# Patient Record
Sex: Female | Born: 1985
Health system: Southern US, Community
[De-identification: ages and names within clinical notes are randomized; demographics above are authoritative.]

## PROBLEM LIST (undated history)

## (undated) ENCOUNTER — Inpatient Hospital Stay (HOSPITAL_COMMUNITY): Payer: Self-pay

## (undated) DIAGNOSIS — Z87898 Personal history of other specified conditions: Secondary | ICD-10-CM

## (undated) DIAGNOSIS — R011 Cardiac murmur, unspecified: Secondary | ICD-10-CM

## (undated) DIAGNOSIS — Z8759 Personal history of other complications of pregnancy, childbirth and the puerperium: Secondary | ICD-10-CM

## (undated) DIAGNOSIS — Z8659 Personal history of other mental and behavioral disorders: Secondary | ICD-10-CM

## (undated) DIAGNOSIS — Z8742 Personal history of other diseases of the female genital tract: Secondary | ICD-10-CM

## (undated) HISTORY — PX: WISDOM TOOTH EXTRACTION: SHX21

## (undated) HISTORY — PX: DILATION AND CURETTAGE OF UTERUS: SHX78

---
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
## 2005-07-16 ENCOUNTER — Other Ambulatory Visit: Admission: RE | Admit: 2005-07-16 | Discharge: 2005-07-16 | Payer: Self-pay | Admitting: Obstetrics and Gynecology

## 2005-11-02 ENCOUNTER — Inpatient Hospital Stay (HOSPITAL_COMMUNITY): Admission: AD | Admit: 2005-11-02 | Discharge: 2005-11-02 | Payer: Self-pay | Admitting: Obstetrics and Gynecology

## 2005-12-15 ENCOUNTER — Inpatient Hospital Stay (HOSPITAL_COMMUNITY): Admission: AD | Admit: 2005-12-15 | Discharge: 2005-12-15 | Payer: Self-pay | Admitting: Obstetrics and Gynecology

## 2006-01-14 ENCOUNTER — Inpatient Hospital Stay (HOSPITAL_COMMUNITY): Admission: AD | Admit: 2006-01-14 | Discharge: 2006-01-14 | Payer: Self-pay | Admitting: Obstetrics and Gynecology

## 2006-01-20 ENCOUNTER — Inpatient Hospital Stay (HOSPITAL_COMMUNITY): Admission: AD | Admit: 2006-01-20 | Discharge: 2006-01-20 | Payer: Self-pay | Admitting: Obstetrics and Gynecology

## 2006-02-03 ENCOUNTER — Inpatient Hospital Stay (HOSPITAL_COMMUNITY): Admission: AD | Admit: 2006-02-03 | Discharge: 2006-02-06 | Payer: Self-pay | Admitting: Obstetrics and Gynecology

## 2006-02-07 ENCOUNTER — Encounter: Admission: RE | Admit: 2006-02-07 | Discharge: 2006-02-22 | Payer: Self-pay | Admitting: Obstetrics and Gynecology

## 2009-09-07 ENCOUNTER — Inpatient Hospital Stay (HOSPITAL_COMMUNITY): Admission: AD | Admit: 2009-09-07 | Discharge: 2009-09-07 | Payer: Self-pay | Admitting: Obstetrics and Gynecology

## 2009-09-07 ENCOUNTER — Ambulatory Visit: Payer: Self-pay | Admitting: Physician Assistant

## 2009-10-16 ENCOUNTER — Inpatient Hospital Stay (HOSPITAL_COMMUNITY): Admission: AD | Admit: 2009-10-16 | Discharge: 2009-10-17 | Payer: Self-pay | Admitting: Obstetrics & Gynecology

## 2009-10-24 ENCOUNTER — Inpatient Hospital Stay (HOSPITAL_COMMUNITY): Admission: AD | Admit: 2009-10-24 | Discharge: 2009-10-26 | Payer: Self-pay | Admitting: Obstetrics and Gynecology

## 2010-07-05 LAB — CBC
HCT: 26.4 % — ABNORMAL LOW (ref 36.0–46.0)
HCT: 30.6 % — ABNORMAL LOW (ref 36.0–46.0)
Hemoglobin: 9.7 g/dL — ABNORMAL LOW (ref 12.0–15.0)
MCH: 28.4 pg (ref 26.0–34.0)
MCH: 29.6 pg (ref 26.0–34.0)
MCHC: 31.8 g/dL (ref 30.0–36.0)
MCHC: 34.1 g/dL (ref 30.0–36.0)
MCV: 86.8 fL (ref 78.0–100.0)
MCV: 89.1 fL (ref 78.0–100.0)
Platelets: 281 10*3/uL (ref 150–400)
RDW: 14.4 % (ref 11.5–15.5)
RDW: 14.6 % (ref 11.5–15.5)

## 2010-07-05 LAB — SYPHILIS: RPR W/REFLEX TO RPR TITER AND TREPONEMAL ANTIBODIES, TRADITIONAL SCREENING AND DIAGNOSIS ALGORITHM: RPR Ser Ql: NONREACTIVE

## 2010-07-06 LAB — URINALYSIS, ROUTINE W REFLEX MICROSCOPIC
Bilirubin Urine: NEGATIVE
Hgb urine dipstick: NEGATIVE
Ketones, ur: NEGATIVE mg/dL
Protein, ur: NEGATIVE mg/dL
Specific Gravity, Urine: 1.01 (ref 1.005–1.030)
Urobilinogen, UA: 0.2 mg/dL (ref 0.0–1.0)

## 2013-03-02 LAB — OB RESULTS CONSOLE GC/CHLAMYDIA
CHLAMYDIA, DNA PROBE: NEGATIVE
Gonorrhea: NEGATIVE

## 2013-03-02 LAB — OB RESULTS CONSOLE ANTIBODY SCREEN: ANTIBODY SCREEN: NEGATIVE

## 2013-03-02 LAB — OB RESULTS CONSOLE ABO/RH: RH TYPE: POSITIVE

## 2013-03-02 LAB — OB RESULTS CONSOLE HIV ANTIBODY (ROUTINE TESTING): HIV: NONREACTIVE

## 2013-03-02 LAB — OB RESULTS CONSOLE HEPATITIS B SURFACE ANTIGEN: Hepatitis B Surface Ag: NEGATIVE

## 2013-03-02 LAB — OB RESULTS CONSOLE RUBELLA ANTIBODY, IGM: Rubella: IMMUNE

## 2013-03-02 LAB — OB RESULTS CONSOLE RPR: RPR: NONREACTIVE

## 2013-04-19 NOTE — L&D Delivery Note (Signed)
Delivery Note At 9:10 AM a viable female was delivered via Vaginal, Spontaneous Delivery. Pushed x 3 . apgars 9 9 weight pending Placenta status:spontaneously with 3 vessel cord , .  Cord:  with the following complications:none .    Anesthesia: Epidural  Episiotomy: None Lacerations: none Suture Repair: not applicable Est. Blood Loss (mL): 300  Mom to postpartum.  Baby to Couplet care / Skin to Skin.  GREWAL,MICHELLE L 10/02/2013, 9:19 AM

## 2013-09-11 LAB — OB RESULTS CONSOLE GBS: STREP GROUP B AG: POSITIVE

## 2013-09-21 NOTE — H&P (Signed)
Kaitlyn Sawyer is a 28 year old G 3 P 2002 [facility] 10/08/2013 presents for C Section secondary to Breech History of GBBS + . History OB History   No data available     No past medical history on file. No past surgical history on file. Family History: family history is not on file. Social History:  has no tobacco, alcohol, and drug history on file.   Prenatal Transfer Tool  Maternal Diabetes: No Genetic Screening: Normal Maternal Ultrasounds/Referrals: Normal Fetal Ultrasounds or other Referrals:  None Maternal Substance Abuse:  No Significant Maternal Medications:  None Significant Maternal Lab Results:  None Other Comments:  None  ROS    There were no vitals taken for this visit. Maternal Exam:  Introitus: not evaluated.     Physical Exam  Nursing note and vitals reviewed. Constitutional: She appears well-developed.  HENT:  Head: Normocephalic.  Eyes: Pupils are equal, round, and reactive to light.  Neck: Normal range of motion.  Cardiovascular: Normal rate.   Respiratory: Effort normal.  GI: Soft.  Genitourinary: Vagina normal.    Prenatal labs: ABO, Rh:   Antibody:   Rubella:   RPR:    HBsAg:    HIV:    GBS:     Assessment/Plan: IUP at term Breech Primary LTCS   Kaitlyn Sawyer 09/21/2013, 8:30 AM

## 2013-09-22 ENCOUNTER — Encounter (HOSPITAL_COMMUNITY): Payer: Self-pay | Admitting: *Deleted

## 2013-09-22 ENCOUNTER — Inpatient Hospital Stay (HOSPITAL_COMMUNITY)
Admission: AD | Admit: 2013-09-22 | Discharge: 2013-09-22 | Disposition: A | Discharge status: Home / Self Care | Payer: Medicaid Other | Source: Ambulatory Visit | Attending: Obstetrics and Gynecology | Admitting: Obstetrics and Gynecology

## 2013-09-22 DIAGNOSIS — O321XX0 Maternal care for breech presentation, not applicable or unspecified: Secondary | ICD-10-CM | POA: Insufficient documentation

## 2013-09-22 DIAGNOSIS — O479 False labor, unspecified: Secondary | ICD-10-CM | POA: Insufficient documentation

## 2013-09-22 DIAGNOSIS — Z87891 Personal history of nicotine dependence: Secondary | ICD-10-CM | POA: Insufficient documentation

## 2013-09-22 NOTE — MAU Note (Signed)
Contractions since this morning. Were every , thinks they might be closer. No bleeding or leaking.  No problems with preg.  Was closed when last checked.

## 2013-09-25 ENCOUNTER — Encounter (HOSPITAL_COMMUNITY): Payer: Self-pay | Admitting: *Deleted

## 2013-09-25 ENCOUNTER — Telehealth (HOSPITAL_COMMUNITY): Payer: Self-pay | Admitting: *Deleted

## 2013-09-25 NOTE — Telephone Encounter (Signed)
Preadmission screen  

## 2013-10-01 ENCOUNTER — Inpatient Hospital Stay (HOSPITAL_COMMUNITY): Payer: Medicaid Other

## 2013-10-01 ENCOUNTER — Inpatient Hospital Stay (HOSPITAL_COMMUNITY)
Admission: RE | Admit: 2013-10-01 | Discharge: 2013-10-04 | DRG: 775 | Disposition: A | Discharge status: Home / Self Care | Payer: Medicaid Other | Source: Ambulatory Visit | Attending: Obstetrics and Gynecology | Admitting: Obstetrics and Gynecology

## 2013-10-01 ENCOUNTER — Inpatient Hospital Stay (HOSPITAL_COMMUNITY): Admission: RE | Admit: 2013-10-01 | Payer: Self-pay | Source: Ambulatory Visit

## 2013-10-01 ENCOUNTER — Encounter (HOSPITAL_COMMUNITY): Payer: Self-pay

## 2013-10-01 DIAGNOSIS — O99892 Other specified diseases and conditions complicating childbirth: Principal | ICD-10-CM | POA: Diagnosis present

## 2013-10-01 DIAGNOSIS — Z3490 Encounter for supervision of normal pregnancy, unspecified, unspecified trimester: Secondary | ICD-10-CM

## 2013-10-01 DIAGNOSIS — Z87891 Personal history of nicotine dependence: Secondary | ICD-10-CM

## 2013-10-01 DIAGNOSIS — Z22330 Carrier of Group B streptococcus: Secondary | ICD-10-CM

## 2013-10-01 DIAGNOSIS — Z823 Family history of stroke: Secondary | ICD-10-CM

## 2013-10-01 DIAGNOSIS — Z880 Allergy status to penicillin: Secondary | ICD-10-CM

## 2013-10-01 DIAGNOSIS — O9989 Other specified diseases and conditions complicating pregnancy, childbirth and the puerperium: Principal | ICD-10-CM

## 2013-10-01 LAB — CBC
HCT: 32.5 % — ABNORMAL LOW (ref 36.0–46.0)
Hemoglobin: 10.8 g/dL — ABNORMAL LOW (ref 12.0–15.0)
MCH: 28.8 pg (ref 26.0–34.0)
MCHC: 33.2 g/dL (ref 30.0–36.0)
MCV: 86.7 fL (ref 78.0–100.0)
PLATELETS: 308 10*3/uL (ref 150–400)
RBC: 3.75 MIL/uL — AB (ref 3.87–5.11)
RDW: 13 % (ref 11.5–15.5)
WBC: 14.4 10*3/uL — ABNORMAL HIGH (ref 4.0–10.5)

## 2013-10-01 MED ORDER — VANCOMYCIN HCL IN DEXTROSE 1-5 GM/200ML-% IV SOLN
1000.0000 mg | Freq: Two times a day (BID) | INTRAVENOUS | Status: DC
  Administered 2013-10-02: 1000 mg via INTRAVENOUS
  Filled 2013-10-01 (×2): qty 200

## 2013-10-01 MED ORDER — TERBUTALINE SULFATE 1 MG/ML IJ SOLN: 0.2500 mg | Freq: Once | INTRAMUSCULAR | Status: AC | PRN

## 2013-10-01 MED ORDER — OXYTOCIN BOLUS FROM INFUSION
500.0000 mL | INTRAVENOUS | Status: DC
Start: 2013-10-01 — End: 2013-10-02

## 2013-10-01 MED ORDER — FLEET ENEMA 7-19 GM/118ML RE ENEM
1.0000 | ENEMA | RECTAL | Status: DC | PRN
Start: 2013-10-01 — End: 2013-10-02

## 2013-10-01 MED ORDER — ZOLPIDEM TARTRATE 5 MG PO TABS
5.0000 mg | ORAL_TABLET | Freq: Every evening | ORAL | Status: DC | PRN
  Administered 2013-10-01: 5 mg via ORAL
  Filled 2013-10-01: qty 1

## 2013-10-01 MED ORDER — ONDANSETRON HCL 4 MG/2ML IJ SOLN: 4.0000 mg | Freq: Four times a day (QID) | INTRAMUSCULAR | Status: DC | PRN

## 2013-10-01 MED ORDER — LIDOCAINE HCL (PF) 1 % IJ SOLN
30.0000 mL | INTRAMUSCULAR | Status: DC | PRN
  Filled 2013-10-01: qty 30

## 2013-10-01 MED ORDER — LACTATED RINGERS IV SOLN
500.0000 mL | INTRAVENOUS | Status: DC | PRN
Start: 2013-10-01 — End: 2013-10-02

## 2013-10-01 MED ORDER — IBUPROFEN 600 MG PO TABS
600.0000 mg | ORAL_TABLET | Freq: Four times a day (QID) | ORAL | Status: DC | PRN
  Administered 2013-10-02: 600 mg via ORAL
  Filled 2013-10-01: qty 1

## 2013-10-01 MED ORDER — VANCOMYCIN HCL IN DEXTROSE 1-5 GM/200ML-% IV SOLN
1000.0000 mg | Freq: Two times a day (BID) | INTRAVENOUS | Status: DC
  Filled 2013-10-01: qty 200

## 2013-10-01 MED ORDER — BUTORPHANOL TARTRATE 1 MG/ML IJ SOLN
1.0000 mg | INTRAMUSCULAR | Status: DC | PRN
  Administered 2013-10-02: 1 mg via INTRAVENOUS
  Filled 2013-10-01: qty 1

## 2013-10-01 MED ORDER — LACTATED RINGERS IV SOLN
INTRAVENOUS | Status: DC
  Administered 2013-10-01 – 2013-10-02 (×2): via INTRAVENOUS

## 2013-10-01 MED ORDER — OXYTOCIN 40 UNITS IN LACTATED RINGERS INFUSION - SIMPLE MED: 62.5000 mL/h | INTRAVENOUS | Status: DC

## 2013-10-01 MED ORDER — MISOPROSTOL 25 MCG QUARTER TABLET
25.0000 ug | ORAL_TABLET | ORAL | Status: DC | PRN
Start: 2013-10-01 — End: 2013-10-02
  Administered 2013-10-01: 25 ug via VAGINAL
  Filled 2013-10-01: qty 0.25
  Filled 2013-10-01: qty 1

## 2013-10-01 MED ORDER — OXYCODONE-ACETAMINOPHEN 5-325 MG PO TABS: 1.0000 | ORAL_TABLET | ORAL | Status: DC | PRN

## 2013-10-01 MED ORDER — ACETAMINOPHEN 325 MG PO TABS: 650.0000 mg | ORAL_TABLET | ORAL | Status: DC | PRN

## 2013-10-01 MED ORDER — CITRIC ACID-SODIUM CITRATE 334-500 MG/5ML PO SOLN: 30.0000 mL | ORAL | Status: DC | PRN

## 2013-10-01 NOTE — Progress Notes (Signed)
ANTIBIOTIC CONSULT NOTE - INITIAL  Pharmacy Consult for Vancomycin  Indication: GBS positive w/ Penicillin allergy  Allergies  Allergen Reactions  . Penicillins Swelling and Rash    Patient Measurements: Height: 5\' 3"  (160 cm) Weight: 156 lb (70.761 kg) IBW/kg (Calculated) : 52.4   Vital Signs: Temp: 98.5 F (36.9 C) (06/15 1947) Temp src: Oral (06/15 1947) BP: 123/70 mmHg (06/15 1947) Pulse Rate: 92 (06/15 1947) Intake/Output from previous day:   Intake/Output from this shift:    Labs: No results found for this basename: WBC, HGB, PLT, LABCREA, CREATININE,  in the last 72 hours CrCl is unknown because no creatinine reading has been taken. No results found for this basename: VANCOTROUGH, Leodis BinetVANCOPEAK, VANCORANDOM, GENTTROUGH, GENTPEAK, GENTRANDOM, TOBRATROUGH, TOBRAPEAK, TOBRARND, AMIKACINPEAK, AMIKACINTROU, AMIKACIN,  in the last 72 hours   Microbiology: Recent Results (from the past 720 hour(s))  OB RESULTS CONSOLE GBS     Status: None   Collection Time    09/11/13 12:00 AM      Result Value Ref Range Status   GBS Positive   Final    Medical History: Past Medical History  Diagnosis Date  . Vaginal Pap smear, abnormal     cryo- HPV  . Pregnancy induced hypertension     PP eclamptia #2  . Anxiety     hx of meds, none currently  . Depression     hx pp  . Eclampsia, postpartum     Medications:   Assessment: 28 yo admitted for IOL with positive GBS status w/ Penicillin allergy   Plan:  1. Vancomycin 1 gram IV q12h (GBS positive regimen) 2. No further follow up/dose adjustment necessary. Thanks!  Claybon Jabsngel, Kelson Queenan G 10/01/2013,9:17 PM

## 2013-10-02 ENCOUNTER — Inpatient Hospital Stay (HOSPITAL_COMMUNITY): Payer: Medicaid Other | Admitting: Anesthesiology

## 2013-10-02 ENCOUNTER — Encounter (HOSPITAL_COMMUNITY): Payer: Self-pay

## 2013-10-02 ENCOUNTER — Encounter (HOSPITAL_COMMUNITY): Payer: Medicaid Other | Admitting: Anesthesiology

## 2013-10-02 LAB — RPR

## 2013-10-02 SURGERY — Surgical Case
Anesthesia: Regional

## 2013-10-02 MED ORDER — TETANUS-DIPHTH-ACELL PERTUSSIS 5-2.5-18.5 LF-MCG/0.5 IM SUSP: 0.5000 mL | Freq: Once | INTRAMUSCULAR | Status: DC

## 2013-10-02 MED ORDER — LACTATED RINGERS IV SOLN: 500.0000 mL | Freq: Once | INTRAVENOUS | Status: DC

## 2013-10-02 MED ORDER — PRENATAL MULTIVITAMIN CH
1.0000 | ORAL_TABLET | Freq: Every day | ORAL | Status: DC
  Administered 2013-10-03: 1 via ORAL
  Filled 2013-10-02: qty 1

## 2013-10-02 MED ORDER — EPHEDRINE 5 MG/ML INJ
10.0000 mg | INTRAVENOUS | Status: DC | PRN
  Filled 2013-10-02: qty 2

## 2013-10-02 MED ORDER — DIPHENHYDRAMINE HCL 50 MG/ML IJ SOLN: 12.5000 mg | INTRAMUSCULAR | Status: DC | PRN

## 2013-10-02 MED ORDER — IBUPROFEN 600 MG PO TABS
600.0000 mg | ORAL_TABLET | Freq: Four times a day (QID) | ORAL | Status: DC
  Administered 2013-10-02 – 2013-10-04 (×8): 600 mg via ORAL
  Filled 2013-10-02 (×7): qty 1

## 2013-10-02 MED ORDER — BISACODYL 10 MG RE SUPP: 10.0000 mg | Freq: Every day | RECTAL | Status: DC | PRN

## 2013-10-02 MED ORDER — DIBUCAINE 1 % RE OINT: 1.0000 "application " | TOPICAL_OINTMENT | RECTAL | Status: DC | PRN

## 2013-10-02 MED ORDER — FLEET ENEMA 7-19 GM/118ML RE ENEM: 1.0000 | ENEMA | Freq: Every day | RECTAL | Status: DC | PRN

## 2013-10-02 MED ORDER — OXYTOCIN 40 UNITS IN LACTATED RINGERS INFUSION - SIMPLE MED
1.0000 m[IU]/min | INTRAVENOUS | Status: DC
  Filled 2013-10-02: qty 1000

## 2013-10-02 MED ORDER — LANOLIN HYDROUS EX OINT: TOPICAL_OINTMENT | CUTANEOUS | Status: DC | PRN

## 2013-10-02 MED ORDER — ONDANSETRON HCL 4 MG PO TABS: 4.0000 mg | ORAL_TABLET | ORAL | Status: DC | PRN

## 2013-10-02 MED ORDER — ZOLPIDEM TARTRATE 5 MG PO TABS: 5.0000 mg | ORAL_TABLET | Freq: Every evening | ORAL | Status: DC | PRN

## 2013-10-02 MED ORDER — PHENYLEPHRINE 40 MCG/ML (10ML) SYRINGE FOR IV PUSH (FOR BLOOD PRESSURE SUPPORT)
80.0000 ug | PREFILLED_SYRINGE | INTRAVENOUS | Status: DC | PRN
  Filled 2013-10-02: qty 2
  Filled 2013-10-02: qty 10

## 2013-10-02 MED ORDER — BENZOCAINE-MENTHOL 20-0.5 % EX AERO
1.0000 "application " | INHALATION_SPRAY | CUTANEOUS | Status: DC | PRN
  Administered 2013-10-02: 1 via TOPICAL
  Filled 2013-10-02: qty 56

## 2013-10-02 MED ORDER — MEASLES, MUMPS & RUBELLA VAC ~~LOC~~ INJ: 0.5000 mL | INJECTION | Freq: Once | SUBCUTANEOUS | Status: DC

## 2013-10-02 MED ORDER — WITCH HAZEL-GLYCERIN EX PADS: 1.0000 "application " | MEDICATED_PAD | CUTANEOUS | Status: DC | PRN

## 2013-10-02 MED ORDER — TERBUTALINE SULFATE 1 MG/ML IJ SOLN: 0.2500 mg | Freq: Once | INTRAMUSCULAR | Status: DC | PRN

## 2013-10-02 MED ORDER — PHENYLEPHRINE 40 MCG/ML (10ML) SYRINGE FOR IV PUSH (FOR BLOOD PRESSURE SUPPORT)
80.0000 ug | PREFILLED_SYRINGE | INTRAVENOUS | Status: DC | PRN
  Filled 2013-10-02: qty 2

## 2013-10-02 MED ORDER — ONDANSETRON HCL 4 MG/2ML IJ SOLN: 4.0000 mg | INTRAMUSCULAR | Status: DC | PRN

## 2013-10-02 MED ORDER — EPHEDRINE 5 MG/ML INJ
10.0000 mg | INTRAVENOUS | Status: DC | PRN
  Filled 2013-10-02: qty 2
  Filled 2013-10-02: qty 4

## 2013-10-02 MED ORDER — SENNOSIDES-DOCUSATE SODIUM 8.6-50 MG PO TABS
2.0000 | ORAL_TABLET | ORAL | Status: DC
  Administered 2013-10-02 – 2013-10-03 (×2): 2 via ORAL
  Filled 2013-10-02 (×2): qty 2

## 2013-10-02 MED ORDER — MEDROXYPROGESTERONE ACETATE 150 MG/ML IM SUSP: 150.0000 mg | INTRAMUSCULAR | Status: DC | PRN

## 2013-10-02 MED ORDER — IBUPROFEN 600 MG PO TABS: 600.0000 mg | ORAL_TABLET | Freq: Four times a day (QID) | ORAL | Status: DC

## 2013-10-02 MED ORDER — FENTANYL 2.5 MCG/ML BUPIVACAINE 1/10 % EPIDURAL INFUSION (WH - ANES): 14.0000 mL/h | INTRAMUSCULAR | Status: DC | PRN

## 2013-10-02 MED ORDER — FENTANYL 2.5 MCG/ML BUPIVACAINE 1/10 % EPIDURAL INFUSION (WH - ANES)
14.0000 mL/h | INTRAMUSCULAR | Status: DC | PRN
  Administered 2013-10-02: 14 mL/h via EPIDURAL
  Filled 2013-10-02: qty 125

## 2013-10-02 MED ORDER — OXYCODONE-ACETAMINOPHEN 5-325 MG PO TABS: 1.0000 | ORAL_TABLET | ORAL | Status: DC | PRN

## 2013-10-02 MED ORDER — LIDOCAINE HCL (PF) 1 % IJ SOLN
INTRAMUSCULAR | Status: DC | PRN
  Administered 2013-10-02 (×2): 5 mL

## 2013-10-02 MED ORDER — DIPHENHYDRAMINE HCL 25 MG PO CAPS: 25.0000 mg | ORAL_CAPSULE | Freq: Four times a day (QID) | ORAL | Status: DC | PRN

## 2013-10-02 MED ORDER — SIMETHICONE 80 MG PO CHEW: 80.0000 mg | CHEWABLE_TABLET | ORAL | Status: DC | PRN

## 2013-10-02 NOTE — H&P (Signed)
Kaitlyn Sawyer is a 28 y.o. G 3 P 2002 at 2839 w 1 day admitted last night for induction. Started on Vancomycin GBBS+ and resistant to clindamycin. Received 1 cytotec last night. Complaining of significant back pain. Maternal Medical History:  Reason for admission: Contractions.   Contractions: Frequency: regular.    Fetal activity: Perceived fetal activity is normal.    Prenatal complications: no prenatal complications   OB History   Grav Para Term Preterm Abortions TAB SAB Ect Mult Living   3 2 2  0 0 0 0 0 0 2     Past Medical History  Diagnosis Date  . Vaginal Pap smear, abnormal     cryo- HPV  . Pregnancy induced hypertension     PP eclamptia #2  . Anxiety     hx of meds, none currently  . Depression     hx pp  . Eclampsia, postpartum    Past Surgical History  Procedure Laterality Date  . Dilation and curettage of uterus      post partem retained placenta  . Cryotherapy     Family History: family history includes Alcohol abuse in her maternal grandfather; Heart disease in her paternal grandfather; Stroke in her paternal grandmother. Social History:  reports that she has quit smoking. She has never used smokeless tobacco. She reports that she does not drink alcohol or use illicit drugs.   Prenatal Transfer Tool  Maternal Diabetes: No Genetic Screening: Normal Maternal Ultrasounds/Referrals: Normal Fetal Ultrasounds or other Referrals:  None Maternal Substance Abuse:  No Significant Maternal Medications:  None Significant Maternal Lab Results:  None Other Comments:  None  Review of Systems  All other systems reviewed and are negative.   Dilation: 4 Effacement (%): 80 Station: -2 Exam by:: dr Vincente Poligrewal Blood pressure 110/50, pulse 69, temperature 98.5 F (36.9 C), temperature source Oral, resp. rate 18, height 5\' 3"  (1.6 m), weight 70.761 kg (156 lb). Maternal Exam:  Abdomen: Fetal presentation: vertex  Introitus: Normal vulva.   Fetal Exam Fetal State  Assessment: Category I - tracings are normal.     Physical Exam  Nursing note and vitals reviewed. Constitutional: She appears well-developed.  HENT:  Head: Normocephalic.  Eyes: Pupils are equal, round, and reactive to light.  Neck: Normal range of motion.  Cardiovascular: Normal rate.   Respiratory: Effort normal.  GI: Soft.    Prenatal labs: ABO, Rh: AB/Positive/-- (11/14 0000) Antibody: Negative (11/14 0000) Rubella: Immune (11/14 0000) RPR: NON REAC (06/15 2137)  HBsAg: Negative (11/14 0000)  HIV: Non-reactive (11/14 0000)  GBS: Positive (05/26 0000)   Assessment/Plan: IUP at 39 w 1 day Induction of labor per patient request Epidural  Continue vancomycin Pitocin prn GREWAL,MICHELLE L 10/02/2013, 7:42 AM

## 2013-10-02 NOTE — Anesthesia Preprocedure Evaluation (Signed)

## 2013-10-02 NOTE — Anesthesia Postprocedure Evaluation (Signed)
  Anesthesia Post-op Note  Patient: Kaitlyn Sawyer  Procedure(s) Performed: * No procedures listed *  Patient Location: Mother/Baby  Anesthesia Type:Epidural  Level of Consciousness: awake, alert  and oriented  Airway and Oxygen Therapy: Patient Spontanous Breathing  Post-op Pain: none  Post-op Assessment: Post-op Vital signs reviewed, Patient's Cardiovascular Status Stable, Respiratory Function Stable, No headache, No backache, No residual numbness and No residual motor weakness  Post-op Vital Signs: Reviewed and stable  Last Vitals:  Filed Vitals:   10/02/13 1224  BP: 106/58  Pulse: 71  Temp: 36.8 C  Resp: 18    Complications: No apparent anesthesia complications

## 2013-10-02 NOTE — Anesthesia Procedure Notes (Signed)
Epidural Patient location during procedure: OB Start time: 10/02/2013 8:39 AM  Staffing Anesthesiologist: Brayton CavesJACKSON, FREEMAN Performed by: anesthesiologist   Preanesthetic Checklist Completed: patient identified, site marked, surgical consent, pre-op evaluation, timeout performed, IV checked, risks and benefits discussed and monitors and equipment checked  Epidural Patient position: sitting Prep: site prepped and draped and DuraPrep Patient monitoring: continuous pulse ox and blood pressure Approach: midline Injection technique: LOR air  Needle:  Needle type: Tuohy  Needle gauge: 17 G Needle length: 9 cm and 9 Needle insertion depth: 5 cm cm Catheter type: closed end flexible Catheter size: 19 Gauge Catheter at skin depth: 10 cm Test dose: negative  Assessment Events: blood not aspirated, injection not painful, no injection resistance, negative IV test and no paresthesia  Additional Notes Patient identified.  Risk benefits discussed including failed block, incomplete pain control, headache, nerve damage, paralysis, blood pressure changes, nausea, vomiting, reactions to medication both toxic or allergic, and postpartum back pain.  Patient expressed understanding and wished to proceed.  All questions were answered.  Sterile technique used throughout procedure and epidural site dressed with sterile barrier dressing. No paresthesia or other complications noted.The patient did not experience any signs of intravascular injection such as tinnitus or metallic taste in mouth nor signs of intrathecal spread such as rapid motor block. Please see nursing notes for vital signs.

## 2013-10-03 LAB — CBC
HCT: 30.6 % — ABNORMAL LOW (ref 36.0–46.0)
Hemoglobin: 10.2 g/dL — ABNORMAL LOW (ref 12.0–15.0)
MCH: 29.1 pg (ref 26.0–34.0)
MCHC: 33.3 g/dL (ref 30.0–36.0)
MCV: 87.4 fL (ref 78.0–100.0)
Platelets: 274 10*3/uL (ref 150–400)
RBC: 3.5 MIL/uL — AB (ref 3.87–5.11)
RDW: 13 % (ref 11.5–15.5)
WBC: 12.1 10*3/uL — AB (ref 4.0–10.5)

## 2013-10-03 NOTE — Progress Notes (Signed)
Post Partum Day 1 Subjective: no complaints, up ad lib, voiding, tolerating PO and + flatus  Objective: Blood pressure 104/62, pulse 63, temperature 97.9 F (36.6 C), temperature source Oral, resp. rate 18, height 5\' 3"  (1.6 m), weight 156 lb (70.761 kg), SpO2 98.00%, unknown if currently breastfeeding.  Physical Exam:  General: alert and cooperative Lochia: appropriate Uterine Fundus: firm Incision: perineum intact DVT Evaluation: No evidence of DVT seen on physical exam. Negative Homan's sign. No cords or calf tenderness. No significant calf/ankle edema.   Recent Labs  10/01/13 2137 10/03/13 0602  HGB 10.8* 10.2*  HCT 32.5* 30.6*    Assessment/Plan: Plan for discharge tomorrow   LOS: 2 days   CURTIS,CAROL G 10/03/2013, 8:20 AM

## 2013-10-03 NOTE — Progress Notes (Signed)
Patient was referred for history of depression/anxiety.  * Referral screened out by Clinical Social Worker because none of the following criteria appear to apply:  ~ History of anxiety/depression during this pregnancy, or of post-partum depression.  ~ Diagnosis of anxiety and/or depression within last 3 years  ~ History of depression due to pregnancy loss/loss of child  OR  * Patient's symptoms currently being treated with medication and/or therapy.  Please contact the Clinical Social Worker if needs arise, or by the patient's request.  Pt attributes PP depression symptoms she experienced to medical complications after delivery in 2011. She denies any depressed moods prior to pregnancy or since then. Anxiety symptoms were an issue in 2008. Pt states she doing well & has good support at home.   

## 2013-10-03 NOTE — Progress Notes (Signed)
Ur chart review completed.  

## 2013-10-04 MED ORDER — IBUPROFEN 600 MG PO TABS: 600.0000 mg | ORAL_TABLET | Freq: Four times a day (QID) | ORAL | Status: DC

## 2013-10-04 NOTE — Discharge Summary (Signed)
Obstetric Discharge Summary Reason for Admission: induction of labor Prenatal Procedures: ultrasound Intrapartum Procedures: spontaneous vaginal delivery Postpartum Procedures: none Complications-Operative and Postpartum: none Hemoglobin  Date Value Ref Range Status  10/03/2013 10.2* 12.0 - 15.0 g/dL Final     HCT  Date Value Ref Range Status  10/03/2013 30.6* 36.0 - 46.0 % Final    Physical Exam:  General: alert and cooperative Lochia: appropriate Uterine Fundus: firm Incision: perineum intact DVT Evaluation: No evidence of DVT seen on physical exam. Negative Homan's sign. No cords or calf tenderness. No significant calf/ankle edema.  Discharge Diagnoses: Term Pregnancy-delivered  Discharge Information: Date: 10/04/2013 Activity: pelvic rest Diet: routine Medications: PNV and Ibuprofen Condition: stable Instructions: refer to practice specific booklet Discharge to: home   Newborn Data: Live born female  Birth Weight: 7 lb 7.8 oz (3395 g) APGAR: 9, 9  Home with mother.  CURTIS,CAROL G 10/04/2013, 8:37 AM

## 2013-10-04 NOTE — Lactation Note (Signed)
This note was copied from the chart of Kaitlyn Sawyer. Lactation Consultation Note  Patient Name: Kaitlyn Pearletha AlfredKatherine Marsee RUEAV'WToday's Date: 10/04/2013 Reason for consult: Follow-up assessment Per mom my milk is in and I just pumped off the breast the baby didn't feed off. Baby recently fed for 15 mins at 0940. And mom pumped off milk off the 2nd breast. LC reviewed sore nipple and engorgement and tx. Per mom nipples tender , using  Comfort gels with relief. Mother informed of post-discharge support and given phone number to the lactation department, including services for phone call assistance; out-patient appointments; and breastfeeding support group. List of other breastfeeding resources in the community given in the handout. Encouraged mother to call for problems or concerns related to breastfeeding.   Maternal Data Has patient been taught Hand Expression?:  (per mom has been shown )  Feeding Feeding Type:  (recently fed at 0940 ) Length of feed: 15 min (per mom )  LATCH Score/Interventions                Intervention(s): Breastfeeding basics reviewed (see LC note )     Lactation Tools Discussed/Used Tools: Pump;Comfort gels ([facility] pump [facility] mom [facility] ) Breast pump type: Manual   Consult Status Consult Status: Complete Date: 10/04/13 Follow-up type: In-patient    Kathrin Greathouseorio, Margaret Ann 10/04/2013, 10:17 AM

## 2014-02-18 ENCOUNTER — Encounter (HOSPITAL_COMMUNITY): Payer: Self-pay

## 2014-05-16 LAB — OB RESULTS CONSOLE ANTIBODY SCREEN: Antibody Screen: NEGATIVE

## 2014-05-16 LAB — OB RESULTS CONSOLE HIV ANTIBODY (ROUTINE TESTING): HIV: NONREACTIVE

## 2014-05-16 LAB — OB RESULTS CONSOLE GC/CHLAMYDIA
CHLAMYDIA, DNA PROBE: NEGATIVE
Gonorrhea: NEGATIVE

## 2014-05-16 LAB — OB RESULTS CONSOLE RUBELLA ANTIBODY, IGM: Rubella: IMMUNE

## 2014-05-16 LAB — OB RESULTS CONSOLE RPR: RPR: NONREACTIVE

## 2014-05-16 LAB — OB RESULTS CONSOLE HEPATITIS B SURFACE ANTIGEN: Hepatitis B Surface Ag: NEGATIVE

## 2014-05-16 LAB — OB RESULTS CONSOLE ABO/RH: RH Type: POSITIVE

## 2014-11-25 ENCOUNTER — Encounter (HOSPITAL_COMMUNITY): Payer: Self-pay | Admitting: *Deleted

## 2014-11-25 ENCOUNTER — Telehealth (HOSPITAL_COMMUNITY): Payer: Self-pay | Admitting: *Deleted

## 2014-11-25 LAB — OB RESULTS CONSOLE GBS: STREP GROUP B AG: POSITIVE

## 2014-11-25 NOTE — Telephone Encounter (Signed)
Preadmission screen  

## 2014-11-29 ENCOUNTER — Inpatient Hospital Stay (HOSPITAL_COMMUNITY): Admission: RE | Admit: 2014-11-29 | Payer: Medicaid Other | Source: Ambulatory Visit

## 2014-11-30 ENCOUNTER — Inpatient Hospital Stay (HOSPITAL_COMMUNITY): Payer: Medicaid Other | Admitting: Anesthesiology

## 2014-11-30 ENCOUNTER — Inpatient Hospital Stay (HOSPITAL_COMMUNITY)
Admission: RE | Admit: 2014-11-30 | Discharge: 2014-12-01 | DRG: 775 | Disposition: A | Discharge status: Home / Self Care | Payer: Medicaid Other | Source: Ambulatory Visit | Attending: Obstetrics and Gynecology | Admitting: Obstetrics and Gynecology

## 2014-11-30 ENCOUNTER — Encounter (HOSPITAL_COMMUNITY): Payer: Self-pay

## 2014-11-30 DIAGNOSIS — Z823 Family history of stroke: Secondary | ICD-10-CM

## 2014-11-30 DIAGNOSIS — Z3490 Encounter for supervision of normal pregnancy, unspecified, unspecified trimester: Secondary | ICD-10-CM

## 2014-11-30 DIAGNOSIS — O99824 Streptococcus B carrier state complicating childbirth: Secondary | ICD-10-CM | POA: Diagnosis present

## 2014-11-30 DIAGNOSIS — Z3A39 39 weeks gestation of pregnancy: Secondary | ICD-10-CM | POA: Diagnosis present

## 2014-11-30 DIAGNOSIS — Z87891 Personal history of nicotine dependence: Secondary | ICD-10-CM

## 2014-11-30 DIAGNOSIS — Z8249 Family history of ischemic heart disease and other diseases of the circulatory system: Secondary | ICD-10-CM

## 2014-11-30 LAB — CBC
HEMATOCRIT: 28 % — AB (ref 36.0–46.0)
HEMOGLOBIN: 9 g/dL — AB (ref 12.0–15.0)
MCH: 26.1 pg (ref 26.0–34.0)
MCHC: 32.1 g/dL (ref 30.0–36.0)
MCV: 81.2 fL (ref 78.0–100.0)
PLATELETS: 323 10*3/uL (ref 150–400)
RBC: 3.45 MIL/uL — AB (ref 3.87–5.11)
RDW: 13.9 % (ref 11.5–15.5)
WBC: 10.5 10*3/uL (ref 4.0–10.5)

## 2014-11-30 LAB — TYPE AND SCREEN
ABO/RH(D): AB POS
ANTIBODY SCREEN: NEGATIVE

## 2014-11-30 LAB — ABO/RH: ABO/RH(D): AB POS

## 2014-11-30 LAB — RPR: RPR: NONREACTIVE

## 2014-11-30 MED ORDER — DIBUCAINE 1 % RE OINT: 1.0000 "application " | TOPICAL_OINTMENT | RECTAL | Status: DC | PRN

## 2014-11-30 MED ORDER — ONDANSETRON HCL 4 MG PO TABS: 4.0000 mg | ORAL_TABLET | ORAL | Status: DC | PRN

## 2014-11-30 MED ORDER — CITRIC ACID-SODIUM CITRATE 334-500 MG/5ML PO SOLN: 30.0000 mL | ORAL | Status: DC | PRN

## 2014-11-30 MED ORDER — PHENYLEPHRINE 40 MCG/ML (10ML) SYRINGE FOR IV PUSH (FOR BLOOD PRESSURE SUPPORT)
PREFILLED_SYRINGE | INTRAVENOUS | Status: AC
  Filled 2014-11-30: qty 20

## 2014-11-30 MED ORDER — MEASLES, MUMPS & RUBELLA VAC ~~LOC~~ INJ
0.5000 mL | INJECTION | Freq: Once | SUBCUTANEOUS | Status: DC
  Filled 2014-11-30: qty 0.5

## 2014-11-30 MED ORDER — ONDANSETRON HCL 4 MG/2ML IJ SOLN
4.0000 mg | INTRAMUSCULAR | Status: DC | PRN
Start: 2014-11-30 — End: 2014-12-01

## 2014-11-30 MED ORDER — FLEET ENEMA 7-19 GM/118ML RE ENEM: 1.0000 | ENEMA | RECTAL | Status: DC | PRN

## 2014-11-30 MED ORDER — SIMETHICONE 80 MG PO CHEW: 80.0000 mg | CHEWABLE_TABLET | ORAL | Status: DC | PRN

## 2014-11-30 MED ORDER — ACETAMINOPHEN 325 MG PO TABS: 650.0000 mg | ORAL_TABLET | ORAL | Status: DC | PRN

## 2014-11-30 MED ORDER — LIDOCAINE HCL (PF) 1 % IJ SOLN
30.0000 mL | INTRAMUSCULAR | Status: DC | PRN
  Filled 2014-11-30: qty 30

## 2014-11-30 MED ORDER — OXYCODONE-ACETAMINOPHEN 5-325 MG PO TABS: 1.0000 | ORAL_TABLET | ORAL | Status: DC | PRN

## 2014-11-30 MED ORDER — PHENYLEPHRINE 40 MCG/ML (10ML) SYRINGE FOR IV PUSH (FOR BLOOD PRESSURE SUPPORT)
80.0000 ug | PREFILLED_SYRINGE | INTRAVENOUS | Status: DC | PRN
  Filled 2014-11-30: qty 2

## 2014-11-30 MED ORDER — FENTANYL 2.5 MCG/ML BUPIVACAINE 1/10 % EPIDURAL INFUSION (WH - ANES): 14.0000 mL/h | INTRAMUSCULAR | Status: DC | PRN

## 2014-11-30 MED ORDER — METHYLERGONOVINE MALEATE 0.2 MG/ML IJ SOLN
0.2000 mg | Freq: Once | INTRAMUSCULAR | Status: AC
  Administered 2014-11-30: 0.2 mg via INTRAMUSCULAR

## 2014-11-30 MED ORDER — WITCH HAZEL-GLYCERIN EX PADS: 1.0000 "application " | MEDICATED_PAD | CUTANEOUS | Status: DC | PRN

## 2014-11-30 MED ORDER — OXYCODONE-ACETAMINOPHEN 5-325 MG PO TABS: 2.0000 | ORAL_TABLET | ORAL | Status: DC | PRN

## 2014-11-30 MED ORDER — DIPHENHYDRAMINE HCL 50 MG/ML IJ SOLN: 12.5000 mg | INTRAMUSCULAR | Status: DC | PRN

## 2014-11-30 MED ORDER — BENZOCAINE-MENTHOL 20-0.5 % EX AERO: 1.0000 "application " | INHALATION_SPRAY | CUTANEOUS | Status: DC | PRN

## 2014-11-30 MED ORDER — ONDANSETRON HCL 4 MG/2ML IJ SOLN: 4.0000 mg | Freq: Four times a day (QID) | INTRAMUSCULAR | Status: DC | PRN

## 2014-11-30 MED ORDER — METHYLERGONOVINE MALEATE 0.2 MG/ML IJ SOLN
INTRAMUSCULAR | Status: AC
  Filled 2014-11-30: qty 1

## 2014-11-30 MED ORDER — EPHEDRINE 5 MG/ML INJ
10.0000 mg | INTRAVENOUS | Status: DC | PRN
  Filled 2014-11-30: qty 2

## 2014-11-30 MED ORDER — LANOLIN HYDROUS EX OINT: TOPICAL_OINTMENT | CUTANEOUS | Status: DC | PRN

## 2014-11-30 MED ORDER — MEDROXYPROGESTERONE ACETATE 150 MG/ML IM SUSP: 150.0000 mg | INTRAMUSCULAR | Status: DC | PRN

## 2014-11-30 MED ORDER — FENTANYL 2.5 MCG/ML BUPIVACAINE 1/10 % EPIDURAL INFUSION (WH - ANES)
INTRAMUSCULAR | Status: AC
  Filled 2014-11-30: qty 125

## 2014-11-30 MED ORDER — OXYTOCIN 40 UNITS IN LACTATED RINGERS INFUSION - SIMPLE MED
1.0000 m[IU]/min | INTRAVENOUS | Status: DC
  Administered 2014-11-30: 2 m[IU]/min via INTRAVENOUS
  Filled 2014-11-30: qty 1000

## 2014-11-30 MED ORDER — LACTATED RINGERS IV SOLN
INTRAVENOUS | Status: DC
  Administered 2014-11-30: 1000 mL via INTRAVENOUS

## 2014-11-30 MED ORDER — OXYTOCIN 40 UNITS IN LACTATED RINGERS INFUSION - SIMPLE MED
62.5000 mL/h | INTRAVENOUS | Status: DC
Start: 2014-11-30 — End: 2014-11-30
  Administered 2014-11-30: 500 mL/h via INTRAVENOUS

## 2014-11-30 MED ORDER — IBUPROFEN 600 MG PO TABS
600.0000 mg | ORAL_TABLET | Freq: Four times a day (QID) | ORAL | Status: DC
  Administered 2014-11-30 – 2014-12-01 (×4): 600 mg via ORAL
  Filled 2014-11-30 (×4): qty 1

## 2014-11-30 MED ORDER — VANCOMYCIN HCL IN DEXTROSE 1-5 GM/200ML-% IV SOLN
1000.0000 mg | Freq: Two times a day (BID) | INTRAVENOUS | Status: DC
  Administered 2014-11-30: 1000 mg via INTRAVENOUS
  Filled 2014-11-30 (×2): qty 200

## 2014-11-30 MED ORDER — ZOLPIDEM TARTRATE 5 MG PO TABS: 5.0000 mg | ORAL_TABLET | Freq: Every evening | ORAL | Status: DC | PRN

## 2014-11-30 MED ORDER — TERBUTALINE SULFATE 1 MG/ML IJ SOLN: 0.2500 mg | Freq: Once | INTRAMUSCULAR | Status: DC | PRN

## 2014-11-30 MED ORDER — TETANUS-DIPHTH-ACELL PERTUSSIS 5-2.5-18.5 LF-MCG/0.5 IM SUSP: 0.5000 mL | Freq: Once | INTRAMUSCULAR | Status: DC

## 2014-11-30 MED ORDER — SENNOSIDES-DOCUSATE SODIUM 8.6-50 MG PO TABS
2.0000 | ORAL_TABLET | ORAL | Status: DC
  Administered 2014-12-01: 2 via ORAL
  Filled 2014-11-30: qty 2

## 2014-11-30 MED ORDER — OXYTOCIN BOLUS FROM INFUSION: 500.0000 mL | INTRAVENOUS | Status: DC

## 2014-11-30 MED ORDER — LACTATED RINGERS IV SOLN
500.0000 mL | INTRAVENOUS | Status: DC | PRN
  Administered 2014-11-30: 500 mL via INTRAVENOUS

## 2014-11-30 MED ORDER — PRENATAL MULTIVITAMIN CH
1.0000 | ORAL_TABLET | Freq: Every day | ORAL | Status: DC
  Administered 2014-12-01: 1 via ORAL
  Filled 2014-11-30: qty 1

## 2014-11-30 NOTE — Progress Notes (Signed)
Pt comfortable.  S/p Vanc  FHT cat 1 Toco Q5 Cvx 3/60/-2, unchanged, vts AROM, clear  A/P:  Continue exp mngt Epidural prn

## 2014-11-30 NOTE — H&P (Signed)
Kaitlyn Sawyer is a 29 y.o. female G4P3 @ 39+ weeks presenting for IOL.  Pregnancy uncomplicated.  Cervix favorable. History OB History    Gravida Para Term Preterm AB TAB SAB Ectopic Multiple Living   0 0 0 0 0 0 3     Past Medical History  Diagnosis Date  . Vaginal Pap smear, abnormal     cryo- HPV  . Pregnancy induced hypertension     PP eclamptia #2  . Anxiety     hx of meds, none currently  . Depression     hx pp  . Eclampsia, postpartum    Past Surgical History  Procedure Laterality Date  . Dilation and curettage of uterus      post partem retained placenta  . Cryotherapy     Family History: family history includes Alcohol abuse in her maternal grandfather; Heart disease in her paternal grandfather; Stroke in her paternal grandmother. Social History:  reports that she has quit smoking. She has never used smokeless tobacco. She reports that she does not drink alcohol or use illicit drugs.   Prenatal Transfer Tool  Maternal Diabetes: No Genetic Screening: Declined Maternal Ultrasounds/Referrals: Normal Fetal Ultrasounds or other Referrals:  None Maternal Substance Abuse:  No Significant Maternal Medications:  None Significant Maternal Lab Results:  None Other Comments:  None  ROS    Last menstrual period 03/01/2014, unknown if currently breastfeeding. Exam Physical Exam  Prenatal labs: ABO, Rh: AB/Positive/-- (01/28 0000) Antibody: Negative (01/28 0000) Rubella: Immune (01/28 0000) RPR: Nonreactive (01/28 0000)  HBsAg: Negative (01/28 0000)  HIV: Non-reactive (01/28 0000)  GBS: Positive (08/08 0000)   Assessment/Plan: Admit Pitocin vanc for GBS prophylaxis   Aralynn Brake 11/30/2014, 7:45 AM

## 2014-11-30 NOTE — Lactation Note (Signed)
This note was copied from the chart of Kaitlyn Shernell Saldierna. Lactation Consultation Note  Patient Name: Kaitlyn Sawyer [id number] Date: 11/30/2014 Reason for consult: Initial assessment Baby 5 hours old. Mom states that she was having some trouble latching baby until last feeding. Mom states that she had trouble with supply on right side last time as well. Discussed attempting football position on right breast and alternating breast at the beginning of each feed to enc a good supply in each breast. Mom states that she has been able to hand express colostrum. Enc mom to call for assistance as needed. Mom given [facility] brochure, aware of OP/BFSG, and LC phone line assistance after D/C.   Maternal Data Has patient been taught Hand Expression?: Yes (Per mom.) Does the patient have breastfeeding experience prior to this delivery?: Yes  Feeding    LATCH Score/Interventions                      Lactation Tools Discussed/Used     Consult Status Consult Status: Follow-up Date: 12/01/14 Follow-up type: In-patient    [name] 11/30/2014, 6:33 PM

## 2014-11-30 NOTE — Anesthesia Preprocedure Evaluation (Signed)
Anesthesia Evaluation  Patient identified by MRN, date of birth, ID band Patient awake    Reviewed: Allergy & Precautions, H&P , NPO status , Patient's Chart, lab work & pertinent test results  Airway Mallampati: I  TM Distance: >3 FB Neck ROM: full    Dental no notable dental hx.    Pulmonary former smoker,    Pulmonary exam normal       Cardiovascular hypertension, Normal cardiovascular exam    Neuro/Psych negative neurological ROS     GI/Hepatic negative GI ROS, Neg liver ROS,   Endo/Other  negative endocrine ROS  Renal/GU negative Renal ROS     Musculoskeletal   Abdominal Normal abdominal exam  (+)   Peds  Hematology negative hematology ROS (+)   Anesthesia Other Findings   Reproductive/Obstetrics (+) Pregnancy                             Anesthesia Physical Anesthesia Plan  ASA: II  Anesthesia Plan: Epidural   Post-op Pain Management:    Induction:   Airway Management Planned:   Additional Equipment:   Intra-op Plan:   Post-operative Plan:   Informed Consent: I have reviewed the patients History and Physical, chart, labs and discussed the procedure including the risks, benefits and alternatives for the proposed anesthesia with the patient or authorized representative who has indicated his/her understanding and acceptance.     Plan Discussed with:   Anesthesia Plan Comments:         Anesthesia Quick Evaluation

## 2014-11-30 NOTE — Progress Notes (Signed)
SVD of vigerous female infant w/ apgars of 9,9.  Placenta delivered spontaneous w/ 3VC.   No lacerations Fundus firm.  EBL 200cc.

## 2014-12-01 LAB — CBC
HEMATOCRIT: 26.1 % — AB (ref 36.0–46.0)
HEMOGLOBIN: 8.4 g/dL — AB (ref 12.0–15.0)
MCH: 26.4 pg (ref 26.0–34.0)
MCHC: 32.2 g/dL (ref 30.0–36.0)
MCV: 82.1 fL (ref 78.0–100.0)
Platelets: 329 10*3/uL (ref 150–400)
RBC: 3.18 MIL/uL — ABNORMAL LOW (ref 3.87–5.11)
RDW: 13.8 % (ref 11.5–15.5)
WBC: 11.2 10*3/uL — ABNORMAL HIGH (ref 4.0–10.5)

## 2014-12-01 MED ORDER — OXYCODONE-ACETAMINOPHEN 5-325 MG PO TABS: 1.0000 | ORAL_TABLET | ORAL | Status: DC | PRN

## 2014-12-01 NOTE — Discharge Summary (Signed)
Obstetric Discharge Summary Reason for Admission: induction of labor Prenatal Procedures: ultrasound Intrapartum Procedures: spontaneous vaginal delivery Postpartum Procedures: none Complications-Operative and Postpartum: none HEMOGLOBIN  Date Value Ref Range Status  12/01/2014 8.4* 12.0 - 15.0 g/dL Final   HCT  Date Value Ref Range Status  12/01/2014 26.1* 36.0 - 46.0 % Final    Physical Exam:  General: alert and cooperative Lochia: appropriate Uterine Fundus: firm Incision: n/a DVT Evaluation: No evidence of DVT seen on physical exam.  Discharge Diagnoses: Term Pregnancy-delivered  Discharge Information: Date: 12/01/2014 Activity: pelvic rest Diet: routine Medications: PNV, Ibuprofen and Percocet Condition: stable Instructions: refer to practice specific booklet Discharge to: home Follow-up Information    Schedule an appointment as soon as possible for a visit in 6 weeks to follow up.      Newborn Data: Live born female  Birth Weight: 6 lb 15.5 oz (3160 g) APGAR: 9, 9  Home with mother.  Kaitlyn Sawyer 12/01/2014, 8:05 AM

## 2014-12-01 NOTE — Progress Notes (Signed)
Acknowledged order for social work consult for history of depression and anxiety.   MOB presented as surprised when CSW informed her of reason for consult.  She noted that she was diagnosed with depression about 5 years ago after the birth of her second child.  Informed that she had some medical complications which she believes led to the depression.  She did not receive formal treatment, and she denies any recurring symptoms.  She also denies any currently symptoms or depressive symptoms during pregnancy.   She denied any barriers to accessing mental health treatment if needs arise.   CSW did not complete full assessment since MOB stated that it was not needed.  Contact CSW if needs arise or upon MOB request.

## 2014-12-06 ENCOUNTER — Inpatient Hospital Stay (HOSPITAL_COMMUNITY)
Admission: AD | Admit: 2014-12-06 | Payer: Medicaid Other | Source: Ambulatory Visit | Admitting: Obstetrics and Gynecology

## 2014-12-06 ENCOUNTER — Inpatient Hospital Stay (HOSPITAL_COMMUNITY)
Admission: AD | Admit: 2014-12-06 | Discharge: 2014-12-07 | Disposition: A | Discharge status: Home / Self Care | Payer: Medicaid Other | Source: Ambulatory Visit | Attending: Obstetrics and Gynecology | Admitting: Obstetrics and Gynecology

## 2014-12-06 DIAGNOSIS — O9089 Other complications of the puerperium, not elsewhere classified: Secondary | ICD-10-CM

## 2014-12-06 DIAGNOSIS — IMO0001 Reserved for inherently not codable concepts without codable children: Secondary | ICD-10-CM

## 2014-12-06 DIAGNOSIS — R030 Elevated blood-pressure reading, without diagnosis of hypertension: Secondary | ICD-10-CM | POA: Insufficient documentation

## 2014-12-06 DIAGNOSIS — Z87891 Personal history of nicotine dependence: Secondary | ICD-10-CM | POA: Insufficient documentation

## 2014-12-06 DIAGNOSIS — R51 Headache: Secondary | ICD-10-CM | POA: Insufficient documentation

## 2014-12-06 DIAGNOSIS — Z880 Allergy status to penicillin: Secondary | ICD-10-CM | POA: Insufficient documentation

## 2014-12-07 ENCOUNTER — Encounter (HOSPITAL_COMMUNITY): Payer: Self-pay

## 2014-12-07 DIAGNOSIS — R030 Elevated blood-pressure reading, without diagnosis of hypertension: Secondary | ICD-10-CM | POA: Diagnosis not present

## 2014-12-07 DIAGNOSIS — R51 Headache: Secondary | ICD-10-CM | POA: Diagnosis present

## 2014-12-07 DIAGNOSIS — Z880 Allergy status to penicillin: Secondary | ICD-10-CM | POA: Diagnosis not present

## 2014-12-07 DIAGNOSIS — Z87891 Personal history of nicotine dependence: Secondary | ICD-10-CM | POA: Diagnosis not present

## 2014-12-07 DIAGNOSIS — O9089 Other complications of the puerperium, not elsewhere classified: Secondary | ICD-10-CM | POA: Diagnosis not present

## 2014-12-07 LAB — URIC ACID: Uric Acid, Serum: 6.3 mg/dL (ref 2.3–6.6)

## 2014-12-07 LAB — COMPREHENSIVE METABOLIC PANEL
ALT: 16 U/L (ref 14–54)
ANION GAP: 10 (ref 5–15)
AST: 16 U/L (ref 15–41)
Albumin: 3.3 g/dL — ABNORMAL LOW (ref 3.5–5.0)
Alkaline Phosphatase: 119 U/L (ref 38–126)
BUN: 8 mg/dL (ref 6–20)
CALCIUM: 8.7 mg/dL — AB (ref 8.9–10.3)
CHLORIDE: 105 mmol/L (ref 101–111)
CO2: 26 mmol/L (ref 22–32)
Creatinine, Ser: 0.62 mg/dL (ref 0.44–1.00)
GFR calc non Af Amer: >60 mL/min (ref >60)
Glucose, Bld: 101 mg/dL — ABNORMAL HIGH (ref 65–99)
Potassium: 3.6 mmol/L (ref 3.5–5.1)
SODIUM: 141 mmol/L (ref 135–145)
Total Bilirubin: 0.4 mg/dL (ref 0.3–1.2)
Total Protein: 6.6 g/dL (ref 6.5–8.1)

## 2014-12-07 LAB — CBC WITH DIFFERENTIAL/PLATELET
Basophils Absolute: 0 10*3/uL (ref 0.0–0.1)
Basophils Relative: 0 % (ref 0–1)
EOS ABS: 0.1 10*3/uL (ref 0.0–0.7)
EOS PCT: 1 % (ref 0–5)
HCT: 28.3 % — ABNORMAL LOW (ref 36.0–46.0)
Hemoglobin: 9.1 g/dL — ABNORMAL LOW (ref 12.0–15.0)
LYMPHS ABS: 2 10*3/uL (ref 0.7–4.0)
Lymphocytes Relative: 14 % (ref 12–46)
MCH: 26.5 pg (ref 26.0–34.0)
MCHC: 32.2 g/dL (ref 30.0–36.0)
MCV: 82.3 fL (ref 78.0–100.0)
MONOS PCT: 6 % (ref 3–12)
Monocytes Absolute: 0.8 10*3/uL (ref 0.1–1.0)
Neutro Abs: 11.2 10*3/uL — ABNORMAL HIGH (ref 1.7–7.7)
Neutrophils Relative %: 79 % — ABNORMAL HIGH (ref 43–77)
PLATELETS: 393 10*3/uL (ref 150–400)
RBC: 3.44 MIL/uL — ABNORMAL LOW (ref 3.87–5.11)
RDW: 14.1 % (ref 11.5–15.5)
WBC: 14.2 10*3/uL — ABNORMAL HIGH (ref 4.0–10.5)

## 2014-12-07 LAB — URINALYSIS, ROUTINE W REFLEX MICROSCOPIC
BILIRUBIN URINE: NEGATIVE
Glucose, UA: NEGATIVE mg/dL
Ketones, ur: NEGATIVE mg/dL
Nitrite: NEGATIVE
PH: 7 (ref 5.0–8.0)
Protein, ur: NEGATIVE mg/dL
SPECIFIC GRAVITY, URINE: 1.02 (ref 1.005–1.030)
UROBILINOGEN UA: 0.2 mg/dL (ref 0.0–1.0)

## 2014-12-07 LAB — PROTEIN / CREATININE RATIO, URINE
Creatinine, Urine: 172 mg/dL
PROTEIN CREATININE RATIO: 0.09 mg/mg{creat} (ref 0.00–0.15)
TOTAL PROTEIN, URINE: 16 mg/dL

## 2014-12-07 LAB — URINE MICROSCOPIC-ADD ON

## 2014-12-07 LAB — LACTATE DEHYDROGENASE: LDH: 203 U/L — AB (ref 98–192)

## 2014-12-07 NOTE — Discharge Instructions (Signed)
Postpartum Depression and Baby Blues °The postpartum period begins right after the birth of a baby. During this time, there is often a great amount of joy and excitement. It is also a time of many changes in the life of the parents. Regardless of how many times a mother gives birth, each child brings new challenges and dynamics to the family. It is not unusual to have feelings of excitement along with confusing shifts in moods, emotions, and thoughts. All mothers are at risk of developing postpartum depression or the "baby blues." These mood changes can occur right after giving birth, or they may occur many months after giving birth. The baby blues or postpartum depression can be mild or severe. Additionally, postpartum depression can go away rather quickly, or it can be a long-term condition.  °CAUSES °Raised hormone levels and the rapid drop in those levels are thought to be a main cause of postpartum depression and the baby blues. A number of hormones change during and after pregnancy. Estrogen and progesterone usually decrease right after the delivery of your baby. The levels of thyroid hormone and various cortisol steroids also rapidly drop. Other factors that play a role in these mood changes include major life events and genetics.  °RISK FACTORS °If you have any of the following risks for the baby blues or postpartum depression, know what symptoms to watch out for during the postpartum period. Risk factors that may increase the likelihood of getting the baby blues or postpartum depression include: °· Having a personal or family history of depression.   °· Having depression while being pregnant.   °· Having premenstrual mood issues or mood issues related to oral contraceptives. °· Having a lot of life stress.   °· Having marital conflict.   °· Lacking a social support network.   °· Having a baby with special needs.   °· Having health problems, such as diabetes.   °SIGNS AND SYMPTOMS °Symptoms of baby blues  include: °· Brief changes in mood, such as going from extreme happiness to sadness. °· Decreased concentration.   °· Difficulty sleeping.   °· Crying spells, tearfulness.   °· Irritability.   °· Anxiety.   °Symptoms of postpartum depression typically begin within the first month after giving birth. These symptoms include: °· Difficulty sleeping or excessive sleepiness.   °· Marked weight loss.   °· Agitation.   °· Feelings of worthlessness.   °· Lack of interest in activity or food.   °Postpartum psychosis is a very serious condition and can be dangerous. Fortunately, it is rare. Displaying any of the following symptoms is cause for immediate medical attention. Symptoms of postpartum psychosis include:  °· Hallucinations and delusions.   °· Bizarre or disorganized behavior.   °· Confusion or disorientation.   °DIAGNOSIS  °A diagnosis is made by an evaluation of your symptoms. There are no medical or lab tests that lead to a diagnosis, but there are various questionnaires that a health care provider may use to identify those with the baby blues, postpartum depression, or psychosis. Often, a screening tool called the Edinburgh Postnatal Depression Scale is used to diagnose depression in the postpartum period.  °TREATMENT °The baby blues usually goes away on its own in 1-2 weeks. Social support is often all that is needed. You will be encouraged to get adequate sleep and rest. Occasionally, you may be given medicines to help you sleep.  °Postpartum depression requires treatment because it can last several months or longer if it is not treated. Treatment may include individual or group therapy, medicine, or both to address any social, physiological, and psychological   factors that may play a role in the depression. Regular exercise, a healthy diet, rest, and social support may also be strongly recommended.  °Postpartum psychosis is more serious and needs treatment right away. Hospitalization is often needed. °HOME CARE  INSTRUCTIONS °· Get as much rest as you can. Nap when the baby sleeps.   °· Exercise regularly. Some women find yoga and walking to be beneficial.   °· Eat a balanced and nourishing diet.   °· Do little things that you enjoy. Have a cup of tea, take a bubble bath, read your favorite magazine, or listen to your favorite music. °· Avoid alcohol.   °· Ask for help with household chores, cooking, grocery shopping, or running errands as needed. Do not try to do everything.   °· Talk to people close to you about how you are feeling. Get support from your partner, family members, friends, or other new moms. °· Try to stay positive in how you think. Think about the things you are grateful for.   °· Do not spend a lot of time alone.   °· Only take over-the-counter or prescription medicine as directed by your health care provider. °· Keep all your postpartum appointments.   °· Let your health care provider know if you have any concerns.   °SEEK MEDICAL CARE IF: °You are having a reaction to or problems with your medicine. °SEEK IMMEDIATE MEDICAL CARE IF: °· You have suicidal feelings.   °· You think you may harm the baby or someone else. °MAKE SURE YOU: °· Understand these instructions. °· Will watch your condition. °· Will get help right away if you are not doing well or get worse. °Document Released: 01/08/2004 Document Revised: 04/10/2013 Document Reviewed: 01/15/2013 °Exit[employer]® Patient Information ©2015 [employer]. This information is not intended to replace advice given to you by your health care provider. Make sure you discuss any questions you have with your health care provider. °Postpartum Care After Vaginal Delivery °After you deliver your newborn (postpartum period), the usual stay in the hospital is 24-72 hours. If there were problems with your labor or delivery, or if you have other medical problems, you might be in the hospital longer.  °While you are in the hospital, you will receive help and instructions on  how to care for yourself and your newborn during the postpartum period.  °While you are in the hospital: °· Be sure to tell your nurses if you have pain or discomfort, as well as where you feel the pain and what makes the pain worse. °· If you had an incision made near your vagina (episiotomy) or if you had some tearing during delivery, the nurses may put ice packs on your episiotomy or tear. The ice packs may help to reduce the pain and swelling. °· If you are breastfeeding, you may feel uncomfortable contractions of your uterus for a couple of weeks. This is normal. The contractions help your uterus get back to normal size. °· It is normal to have some bleeding after delivery. °¨ For the first 1-3 days after delivery, the flow is red and the amount may be similar to a period. °¨ It is common for the flow to start and stop. °¨ In the first few days, you may pass some small clots. Let your nurses know if you begin to pass large clots or your flow increases. °¨ Do not  flush blood clots down the toilet before having the nurse look at them. °¨ During the next 3-10 days after delivery, your flow should become more watery   and pink or brown-tinged in color. °¨ Ten to fourteen days after delivery, your flow should be a small amount of yellowish-white discharge. °¨ The amount of your flow will decrease over the first few weeks after delivery. Your flow may stop in 6-8 weeks. Most women have had their flow stop by 12 weeks after delivery. °· You should change your sanitary pads frequently. °· Wash your hands thoroughly with soap and water for at least 20 seconds after changing pads, using the toilet, or before holding or feeding your newborn. °· You should feel like you need to empty your bladder within the first 6-8 hours after delivery. °· In case you become weak, lightheaded, or faint, call your nurse before you get out of bed for the first time and before you take a shower for the first time. °· Within the first few  days after delivery, your breasts may begin to feel tender and full. This is called engorgement. Breast tenderness usually goes away within 48-72 hours after engorgement occurs. You may also notice milk leaking from your breasts. If you are not breastfeeding, do not stimulate your breasts. Breast stimulation can make your breasts produce more milk. °· Spending as much time as possible with your newborn is very important. During this time, you and your newborn can feel close and get to know each other. Having your newborn stay in your room (rooming in) will help to strengthen the bond with your newborn.  It will give you time to get to know your newborn and become comfortable caring for your newborn. °· Your hormones change after delivery. Sometimes the hormone changes can temporarily cause you to feel sad or tearful. These feelings should not last more than a few days. If these feelings last longer than that, you should talk to your caregiver. °· If desired, talk to your caregiver about methods of family planning or contraception. °· Talk to your caregiver about immunizations. Your caregiver may want you to have the following immunizations before leaving the hospital: °¨ Tetanus, diphtheria, and pertussis (Tdap) or tetanus and diphtheria (Td) immunization. It is very important that you and your family (including grandparents) or others caring for your newborn are up-to-date with the Tdap or Td immunizations. The Tdap or Td immunization can help protect your newborn from getting ill. °¨ Rubella immunization. °¨ Varicella (chickenpox) immunization. °¨ Influenza immunization. You should receive this annual immunization if you did not receive the immunization during your pregnancy. °Document Released: 01/31/2007 Document Revised: 12/29/2011 Document Reviewed: 12/01/2011 °ExitCare® Patient Information ©2015 [employer]. This information is not intended to replace advice given to you by your health care provider. Make  sure you discuss any questions you have with your health care provider. ° °

## 2014-12-07 NOTE — MAU Provider Note (Signed)
History     CSN: [id number]  Arrival date and time: 12/06/14 2332   First Provider Initiated Contact with Patient 12/07/14 0122      Chief Complaint  Patient presents with  . Hypertension  . Headache   HPI JOSEPH JOHNS 29 y.o. [id number] that delivered vaginally one week ago presents to MA with elevated blood pressure.  Today, she had a HA and that prompted her to check her BP.  It was 164/95 at the highest.  She was nervous because in a previous pregnancy - she had eclampsia following delivery.  The HA is on top of her head, feels like a nagging, mild HA.  It is presently 6/10.  Has not taken medication for it.  It got worse on her way here.  She drove herself here with the baby.  She notes feeling overwhelmed with having a 84 year old and a new baby.  She denies vision changes, epigastric pain, swelling, fever, weakness, SOB, CP.   OB History    Gravida Para Term Preterm AB TAB SAB Ectopic Multiple Living   4 4 4  0 0 0 0 0 0 4      Past Medical History  Diagnosis Date  . Vaginal Pap smear, abnormal     cryo- HPV  . Pregnancy induced hypertension     PP eclamptia #2  . Anxiety     hx of meds, none currently  . Depression     hx pp  . Eclampsia, postpartum     Past Surgical History  Procedure Laterality Date  . Dilation and curettage of uterus      post partem retained placenta  . Cryotherapy      Family History  Problem Relation Age of Onset  . Alcohol abuse Maternal Grandfather     liver failure  . Heart disease Paternal Grandfather     "massive MI"  . Stroke Paternal Grandmother     Social History  Substance Use Topics  . Smoking status: Former Games developer  . Smokeless tobacco: Never Used     Comment: quit with + preg  . Alcohol Use: No    Allergies:  Allergies  Allergen Reactions  . Diclegis [Doxylamine-Pyridoxine] Hives, Itching and Rash  . Penicillins Swelling and Rash    Prescriptions prior to admission  Medication Sig Dispense Refill Last Dose   . ibuprofen (ADVIL,MOTRIN) 600 MG tablet Take 1 tablet (600 mg total) by mouth every 6 (six) hours. 30 tablet 1 Past Week at Unknown time  . oxyCODONE-acetaminophen (PERCOCET/ROXICET) 5-325 MG per tablet Take 1-2 tablets by mouth every 4 (four) hours as needed (for pain scale greater than 7). 30 tablet 0 Past Week at Unknown time  . Prenatal Vit-Fe Fumarate-FA (PRENATAL MULTIVITAMIN) TABS tablet Take 1 tablet by mouth daily at 12 noon.   Past Week at Unknown time    ROS Pertinent ROS in HPI.  All other systems are negative.   Physical Exam   Blood pressure 152/90, pulse 61, temperature 98.5 F (36.9 C), resp. rate 16, last menstrual period 03/01/2014, unknown if currently breastfeeding.  Physical Exam  Constitutional: She is oriented to person, place, and time. She appears well-developed and well-nourished. No distress.  HENT:  Head: Normocephalic and atraumatic.  Eyes: EOM are normal.  Neck: Normal range of motion.  Cardiovascular: Normal rate.   Murmur heard. Respiratory: Breath sounds normal. No respiratory distress.  GI: Soft. She exhibits no distension.  Musculoskeletal: Normal range of motion.  Neurological: She is  alert and oriented to person, place, and time.  Skin: Skin is warm and dry.  Psychiatric: She has a normal mood and affect.   Results for orders placed or performed during the hospital encounter of 12/06/14 (from the past 24 hour(s))  Urinalysis, Routine w reflex microscopic (not at [facility])     Status: Abnormal   Collection Time: 12/07/14 12:10 AM  Result Value Ref Range   Color, Urine YELLOW YELLOW   APPearance CLEAR CLEAR   Specific Gravity, Urine 1.020 1.005 - 1.030   pH 7.0 5.0 - 8.0   Glucose, UA NEGATIVE NEGATIVE mg/dL   Hgb urine dipstick LARGE (A) NEGATIVE   Bilirubin Urine NEGATIVE NEGATIVE   Ketones, ur NEGATIVE NEGATIVE mg/dL   Protein, ur NEGATIVE NEGATIVE mg/dL   Urobilinogen, UA 0.2 0.0 - 1.0 mg/dL   Nitrite NEGATIVE NEGATIVE   Leukocytes, UA  TRACE (A) NEGATIVE  Protein / creatinine ratio, urine     Status: None   Collection Time: 12/07/14 12:10 AM  Result Value Ref Range   Creatinine, Urine 172.00 mg/dL   Total Protein, Urine 16 mg/dL   Protein Creatinine Ratio 0.09 0.00 - 0.15 mg/mg[Cre]  Urine microscopic-add on     Status: Abnormal   Collection Time: 12/07/14 12:10 AM  Result Value Ref Range   Squamous Epithelial / LPF RARE RARE   WBC, UA 0-2 <3 WBC/hpf   RBC / HPF 11-20 <3 RBC/hpf   Bacteria, UA FEW (A) RARE   Urine-Other MUCOUS PRESENT   CBC with Differential/Platelet     Status: Abnormal   Collection Time: 12/07/14 12:25 AM  Result Value Ref Range   WBC 14.2 (H) 4.0 - 10.5 K/uL   RBC 3.44 (L) 3.87 - 5.11 MIL/uL   Hemoglobin 9.1 (L) 12.0 - 15.0 g/dL   HCT 45.4 (L) 09.8 - 11.9 %   MCV 82.3 78.0 - 100.0 fL   MCH 26.5 26.0 - 34.0 pg   MCHC 32.2 30.0 - 36.0 g/dL   RDW 14.7 82.9 - 56.2 %   Platelets 393 150 - 400 K/uL   Neutrophils Relative % 79 (H) 43 - 77 %   Neutro Abs 11.2 (H) 1.7 - 7.7 K/uL   Lymphocytes Relative 14 12 - 46 %   Lymphs Abs 2.0 0.7 - 4.0 K/uL   Monocytes Relative 6 3 - 12 %   Monocytes Absolute 0.8 0.1 - 1.0 K/uL   Eosinophils Relative 1 0 - 5 %   Eosinophils Absolute 0.1 0.0 - 0.7 K/uL   Basophils Relative 0 0 - 1 %   Basophils Absolute 0.0 0.0 - 0.1 K/uL  Comprehensive metabolic panel     Status: Abnormal   Collection Time: 12/07/14 12:25 AM  Result Value Ref Range   Sodium 141 135 - 145 mmol/L   Potassium 3.6 3.5 - 5.1 mmol/L   Chloride 105 101 - 111 mmol/L   CO2 26 22 - 32 mmol/L   Glucose, Bld 101 (H) 65 - 99 mg/dL   BUN 8 6 - 20 mg/dL   Creatinine, Ser 1.30 0.44 - 1.00 mg/dL   Calcium 8.7 (L) 8.9 - 10.3 mg/dL   Total Protein 6.6 6.5 - 8.1 g/dL   Albumin 3.3 (L) 3.5 - 5.0 g/dL   AST 16 15 - 41 U/L   ALT 16 14 - 54 U/L   Alkaline Phosphatase 119 38 - 126 U/L   Total Bilirubin 0.4 0.3 - 1.2 mg/dL   GFR calc non Af Amer >60 >  60 mL/min   GFR calc Af Amer >60 >60 mL/min   Anion  gap 10 5 - 15  Uric acid     Status: None   Collection Time: 12/07/14 12:25 AM  Result Value Ref Range   Uric Acid, Serum 6.3 2.3 - 6.6 mg/dL  Lactate dehydrogenase     Status: Abnormal   Collection Time: 12/07/14 12:25 AM  Result Value Ref Range   LDH 203 (H) 98 - 192 U/L    MAU Course  Procedures  MDM Discussed with Dr. Marcelle Overlie.  Labs results reviewed as well as blood pressures.   He advises pt to be discharged home with reassurance and f/u in office as scheduled/as needed.    Assessment and Plan  A: Elevated BP in postpartum period  P: Discharge to home Push PO fluids OTC Tylenol or Ibuprofen for HA. Encourage good rest F/u in office as needed/as scheduled Patient may return to MAU as needed or if her condition were to change or worsen   Bertram Denver 12/07/2014, 1:23 AM

## 2014-12-07 NOTE — MAU Note (Signed)
SVD 8/13. Today had headache and took B/P and was elevated. After 2nd baby B/P went up after delivery and had seizure and sort of feels the same as did then.

## 2015-01-09 NOTE — H&P (Signed)
  29 year G 4 P 4 presents for permanent sterilization.  Past Medical History  Diagnosis Date  . Vaginal Pap smear, abnormal     cryo- HPV  . Pregnancy induced hypertension     PP eclamptia #2  . Anxiety     hx of meds, none currently  . Depression     hx pp  . Eclampsia, postpartum    Past Surgical History  Procedure Laterality Date  . Dilation and curettage of uterus      post partem retained placenta  . Cryotherapy     Allergic to diclegis and penicillin  Family History  Problem Relation Age of Onset  . Alcohol abuse Maternal Grandfather     liver failure  . Heart disease Paternal Grandfather     "massive MI"  . Stroke Paternal Grandmother    Social History   Social History  . Marital Status: Single    Spouse Name: N/A  . Number of Children: N/A  . Years of Education: N/A   Social History Main Topics  . Smoking status: Former Games developer  . Smokeless tobacco: Never Used     Comment: quit with + preg  . Alcohol Use: No  . Drug Use: No  . Sexual Activity: Yes   Other Topics Concern  . Not on file   Social History Narrative   There were no vitals taken for this visit. General alert and oriented Lung CTAB Car RRR Abdomen is soft and non tender  IMPRESSION: Desires permanent sterilization  PLAN: [facility] BTL Risks reviewed Consent signed

## 2015-01-14 ENCOUNTER — Encounter (HOSPITAL_BASED_OUTPATIENT_CLINIC_OR_DEPARTMENT_OTHER): Payer: Self-pay | Admitting: *Deleted

## 2015-01-14 NOTE — Progress Notes (Signed)
NPO AFTER MN.  ARRIVE AT 0600.  TO GET LAB WORK DONE Friday 01-17-2015.   

## 2015-01-17 DIAGNOSIS — Z888 Allergy status to other drugs, medicaments and biological substances status: Secondary | ICD-10-CM | POA: Diagnosis not present

## 2015-01-17 DIAGNOSIS — Z87891 Personal history of nicotine dependence: Secondary | ICD-10-CM | POA: Diagnosis not present

## 2015-01-17 DIAGNOSIS — Z880 Allergy status to penicillin: Secondary | ICD-10-CM | POA: Diagnosis not present

## 2015-01-17 DIAGNOSIS — F419 Anxiety disorder, unspecified: Secondary | ICD-10-CM | POA: Diagnosis not present

## 2015-01-17 DIAGNOSIS — Z302 Encounter for sterilization: Secondary | ICD-10-CM | POA: Diagnosis present

## 2015-01-17 DIAGNOSIS — F329 Major depressive disorder, single episode, unspecified: Secondary | ICD-10-CM | POA: Diagnosis not present

## 2015-01-17 LAB — CBC
HCT: 39.6 % (ref 36.0–46.0)
Hemoglobin: 12.7 g/dL (ref 12.0–15.0)
MCH: 26.2 pg (ref 26.0–34.0)
MCHC: 32.1 g/dL (ref 30.0–36.0)
MCV: 81.6 fL (ref 78.0–100.0)
PLATELETS: 329 10*3/uL (ref 150–400)
RBC: 4.85 MIL/uL (ref 3.87–5.11)
RDW: 15.1 % (ref 11.5–15.5)
WBC: 8.6 10*3/uL (ref 4.0–10.5)

## 2015-01-22 NOTE — Anesthesia Preprocedure Evaluation (Signed)
Anesthesia Evaluation  Patient identified by MRN, date of birth, ID band Patient awake    Reviewed: Allergy & Precautions, H&P , NPO status , Patient's Chart, lab work & pertinent test results  Airway Mallampati: II  TM Distance: >3 FB Neck ROM: full    Dental no notable dental hx. (+) Dental Advisory Given, Teeth Intact   Pulmonary neg pulmonary ROS, Current Smoker,    Pulmonary exam normal breath sounds clear to auscultation       Cardiovascular Exercise Tolerance: Good negative cardio ROS Normal cardiovascular exam Rhythm:regular Rate:Normal     Neuro/Psych negative neurological ROS  negative psych ROS   GI/Hepatic negative GI ROS, Neg liver ROS,   Endo/Other  negative endocrine ROS  Renal/GU negative Renal ROS  negative genitourinary   Musculoskeletal   Abdominal   Peds  Hematology negative hematology ROS (+)   Anesthesia Other Findings   Reproductive/Obstetrics negative OB ROS History  ecclampsia                             Anesthesia Physical Anesthesia Plan  ASA: II  Anesthesia Plan: General   Post-op Pain Management:    Induction: Intravenous  Airway Management Planned: Oral ETT  Additional Equipment:   Intra-op Plan:   Post-operative Plan: Extubation in OR  Informed Consent: I have reviewed the patients History and Physical, chart, labs and discussed the procedure including the risks, benefits and alternatives for the proposed anesthesia with the patient or authorized representative who has indicated his/her understanding and acceptance.   Dental Advisory Given  Plan Discussed with: CRNA and Surgeon  Anesthesia Plan Comments:         Anesthesia Quick Evaluation

## 2015-01-23 ENCOUNTER — Ambulatory Visit (HOSPITAL_BASED_OUTPATIENT_CLINIC_OR_DEPARTMENT_OTHER)
Admission: RE | Admit: 2015-01-23 | Discharge: 2015-01-23 | Disposition: A | Discharge status: Home / Self Care | Payer: Medicaid Other | Source: Ambulatory Visit | Attending: Obstetrics and Gynecology | Admitting: Obstetrics and Gynecology

## 2015-01-23 ENCOUNTER — Encounter (HOSPITAL_BASED_OUTPATIENT_CLINIC_OR_DEPARTMENT_OTHER)
Admission: RE | Disposition: A | Discharge status: Home / Self Care | Payer: Self-pay | Source: Ambulatory Visit | Attending: Obstetrics and Gynecology

## 2015-01-23 ENCOUNTER — Encounter (HOSPITAL_BASED_OUTPATIENT_CLINIC_OR_DEPARTMENT_OTHER): Payer: Self-pay | Admitting: *Deleted

## 2015-01-23 ENCOUNTER — Ambulatory Visit (HOSPITAL_BASED_OUTPATIENT_CLINIC_OR_DEPARTMENT_OTHER): Payer: Medicaid Other | Admitting: Anesthesiology

## 2015-01-23 DIAGNOSIS — F419 Anxiety disorder, unspecified: Secondary | ICD-10-CM | POA: Diagnosis not present

## 2015-01-23 DIAGNOSIS — Z880 Allergy status to penicillin: Secondary | ICD-10-CM | POA: Insufficient documentation

## 2015-01-23 DIAGNOSIS — Z302 Encounter for sterilization: Secondary | ICD-10-CM | POA: Diagnosis not present

## 2015-01-23 DIAGNOSIS — F329 Major depressive disorder, single episode, unspecified: Secondary | ICD-10-CM | POA: Insufficient documentation

## 2015-01-23 DIAGNOSIS — Z888 Allergy status to other drugs, medicaments and biological substances status: Secondary | ICD-10-CM | POA: Insufficient documentation

## 2015-01-23 DIAGNOSIS — Z87891 Personal history of nicotine dependence: Secondary | ICD-10-CM | POA: Diagnosis not present

## 2015-01-23 HISTORY — DX: Personal history of other diseases of the female genital tract: Z87.42

## 2015-01-23 HISTORY — DX: Cardiac murmur, unspecified: R01.1

## 2015-01-23 HISTORY — DX: Personal history of other mental and behavioral disorders: Z86.59

## 2015-01-23 HISTORY — DX: Personal history of other specified conditions: Z87.898

## 2015-01-23 HISTORY — DX: Personal history of other complications of pregnancy, childbirth and the puerperium: Z87.59

## 2015-01-23 HISTORY — PX: LAPAROSCOPIC TUBAL LIGATION: SHX1937

## 2015-01-23 LAB — POCT I-STAT, CHEM 8
BUN: 7 mg/dL (ref 6–20)
CALCIUM ION: 1.24 mmol/L — AB (ref 1.12–1.23)
CREATININE: 0.8 mg/dL (ref 0.44–1.00)
Chloride: 105 mmol/L (ref 101–111)
GLUCOSE: 95 mg/dL (ref 65–99)
HCT: 35 % — ABNORMAL LOW (ref 36.0–46.0)
HEMOGLOBIN: 11.9 g/dL — AB (ref 12.0–15.0)
POTASSIUM: 3.6 mmol/L (ref 3.5–5.1)
Sodium: 144 mmol/L (ref 135–145)
TCO2: 25 mmol/L (ref 0–100)

## 2015-01-23 LAB — POCT PREGNANCY, URINE: Preg Test, Ur: NEGATIVE

## 2015-01-23 LAB — TYPE AND SCREEN
ABO/RH(D): AB POS
ANTIBODY SCREEN: NEGATIVE

## 2015-01-23 LAB — ABO/RH: ABO/RH(D): AB POS

## 2015-01-23 SURGERY — LIGATION, FALLOPIAN TUBE, LAPAROSCOPIC
Anesthesia: General | Site: Abdomen | Laterality: Bilateral

## 2015-01-23 MED ORDER — PROPOFOL 10 MG/ML IV BOLUS
INTRAVENOUS | Status: DC | PRN
  Administered 2015-01-23: 150 mg via INTRAVENOUS

## 2015-01-23 MED ORDER — LACTATED RINGERS IV SOLN
INTRAVENOUS | Status: DC
  Administered 2015-01-23 (×2): via INTRAVENOUS
  Filled 2015-01-23: qty 1000

## 2015-01-23 MED ORDER — GENTAMICIN SULFATE 40 MG/ML IJ SOLN
INTRAVENOUS | Status: AC
  Administered 2015-01-23: 08:00:00 via INTRAVENOUS
  Administered 2015-01-23: 100 mL via INTRAVENOUS
  Filled 2015-01-23: qty 7.5

## 2015-01-23 MED ORDER — OXYCODONE-ACETAMINOPHEN 10-325 MG PO TABS: 1.0000 | ORAL_TABLET | ORAL | Status: DC | PRN

## 2015-01-23 MED ORDER — FENTANYL CITRATE (PF) 100 MCG/2ML IJ SOLN
INTRAMUSCULAR | Status: AC
  Filled 2015-01-23: qty 4

## 2015-01-23 MED ORDER — NEOSTIGMINE METHYLSULFATE 10 MG/10ML IV SOLN
INTRAVENOUS | Status: DC | PRN
  Administered 2015-01-23: 4 mg via INTRAVENOUS

## 2015-01-23 MED ORDER — MIDAZOLAM HCL 2 MG/2ML IJ SOLN
INTRAMUSCULAR | Status: AC
Start: 2015-01-23 — End: 2015-01-23
  Filled 2015-01-23: qty 2

## 2015-01-23 MED ORDER — ROCURONIUM BROMIDE 100 MG/10ML IV SOLN
INTRAVENOUS | Status: DC | PRN
  Administered 2015-01-23: 30 mg via INTRAVENOUS

## 2015-01-23 MED ORDER — METOCLOPRAMIDE HCL 5 MG/ML IJ SOLN
INTRAMUSCULAR | Status: DC | PRN
  Administered 2015-01-23: 10 mg via INTRAVENOUS

## 2015-01-23 MED ORDER — FENTANYL CITRATE (PF) 100 MCG/2ML IJ SOLN
INTRAMUSCULAR | Status: DC | PRN
  Administered 2015-01-23 (×4): 50 ug via INTRAVENOUS

## 2015-01-23 MED ORDER — FENTANYL CITRATE (PF) 100 MCG/2ML IJ SOLN
INTRAMUSCULAR | Status: AC
  Filled 2015-01-23: qty 2

## 2015-01-23 MED ORDER — FENTANYL CITRATE (PF) 100 MCG/2ML IJ SOLN
25.0000 ug | INTRAMUSCULAR | Status: DC | PRN
  Administered 2015-01-23: 25 ug via INTRAVENOUS
  Filled 2015-01-23: qty 1

## 2015-01-23 MED ORDER — LACTATED RINGERS IV SOLN
INTRAVENOUS | Status: DC
  Filled 2015-01-23: qty 1000

## 2015-01-23 MED ORDER — MIDAZOLAM HCL 5 MG/5ML IJ SOLN
INTRAMUSCULAR | Status: DC | PRN
  Administered 2015-01-23: 2 mg via INTRAVENOUS

## 2015-01-23 MED ORDER — LACTATED RINGERS IV SOLN
INTRAVENOUS | Status: DC
  Administered 2015-01-23: 06:00:00 via INTRAVENOUS
  Filled 2015-01-23: qty 1000

## 2015-01-23 MED ORDER — LIDOCAINE HCL (CARDIAC) 20 MG/ML IV SOLN
INTRAVENOUS | Status: DC | PRN
  Administered 2015-01-23: 50 mg via INTRAVENOUS

## 2015-01-23 MED ORDER — OXYCODONE HCL 5 MG PO TABS
ORAL_TABLET | ORAL | Status: AC
  Filled 2015-01-23: qty 1

## 2015-01-23 MED ORDER — LIDOCAINE HCL 4 % MT SOLN
OROMUCOSAL | Status: DC | PRN
  Administered 2015-01-23: 3 mL via TOPICAL

## 2015-01-23 MED ORDER — ONDANSETRON HCL 4 MG/2ML IJ SOLN
INTRAMUSCULAR | Status: DC | PRN
  Administered 2015-01-23: 4 mg via INTRAVENOUS

## 2015-01-23 MED ORDER — GLYCOPYRROLATE 0.2 MG/ML IJ SOLN
INTRAMUSCULAR | Status: DC | PRN
  Administered 2015-01-23: 0.6 mg via INTRAVENOUS

## 2015-01-23 MED ORDER — KETOROLAC TROMETHAMINE 30 MG/ML IJ SOLN
INTRAMUSCULAR | Status: DC | PRN
  Administered 2015-01-23: 30 mg via INTRAVENOUS

## 2015-01-23 MED ORDER — BUPIVACAINE HCL (PF) 0.25 % IJ SOLN
INTRAMUSCULAR | Status: DC | PRN
  Administered 2015-01-23: 2 mL

## 2015-01-23 MED ORDER — OXYCODONE HCL 5 MG PO TABS
5.0000 mg | ORAL_TABLET | Freq: Once | ORAL | Status: AC
  Administered 2015-01-23: 5 mg via ORAL
  Filled 2015-01-23: qty 1

## 2015-01-23 MED ORDER — ACETAMINOPHEN 10 MG/ML IV SOLN
INTRAVENOUS | Status: DC | PRN
  Administered 2015-01-23: 1000 mg via INTRAVENOUS

## 2015-01-23 MED ORDER — DEXAMETHASONE SODIUM PHOSPHATE 4 MG/ML IJ SOLN
INTRAMUSCULAR | Status: DC | PRN
  Administered 2015-01-23: 10 mg via INTRAVENOUS

## 2015-01-23 SURGICAL SUPPLY — 55 items
APPLICATOR COTTON TIP 6IN STRL (MISCELLANEOUS) ×2 IMPLANT
BAG SPEC RTRVL LRG 6X4 10 (ENDOMECHANICALS)
BAG URINE DRAINAGE (UROLOGICAL SUPPLIES) IMPLANT
BANDAGE ADHESIVE 1X3 (GAUZE/BANDAGES/DRESSINGS) IMPLANT
BLADE CLIPPER SURG (BLADE) ×2 IMPLANT
BLADE SURG 11 STRL SS (BLADE) ×2 IMPLANT
CANISTER SUCTION 2500CC (MISCELLANEOUS) IMPLANT
CATH FOLEY 2WAY SLVR  5CC 16FR (CATHETERS)
CATH FOLEY 2WAY SLVR 5CC 16FR (CATHETERS) IMPLANT
CATH ROBINSON RED A/P 16FR (CATHETERS) ×2 IMPLANT
CLOTH BEACON ORANGE TIMEOUT ST (SAFETY) ×2 IMPLANT
DRAPE UNDERBUTTOCKS STRL (DRAPE) ×2 IMPLANT
DRSG TELFA 3X8 NADH (GAUZE/BANDAGES/DRESSINGS) IMPLANT
ELECT REM PT RETURN 9FT ADLT (ELECTROSURGICAL) ×2
ELECTRODE REM PT RTRN 9FT ADLT (ELECTROSURGICAL) ×1 IMPLANT
GLOVE BIO SURGEON STRL SZ 6.5 (GLOVE) ×4 IMPLANT
GLOVE BIOGEL PI IND STRL 7.0 (GLOVE) IMPLANT
GLOVE BIOGEL PI IND STRL 7.5 (GLOVE) IMPLANT
GLOVE BIOGEL PI INDICATOR 7.0 (GLOVE) ×1
GLOVE BIOGEL PI INDICATOR 7.5 (GLOVE) ×1
GOWN STRL REUS W/ TWL LRG LVL3 (GOWN DISPOSABLE) ×2 IMPLANT
GOWN STRL REUS W/TWL LRG LVL3 (GOWN DISPOSABLE) ×4
GOWN STRL REUS W/TWL XL LVL3 (GOWN DISPOSABLE) ×2 IMPLANT
LIQUID BAND (GAUZE/BANDAGES/DRESSINGS) ×1 IMPLANT
MANIFOLD NEPTUNE II (INSTRUMENTS) IMPLANT
NDL HYPO 25X1 1.5 SAFETY (NEEDLE) ×1 IMPLANT
NDL INSUFFLATION 14GA 120MM (NEEDLE) ×1 IMPLANT
NDL INSUFFLATION 14GA 150MM (NEEDLE) IMPLANT
NEEDLE HYPO 25X1 1.5 SAFETY (NEEDLE) ×2 IMPLANT
NEEDLE INSUFFLATION 14GA 120MM (NEEDLE) ×2 IMPLANT
NEEDLE INSUFFLATION 14GA 150MM (NEEDLE) IMPLANT
NS IRRIG 500ML POUR BTL (IV SOLUTION) ×2 IMPLANT
PACK BASIN DAY SURGERY FS (CUSTOM PROCEDURE TRAY) ×2 IMPLANT
PACK LAPAROSCOPY II (CUSTOM PROCEDURE TRAY) ×2 IMPLANT
PAD DRESSING TELFA 3X8 NADH (GAUZE/BANDAGES/DRESSINGS) IMPLANT
PAD OB MATERNITY 4.3X12.25 (PERSONAL CARE ITEMS) ×2 IMPLANT
PAD PREP 24X48 CUFFED NSTRL (MISCELLANEOUS) ×2 IMPLANT
PADDING ION DISPOSABLE (MISCELLANEOUS) ×2 IMPLANT
POUCH SPECIMEN RETRIEVAL 10MM (ENDOMECHANICALS) IMPLANT
SCISSORS LAP 5X35 DISP (ENDOMECHANICALS) IMPLANT
SET IRRIG TUBING LAPAROSCOPIC (IRRIGATION / IRRIGATOR) IMPLANT
SOLUTION ANTI FOG 6CC (MISCELLANEOUS) ×2 IMPLANT
STRIP CLOSURE SKIN 1/4X4 (GAUZE/BANDAGES/DRESSINGS) IMPLANT
SUT VIC AB 3-0 PS2 18 (SUTURE) ×2
SUT VIC AB 3-0 PS2 18XBRD (SUTURE) ×1 IMPLANT
SUT VICRYL 0 UR6 27IN ABS (SUTURE) ×2 IMPLANT
SYR CONTROL 10ML LL (SYRINGE) ×2 IMPLANT
SYRINGE 10CC LL (SYRINGE) ×6 IMPLANT
TOWEL OR 17X24 6PK STRL BLUE (TOWEL DISPOSABLE) ×4 IMPLANT
TRAY DSU PREP LF (CUSTOM PROCEDURE TRAY) ×2 IMPLANT
TROCAR OPTI TIP 5M 100M (ENDOMECHANICALS) IMPLANT
TROCAR XCEL DIL TIP R 11M (ENDOMECHANICALS) ×2 IMPLANT
TUBING INSUFFLATION 10FT LAP (TUBING) ×2 IMPLANT
VACUUM HOSE/TUBING 7/8INX6FT (MISCELLANEOUS) IMPLANT
WATER STERILE IRR 500ML POUR (IV SOLUTION) ×2 IMPLANT

## 2015-01-23 NOTE — Discharge Instructions (Signed)
Laparoscopic Tubal Ligation, Care After °Refer to this sheet in the next few weeks. These instructions provide you with information about caring for yourself after your procedure. Your health care provider may also give you more specific instructions. Your treatment has been planned according to current medical practices, but problems sometimes occur. Call your health care provider if you have any problems or questions after your procedure. °WHAT TO EXPECT AFTER THE PROCEDURE °After your procedure, it is common to have: °· Sore throat. °· Soreness at the incision site. °· Mild cramping. °· Tiredness. °· Mild nausea or vomiting. °· Shoulder pain. °HOME CARE INSTRUCTIONS °· Rest for the remainder of the day. °· Take medicines only as directed by your health care provider. These include over-the-counter medicines and prescription medicines. Do not take aspirin, which can cause bleeding. °· Over the next few days, gradually return to your normal activities and your normal diet. °· Avoid sexual intercourse for 2 weeks or as directed by your health care provider. °· Do not use tampons, and do not douche. °· Do not drive or operate heavy machinery while taking pain medicine. °· Do not lift anything that is heavier than 5 lb (2.3 kg) for 2 weeks or as directed by your health care provider. °· Do not take baths. Take showers only. Ask your health care provider when you can start taking baths. °· Take your temperature twice each day and write it down. °· Try to have help for your household needs for the first 7-10 days. °· There are many different ways to close and cover an incision, including stitches (sutures), skin glue, and adhesive strips. Follow instructions from your health care provider about: °¨ Incision care. °¨ Bandage (dressing) changes and removal. °¨ Incision closure removal. °· Check your incision area every day for signs of infection. Watch for: °¨ Redness, swelling, or pain. °¨ Fluid, blood, or pus. °· Keep  all follow-up visits as directed by your health care provider. °SEEK MEDICAL CARE IF: °· You have redness, swelling, or increasing pain in your incision area. °· You have fluid, blood, or pus coming from your incision for longer than 1 day. °· You notice a bad smell coming from your incision or your dressing. °· The edges of your incision break open after the sutures have been removed. °· Your pain does not decrease after 2-3 days. °· You have a rash. °· You repeatedly become dizzy or light-headed. °· You have a reaction to your medicine. °· Your pain medicine is not helping. °· You are constipated. °SEEK IMMEDIATE MEDICAL CARE IF: °· You have a fever. °· You faint. °· You have increasing pain in your abdomen. °· You have severe pain in one or both of your shoulders. °· You have bleeding or drainage from your suture sites or your vagina after surgery. °· You have shortness of breath or have difficulty breathing. °· You have chest pain or leg pain. °· You have ongoing nausea, vomiting, or diarrhea. °  °This information is not intended to replace advice given to you by your health care provider. Make sure you discuss any questions you have with your health care provider. °  °Document Released: 10/23/2004 Document Revised: 08/20/2014 Document Reviewed: 07/17/2011 °Elsevier Interactive Patient Education ©2016 [employer] ° ° °Post Anesthesia Home Care Instructions ° °Activity: °Get plenty of rest for the remainder of the day. A responsible adult should stay with you for 24 hours following the procedure.  °For the next 24 hours, DO NOT: °-Drive   a car °-Operate machinery °-Drink alcoholic beverages °-Take any medication unless instructed by your physician °-Make any legal decisions or sign important papers. ° °Meals: °Start with liquid foods such as gelatin or soup. Progress to regular foods as tolerated. Avoid greasy, spicy, heavy foods. If nausea and/or vomiting occur, drink only clear liquids until the nausea and/or  vomiting subsides. Call your physician if vomiting continues. ° °Special Instructions/Symptoms: °Your throat may feel dry or sore from the anesthesia or the breathing tube placed in your throat during surgery. If this causes discomfort, gargle with warm salt water. The discomfort should disappear within 24 hours. ° °If you had a scopolamine patch placed behind your ear for the management of post- operative nausea and/or vomiting: ° °1. The medication in the patch is effective for 72 hours, after which it should be removed.  Wrap patch in a tissue and discard in the trash. Wash hands thoroughly with soap and water. °2. You may remove the patch earlier than 72 hours if you experience unpleasant side effects which may include dry mouth, dizziness or visual disturbances. °3. Avoid touching the patch. Wash your hands with soap and water after contact with the patch. ° ° °

## 2015-01-23 NOTE — Brief Op Note (Signed)
01/23/2015  8:21 AM  PATIENT:  Kaitlyn Sawyer  29 y.o. female  PRE-OPERATIVE DIAGNOSIS:  desires sterilization  POST-OPERATIVE DIAGNOSIS:  desires sterilization  PROCEDURE:  Procedure(s): LAPAROSCOPIC TUBAL LIGATION (Bilateral)  SURGEON:  Surgeon(s) and Role:    * Marcelle Overlie, MD - Primary  PHYSICIAN ASSISTANT:   ASSISTANTS: none   ANESTHESIA:   general  EBL:     BLOOD ADMINISTERED:none  DRAINS: none   LOCAL MEDICATIONS USED:  MARCAINE     SPECIMEN:  No Specimen  DISPOSITION OF SPECIMEN:  N/A  COUNTS:  YES  TOURNIQUET:  * No tourniquets in log *  DICTATION: .Other Dictation: Dictation Number [id number]  PLAN OF CARE: Discharge to home after PACU  PATIENT DISPOSITION:  PACU - hemodynamically stable.   Delay start of Pharmacological VTE agent (>24hrs) due to surgical blood loss or risk of bleeding: not applicable

## 2015-01-23 NOTE — Op Note (Signed)
NAME:  Kaitlyn Sawyer, Kaitlyn Sawyer NO.:  [id number]  MEDICAL RECORD NO.:  [id number]  LOCATION:                                 FACILITY:  PHYSICIAN:  Simi Briel L. Jerrie Gullo, M.D.DATE OF BIRTH:  1985/10/05  DATE OF PROCEDURE:  01/23/2015 DATE OF DISCHARGE:                              OPERATIVE REPORT   PREOPERATIVE DIAGNOSIS:  Desires permanent sterilization.  POSTOPERATIVE DIAGNOSIS:  Desires permanent sterilization.  PROCEDURE:  Laparoscopic bilateral tubal ligation.  SURGEON:  Annye Forrey L. Vincente Poli, M.D.  ANESTHESIA:  General.  ESTIMATED BLOOD LOSS:  Minimal.  COMPLICATIONS:  None.  DRAINS:  None.  PATHOLOGY:  None.  PROCEDURE IN DETAIL:  The patient was taken to the operating room after she had been counseled.  She was prepped and draped in usual sterile fashion after she was intubated.  An in-and-out catheter was used to empty the bladder.  A uterine manipulator was then inserted.  Attention was turned to the abdomen where a small infraumbilical incision was made.  The Veress needle was inserted, and pneumoperitoneum was performed.  An 11 mm trocar was then inserted.  The patient was gently placed in Trendelenburg position.  I put the camera in and I could see there was no area of intestinal injury or bleeding noted.  The adnexa were normal.  Uterus was normal.  I used a long Kleppinger, identified the midportion of each fallopian tube and performed a tubal ligation with a triple burn technique.  After the tubal ligation was performed bilaterally, I removed the Kleppinger, reduced the pneumoperitoneum, noted that there was no area of bleeding noted, removed the trocar.  I closed the infraumbilical stitch; the fascia stitched with 0 Vicryl on a UR-6 needle and then closed the skin with Dermabond.  All sponge, lap, and instrument counts were correct x2.  The patient went to the recovery room in stable condition.    [name] L. Vincente Poli, M.D.    [name]  D:   01/23/2015  T:  01/23/2015  Job:  [id number]

## 2015-01-23 NOTE — Anesthesia Procedure Notes (Signed)
Procedure Name: Intubation Date/Time: 01/23/2015 7:42 AM Performed by: Mechele Claude Pre-anesthesia Checklist: Patient identified, Emergency Drugs available, Suction available and Patient being monitored Patient Re-evaluated:Patient Re-evaluated prior to inductionOxygen Delivery Method: Circle System Utilized Preoxygenation: Pre-oxygenation with 100% oxygen Intubation Type: IV induction Ventilation: Mask ventilation without difficulty Laryngoscope Size: Mac and 3 Grade View: Grade I Tube type: Oral Tube size: 7.0 mm Number of attempts: 1 Airway Equipment and Method: Stylet and LTA kit utilized Placement Confirmation: ETT inserted through vocal cords under direct vision,  positive ETCO2 and breath sounds checked- equal and bilateral Secured at: 20 cm Tube secured with: Tape Dental Injury: Teeth and Oropharynx as per pre-operative assessment

## 2015-01-23 NOTE — Progress Notes (Signed)
H and P on chart No changes LSC BTL Consent signed

## 2015-01-23 NOTE — Anesthesia Postprocedure Evaluation (Signed)
  Anesthesia Post-op Note  Patient: Kaitlyn Sawyer  Procedure(s) Performed: Procedure(s) (LRB): LAPAROSCOPIC TUBAL LIGATION (Bilateral)  Patient Location: PACU  Anesthesia Type: General  Level of Consciousness: awake and alert   Airway and Oxygen Therapy: Patient Spontanous Breathing  Post-op Pain: mild  Post-op Assessment: Post-op Vital signs reviewed, Patient's Cardiovascular Status Stable, Respiratory Function Stable, Patent Airway and No signs of Nausea or vomiting  Last Vitals:  Filed Vitals:   01/23/15 0900  BP: 126/72  Pulse: 62  Temp:   Resp: 15    Post-op Vital Signs: stable   Complications: No apparent anesthesia complications

## 2015-01-23 NOTE — Transfer of Care (Signed)
Last Vitals:  Filed Vitals:   01/23/15 0602  BP: 158/107  Pulse: 90  Temp: 36.4 C  Resp: 16   Immediate Anesthesia Transfer of Care Note  Patient: Kaitlyn Sawyer  Procedure(s) Performed: Procedure(s) (LRB): LAPAROSCOPIC TUBAL LIGATION (Bilateral)  Patient Location: PACU  Anesthesia Type: General  Level of Consciousness: awake, alert  and oriented  Airway & Oxygen Therapy: Patient Spontanous Breathing and Patient connected to face mask oxygen  Post-op Assessment: Report given to PACU RN and Post -op Vital signs reviewed and stable  Post vital signs: Reviewed and stable  Complications: No apparent anesthesia complications

## 2015-01-24 ENCOUNTER — Encounter (HOSPITAL_BASED_OUTPATIENT_CLINIC_OR_DEPARTMENT_OTHER): Payer: Self-pay | Admitting: Obstetrics and Gynecology

## 2015-08-18 ENCOUNTER — Emergency Department (HOSPITAL_COMMUNITY)
Admission: EM | Admit: 2015-08-18 | Discharge: 2015-08-18 | Disposition: A | Discharge status: Home / Self Care | Payer: Medicaid Other | Attending: Emergency Medicine | Admitting: Emergency Medicine

## 2015-08-18 ENCOUNTER — Encounter (HOSPITAL_COMMUNITY): Payer: Self-pay | Admitting: Emergency Medicine

## 2015-08-18 ENCOUNTER — Emergency Department (HOSPITAL_COMMUNITY): Payer: Medicaid Other

## 2015-08-18 DIAGNOSIS — R42 Dizziness and giddiness: Secondary | ICD-10-CM | POA: Insufficient documentation

## 2015-08-18 DIAGNOSIS — Z880 Allergy status to penicillin: Secondary | ICD-10-CM | POA: Insufficient documentation

## 2015-08-18 DIAGNOSIS — R011 Cardiac murmur, unspecified: Secondary | ICD-10-CM | POA: Insufficient documentation

## 2015-08-18 DIAGNOSIS — J159 Unspecified bacterial pneumonia: Secondary | ICD-10-CM | POA: Insufficient documentation

## 2015-08-18 DIAGNOSIS — Z3202 Encounter for pregnancy test, result negative: Secondary | ICD-10-CM | POA: Insufficient documentation

## 2015-08-18 DIAGNOSIS — J189 Pneumonia, unspecified organism: Secondary | ICD-10-CM

## 2015-08-18 DIAGNOSIS — F439 Reaction to severe stress, unspecified: Secondary | ICD-10-CM | POA: Insufficient documentation

## 2015-08-18 DIAGNOSIS — R110 Nausea: Secondary | ICD-10-CM | POA: Insufficient documentation

## 2015-08-18 DIAGNOSIS — F17210 Nicotine dependence, cigarettes, uncomplicated: Secondary | ICD-10-CM | POA: Insufficient documentation

## 2015-08-18 LAB — COMPREHENSIVE METABOLIC PANEL
ALBUMIN: 4.3 g/dL (ref 3.5–5.0)
ALT: 10 U/L — ABNORMAL LOW (ref 14–54)
AST: 16 U/L (ref 15–41)
Alkaline Phosphatase: 47 U/L (ref 38–126)
Anion gap: 9 (ref 5–15)
BILIRUBIN TOTAL: 0.3 mg/dL (ref 0.3–1.2)
BUN: 10 mg/dL (ref 6–20)
CO2: 22 mmol/L (ref 22–32)
Calcium: 9.3 mg/dL (ref 8.9–10.3)
Chloride: 109 mmol/L (ref 101–111)
Creatinine, Ser: 0.75 mg/dL (ref 0.44–1.00)
GFR calc Af Amer: >60 mL/min (ref >60)
GFR calc non Af Amer: >60 mL/min (ref >60)
GLUCOSE: 104 mg/dL — AB (ref 65–99)
Potassium: 4 mmol/L (ref 3.5–5.1)
Sodium: 140 mmol/L (ref 135–145)
TOTAL PROTEIN: 7.3 g/dL (ref 6.5–8.1)

## 2015-08-18 LAB — CBC
HEMATOCRIT: 36.2 % (ref 36.0–46.0)
Hemoglobin: 11.5 g/dL — ABNORMAL LOW (ref 12.0–15.0)
MCH: 25.2 pg — ABNORMAL LOW (ref 26.0–34.0)
MCHC: 31.8 g/dL (ref 30.0–36.0)
MCV: 79.2 fL (ref 78.0–100.0)
Platelets: 359 10*3/uL (ref 150–400)
RBC: 4.57 MIL/uL (ref 3.87–5.11)
RDW: 15.7 % — AB (ref 11.5–15.5)
WBC: 6.8 10*3/uL (ref 4.0–10.5)

## 2015-08-18 LAB — URINALYSIS, ROUTINE W REFLEX MICROSCOPIC
Bilirubin Urine: NEGATIVE
GLUCOSE, UA: NEGATIVE mg/dL
HGB URINE DIPSTICK: NEGATIVE
Ketones, ur: NEGATIVE mg/dL
LEUKOCYTES UA: NEGATIVE
Nitrite: NEGATIVE
PROTEIN: NEGATIVE mg/dL
SPECIFIC GRAVITY, URINE: 1.012 (ref 1.005–1.030)
pH: 6.5 (ref 5.0–8.0)

## 2015-08-18 LAB — POC URINE PREG, ED: Preg Test, Ur: NEGATIVE

## 2015-08-18 MED ORDER — SODIUM CHLORIDE 0.9 % IV BOLUS (SEPSIS)
1000.0000 mL | Freq: Once | INTRAVENOUS | Status: AC
  Administered 2015-08-18: 1000 mL via INTRAVENOUS

## 2015-08-18 MED ORDER — DOXYCYCLINE HYCLATE 100 MG PO CAPS
100.0000 mg | ORAL_CAPSULE | Freq: Two times a day (BID) | ORAL | Status: AC
Start: 2015-08-18 — End: 2015-08-25

## 2015-08-18 NOTE — ED Notes (Signed)
Pt here with c/o dizziness and some sob , pt got up last night to make a bottle from for her baby and got dizzy , no loc

## 2015-08-18 NOTE — ED Notes (Signed)
Pt is in stable condition upon d/c and ambulates from ED. 

## 2015-08-18 NOTE — ED Provider Notes (Signed)
CSN: [id number]     Arrival date & time 08/18/15  0757 History   First MD Initiated Contact with Patient 08/18/15 [phone]     Chief Complaint  Patient presents with  . Shortness of Breath  . Dizziness     (Consider location/radiation/quality/duration/timing/severity/associated sxs/prior Treatment) Patient is a 30 y.o. female presenting with shortness of breath and dizziness.  Shortness of Breath Associated symptoms: cough (occasional)   Associated symptoms: no abdominal pain, no chest pain, no fever, no headaches, no neck pain, no rash, no sore throat and no vomiting   Dizziness Associated symptoms: nausea and shortness of breath   Associated symptoms: no blood in stool, no chest pain, no diarrhea, no headaches, no vomiting and no weakness   Lightheaded, began last night Last night stood up out of bed, and felt very lightheaded like going to pass out Thought maybe dehydrated Drank coffee before bed Felt SOB, thinks from anxiety, not feeling dyspnea now Feels like needs to take a deep breath No chest pain, no vomiting, diarrhea, black or bloody stools, eating and drinking ok, no fevers, no chills, have cough, sick 3 weeks ago and now occasional cough, drainage  +nausea  Past Medical History  Diagnosis Date  . History of abnormal cervical Pap smear     HPV-- S/P CRYOTHERPY  . History of gestational hypertension     DUE TO ECLAMPSIA  . History of postpartum depression   . Heart murmur     asymptomatic with exception during pregancy notice occ. palpitations  . History of seizure     x1  secondary to preclampsia 2011 (approx)  none since   Past Surgical History  Procedure Laterality Date  . Dilation and curettage of uterus      post partem retained placenta  . Wisdom tooth extraction  age 30  . Laparoscopic tubal ligation Bilateral 01/23/2015    Procedure: LAPAROSCOPIC TUBAL LIGATION;  Surgeon: Marcelle OverlieMichelle Grewal, MD;  [facility];  Service: Gynecology;   Laterality: Bilateral;   Family History  Problem Relation Age of Onset  . Alcohol abuse Maternal Grandfather     liver failure  . Heart disease Paternal Grandfather     "massive MI"  . Stroke Paternal Grandmother    Social History  Substance Use Topics  . Smoking status: Current Every Day Smoker -- 0.50 packs/day for 10 years    Types: Cigarettes  . Smokeless tobacco: Never Used  . Alcohol Use: Yes   OB History    Gravida Para Term Preterm AB TAB SAB Ectopic Multiple Living   4 4 4  0 0 0 0 0 0 4     Review of Systems  Constitutional: Negative for fever and appetite change.  HENT: Negative for sore throat.   Eyes: Negative for visual disturbance.  Respiratory: Positive for cough (occasional) and shortness of breath.   Cardiovascular: Negative for chest pain.  Gastrointestinal: Positive for nausea. Negative for vomiting, abdominal pain, diarrhea and blood in stool.  Genitourinary: Negative for difficulty urinating.  Musculoskeletal: Negative for back pain and neck pain.  Skin: Negative for rash.  Neurological: Positive for dizziness and light-headedness. Negative for syncope, weakness and headaches.      Allergies  Diclegis and Penicillins  Home Medications   Prior to Admission medications   Medication Sig Start Date End Date Taking? Authorizing Provider  doxycycline (VIBRAMYCIN) 100 MG capsule Take 1 capsule (100 mg total) by mouth 2 (two) times daily. 08/18/15 08/25/15  Alvira MondayErin Clair Alfieri, MD  oxyCODONE-acetaminophen (PERCOCET) 10-325 MG tablet Take 1 tablet by mouth every 4 (four) hours as needed for pain. 01/23/15   Marcelle Overlie, MD   BP 116/69 mmHg  Pulse 71  Temp(Src) 98.2 F (36.8 C)  Resp 13  SpO2 100%  LMP 07/26/2015 (Approximate) Physical Exam  Constitutional: She is oriented to person, place, and time. She appears well-developed and well-nourished. No distress.  HENT:  Head: Normocephalic and atraumatic.  Eyes: Conjunctivae and EOM are normal.  Neck:  Normal range of motion.  Cardiovascular: Normal rate, regular rhythm, normal heart sounds and intact distal pulses.  Exam reveals no gallop and no friction rub.   No murmur heard. Pulmonary/Chest: Effort normal and breath sounds normal. No respiratory distress. She has no wheezes. She has no rales.  Abdominal: Soft. She exhibits no distension. There is no tenderness. There is no guarding.  Musculoskeletal: She exhibits no edema or tenderness.  Neurological: She is alert and oriented to person, place, and time.  Skin: Skin is warm and dry. No rash noted. She is not diaphoretic. No erythema.  Nursing note and vitals reviewed.   ED Course  Procedures (including critical care time) Labs Review Labs Reviewed  CBC - Abnormal; Notable for the following:    Hemoglobin 11.5 (*)    MCH 25.2 (*)    RDW 15.7 (*)    All other components within normal limits  COMPREHENSIVE METABOLIC PANEL - Abnormal; Notable for the following:    Glucose, Bld 104 (*)    ALT 10 (*)    All other components within normal limits  URINALYSIS, ROUTINE W REFLEX MICROSCOPIC (NOT AT [facility])  POC URINE PREG, ED    Imaging Review Dg Chest 2 View  08/18/2015  CLINICAL DATA:  Cough.  Shortness of breath . EXAM: CHEST  2 VIEW COMPARISON:  No recent prior. FINDINGS: Mediastinum and hilar structures normal. Mild left infrahilar infiltrate cannot be excluded. No pleural effusion or pneumothorax. Heart size normal. No acute bony abnormality IMPRESSION: Mild left infrahilar infiltrate cannot be excluded. Electronically Signed   By: Maisie Fus  Register   On: 08/18/2015 09:08   I have personally reviewed and evaluated these images and lab results as part of my medical decision-making.   EKG Interpretation   Date/Time:  Monday Aug 18 2015 09:45:02 EDT Ventricular Rate:  58 PR Interval:  139 QRS Duration: 96 QT Interval:  480 QTC Calculation: 471 R Axis:   50 Text Interpretation:  Sinus rhythm RSR' in V1 or V2, right VCD or RVH   Borderline T abnormalities, inferior leads No previous ECGs available  Confirmed by [facility] MD, Calloway Andrus ([id number]) on 08/18/2015 10:29:54 AM      MDM   Final diagnoses:  Lightheadedness  Stress  CAP (community acquired pneumonia)   30 year old female with no significant medical history presents with concern for lightheadedness.  EKG evaluated by me and shows sinus rhythm with no sign of brugada, no sign of HOCM, no delta waves, no ST abnormalities.  Computer QTc borderline, however calculated less and doubt prolonged QTc.  Patient without tachypnea, no hypoxia, no PE risk factors, PERC negative and doubt pulmonary embolus. Reports transient dyspnea which was likely anxiety.  Labs show no sign of anemia, no elect to abnormalities. Pregnancy test is negative and urinalysis shows no sign of UTI.   Chest x-ray shows possible left infrahilar infiltrate, and while patient's cough is improving, reports several weeks of coughing, and will treat for possible community acquired pneumonia with doxycycline. Patient also reports  a tick was briefly attached to her shoulder, however denies any other associated symptoms, and denies tick engorgement and reports immediate tick removal and doubt tick-borne illness, however doxycycline may also cover for prophylaxis.  Patient reports significant amount of stress at home which may also contribute to symptoms.  Recommend PCP follow up. Patient discharged in stable condition with understanding of reasons to return.     Alvira Monday, MD 08/18/15 1735

## 2015-08-18 NOTE — Discharge Instructions (Signed)

## 2016-03-19 ENCOUNTER — Encounter (HOSPITAL_BASED_OUTPATIENT_CLINIC_OR_DEPARTMENT_OTHER): Payer: Self-pay | Admitting: Respiratory Therapy

## 2016-03-19 ENCOUNTER — Emergency Department (HOSPITAL_BASED_OUTPATIENT_CLINIC_OR_DEPARTMENT_OTHER)
Admission: EM | Admit: 2016-03-19 | Discharge: 2016-03-19 | Disposition: A | Discharge status: Home / Self Care | Payer: Self-pay | Attending: Emergency Medicine | Admitting: Emergency Medicine

## 2016-03-19 ENCOUNTER — Emergency Department (HOSPITAL_BASED_OUTPATIENT_CLINIC_OR_DEPARTMENT_OTHER): Payer: Self-pay

## 2016-03-19 DIAGNOSIS — F17210 Nicotine dependence, cigarettes, uncomplicated: Secondary | ICD-10-CM | POA: Insufficient documentation

## 2016-03-19 DIAGNOSIS — D649 Anemia, unspecified: Secondary | ICD-10-CM | POA: Insufficient documentation

## 2016-03-19 LAB — CBC WITH DIFFERENTIAL/PLATELET
BASOS ABS: 0 10*3/uL (ref 0.0–0.1)
BASOS PCT: 0 %
EOS PCT: 1 %
Eosinophils Absolute: 0.1 10*3/uL (ref 0.0–0.7)
HCT: 31.8 % — ABNORMAL LOW (ref 36.0–46.0)
Hemoglobin: 10.4 g/dL — ABNORMAL LOW (ref 12.0–15.0)
Lymphocytes Relative: 25 %
Lymphs Abs: 1.8 10*3/uL (ref 0.7–4.0)
MCH: 26.1 pg (ref 26.0–34.0)
MCHC: 32.7 g/dL (ref 30.0–36.0)
MCV: 79.9 fL (ref 78.0–100.0)
MONO ABS: 0.4 10*3/uL (ref 0.1–1.0)
MONOS PCT: 5 %
Neutro Abs: 4.9 10*3/uL (ref 1.7–7.7)
Neutrophils Relative %: 69 %
PLATELETS: 359 10*3/uL (ref 150–400)
RBC: 3.98 MIL/uL (ref 3.87–5.11)
RDW: 16 % — AB (ref 11.5–15.5)
WBC: 7.1 10*3/uL (ref 4.0–10.5)

## 2016-03-19 LAB — BASIC METABOLIC PANEL
ANION GAP: 7 (ref 5–15)
BUN: 10 mg/dL (ref 6–20)
CALCIUM: 9.1 mg/dL (ref 8.9–10.3)
CO2: 26 mmol/L (ref 22–32)
CREATININE: 0.62 mg/dL (ref 0.44–1.00)
Chloride: 107 mmol/L (ref 101–111)
GLUCOSE: 96 mg/dL (ref 65–99)
Potassium: 3.9 mmol/L (ref 3.5–5.1)
Sodium: 140 mmol/L (ref 135–145)

## 2016-03-19 MED ORDER — FERROUS SULFATE 325 (65 FE) MG PO TABS: 325.0000 mg | ORAL_TABLET | Freq: Every day | ORAL | 2 refills | Status: DC

## 2016-03-19 NOTE — ED Provider Notes (Signed)
[facility] Provider Note   CSN: [id number] Arrival date & time: 03/19/16  0907     History   Chief Complaint Chief Complaint  Patient presents with  . Shortness of Breath    HPI Kaitlyn Sawyer is a 30 y.o. female.  The history is provided by the patient. No language interpreter was used.  Shortness of Breath  This is a new problem. The problem occurs rarely.The problem has been gradually worsening. Pertinent negatives include no fever. The problem's precipitants include fumes. She has tried nothing for the symptoms. The treatment provided no relief. She has had no prior hospitalizations.  Pt reports she began feeling short of breath today.   Pt reports she  Had jerking in her left leg 2 nights ago.  Pt reports she googled what could be wrong with her and now she feels anxious.   Past Medical History:  Diagnosis Date  . Heart murmur    asymptomatic with exception during pregancy notice occ. palpitations  . History of abnormal cervical Pap smear    HPV-- S/P CRYOTHERPY  . History of gestational hypertension    DUE TO ECLAMPSIA  . History of postpartum depression   . History of seizure    x1  secondary to preclampsia 2011 (approx)  none since    Patient Active Problem List   Diagnosis Date Noted  . SVD (spontaneous vaginal delivery) 11/30/2014  . NSVD (normal spontaneous vaginal delivery) 10/02/2013  . Pregnancy 10/01/2013    Past Surgical History:  Procedure Laterality Date  . DILATION AND CURETTAGE OF UTERUS     post partem retained placenta  . LAPAROSCOPIC TUBAL LIGATION Bilateral 01/23/2015   Procedure: LAPAROSCOPIC TUBAL LIGATION;  Surgeon: Marcelle OverlieMichelle Grewal, MD;  [facility] [place];  Service: Gynecology;  Laterality: Bilateral;  . WISDOM TOOTH EXTRACTION  age 30    OB History    Gravida Para Term Preterm AB Living   4 4 4  0 0 4   SAB TAB Ectopic Multiple Live Births   0 0 0 0 4       Home Medications    Prior to  Admission medications   Medication Sig Start Date End Date Taking? Authorizing Provider  ferrous sulfate 325 (65 FE) MG tablet Take 1 tablet (325 mg total) by mouth daily. 03/19/16   Elson AreasLeslie K Rahkim Rabalais, PA-C  oxyCODONE-acetaminophen (PERCOCET) 10-325 MG tablet Take 1 tablet by mouth every 4 (four) hours as needed for pain. 01/23/15   Marcelle OverlieMichelle Grewal, MD    Family History Family History  Problem Relation Age of Onset  . Alcohol abuse Maternal Grandfather     liver failure  . Heart disease Paternal Grandfather     "massive MI"  . Stroke Paternal Grandmother     Social History Social History  Substance Use Topics  . Smoking status: Current Every Day Smoker    Packs/day: 0.50    Years: 10.00    Types: Cigarettes  . Smokeless tobacco: Never Used  . Alcohol use Yes     Allergies   Diclegis [doxylamine-pyridoxine] and Penicillins   Review of Systems Review of Systems  Constitutional: Negative for fever.  Respiratory: Positive for shortness of breath.   All other systems reviewed and are negative.    Physical Exam Updated Vital Signs BP 111/84 (BP Location: Right Arm)   Pulse 79   Temp 98 F (36.7 C) (Oral)   Resp (!) 27   LMP 03/15/2016   SpO2 99%   Physical  Exam  Constitutional: She appears well-developed and well-nourished.  HENT:  Head: Normocephalic and atraumatic.  Right Ear: External ear normal.  Left Ear: External ear normal.  Nose: Nose normal.  Mouth/Throat: Oropharynx is clear and moist.  Eyes: Conjunctivae and EOM are normal. Pupils are equal, round, and reactive to light.  Neck: Normal range of motion.  Cardiovascular: Normal rate and regular rhythm.   Pulmonary/Chest: Effort normal and breath sounds normal.  Abdominal: Soft.  Musculoskeletal: Normal range of motion.  Neurological: She is alert.  Skin: Skin is warm.  Psychiatric: She has a normal mood and affect.  Nursing note and vitals reviewed.    ED Treatments / Results  Labs (all labs  ordered are listed, but only abnormal results are displayed) Labs Reviewed  CBC WITH DIFFERENTIAL/PLATELET - Abnormal; Notable for the following:       Result Value   Hemoglobin 10.4 (*)    HCT 31.8 (*)    RDW 16.0 (*)    All other components within normal limits  BASIC METABOLIC PANEL    EKG  EKG Interpretation  Date/Time:  Friday March 19 2016 09:24:51 EST Ventricular Rate:  82 PR Interval:    QRS Duration: 94 QT Interval:  396 QTC Calculation: 463 R Axis:   21 Text Interpretation:  Sinus rhythm RSR' in V1 or V2, right VCD or RVH No significant change since last tracing Confirmed by [facility]  MD, WHITNEY ([id number]) on 03/19/2016 11:00:20 AM       Radiology Dg Chest 2 View  Result Date: 03/19/2016 CLINICAL DATA:  Chest pain and cough. Lightheaded and shortness of breath. Smoker. EXAM: CHEST  2 VIEW COMPARISON:  08/18/2015 FINDINGS: The cardiomediastinal silhouette is within normal limits. The lungs are well inflated and clear. There is no evidence of pleural effusion or pneumothorax. No acute osseous abnormality is identified. IMPRESSION: No active cardiopulmonary disease. Electronically Signed   By: Sebastian AcheAllen  Grady M.D.   On: 03/19/2016 09:50    Procedures Procedures (including critical care time)  Medications Ordered in ED Medications - No data to display   Initial Impression / Assessment and Plan / ED Course  I have reviewed the triage vital signs and the nursing notes.  Pertinent labs & imaging results that were available during my care of the patient were reviewed by me and considered in my medical decision making (see chart for details).  Clinical Course as of Mar 19 1401  Fri Mar 19, 2016  1129 Hemoglobin: (!) 10.4 [LS]    Clinical Course User Index [LS] Elson AreasLeslie K Shantella Blubaugh, PA-C    Pt counseled on anemia.  Pt has had before.  Pt given rx for iron.  Pt advised to see her MD for recheck.  Final Clinical Impressions(s) / ED Diagnoses   Final diagnoses:  Anemia,  unspecified type    New Prescriptions Discharge Medication List as of 03/19/2016 11:32 AM    START taking these medications   Details  ferrous sulfate 325 (65 FE) MG tablet Take 1 tablet (325 mg total) by mouth daily., Starting Fri 03/19/2016, Print         Elson AreasLeslie K Tykira Wachs, PA-C 03/19/16 1412    Elson AreasLeslie K Dael Howland, PA-C 03/19/16 1436    Lonia SkinnerLeslie K TownsendSofia, PA-C 03/19/16 1437    Gwyneth SproutWhitney Plunkett, MD 03/19/16 1553

## 2016-03-19 NOTE — Discharge Instructions (Signed)
See your Physician for recheck.   Your hemoglobin is  low at 10.4

## 2016-03-19 NOTE — ED Triage Notes (Signed)
Pt with SOB and chest pain for several days, denies cough, denies N/V

## 2017-01-06 HISTORY — PX: OTHER SURGICAL HISTORY: SHX169

## 2017-01-07 ENCOUNTER — Encounter (HOSPITAL_COMMUNITY): Payer: Self-pay | Admitting: *Deleted

## 2017-01-07 ENCOUNTER — Inpatient Hospital Stay (HOSPITAL_COMMUNITY)
Admission: AD | Admit: 2017-01-07 | Discharge: 2017-01-07 | Disposition: A | Discharge status: Home / Self Care | Payer: BLUE CROSS/BLUE SHIELD | Source: Ambulatory Visit | Attending: Obstetrics and Gynecology | Admitting: Obstetrics and Gynecology

## 2017-01-07 DIAGNOSIS — Z880 Allergy status to penicillin: Secondary | ICD-10-CM | POA: Diagnosis not present

## 2017-01-07 DIAGNOSIS — F17210 Nicotine dependence, cigarettes, uncomplicated: Secondary | ICD-10-CM | POA: Insufficient documentation

## 2017-01-07 DIAGNOSIS — N719 Inflammatory disease of uterus, unspecified: Secondary | ICD-10-CM | POA: Diagnosis not present

## 2017-01-07 DIAGNOSIS — R509 Fever, unspecified: Secondary | ICD-10-CM | POA: Diagnosis present

## 2017-01-07 LAB — CBC WITH DIFFERENTIAL/PLATELET
BASOS PCT: 0 %
Basophils Absolute: 0 10*3/uL (ref 0.0–0.1)
EOS ABS: 0 10*3/uL (ref 0.0–0.7)
Eosinophils Relative: 0 %
HEMATOCRIT: 34.3 % — AB (ref 36.0–46.0)
Hemoglobin: 11.3 g/dL — ABNORMAL LOW (ref 12.0–15.0)
Lymphocytes Relative: 11 %
Lymphs Abs: 1 10*3/uL (ref 0.7–4.0)
MCH: 27.5 pg (ref 26.0–34.0)
MCHC: 32.9 g/dL (ref 30.0–36.0)
MCV: 83.5 fL (ref 78.0–100.0)
MONO ABS: 0.3 10*3/uL (ref 0.1–1.0)
MONOS PCT: 3 %
NEUTROS ABS: 7.8 10*3/uL — AB (ref 1.7–7.7)
Neutrophils Relative %: 86 %
Platelets: 265 10*3/uL (ref 150–400)
RBC: 4.11 MIL/uL (ref 3.87–5.11)
RDW: 14.6 % (ref 11.5–15.5)
WBC: 9.1 10*3/uL (ref 4.0–10.5)

## 2017-01-07 LAB — URINALYSIS, ROUTINE W REFLEX MICROSCOPIC
BILIRUBIN URINE: NEGATIVE
GLUCOSE, UA: NEGATIVE mg/dL
HGB URINE DIPSTICK: NEGATIVE
KETONES UR: NEGATIVE mg/dL
Leukocytes, UA: NEGATIVE
Nitrite: NEGATIVE
PROTEIN: NEGATIVE mg/dL
Specific Gravity, Urine: 1.013 (ref 1.005–1.030)
pH: 6 (ref 5.0–8.0)

## 2017-01-07 LAB — POCT PREGNANCY, URINE: PREG TEST UR: NEGATIVE

## 2017-01-07 MED ORDER — LACTATED RINGERS IV SOLN
INTRAVENOUS | Status: DC
  Administered 2017-01-07: 18:00:00 via INTRAVENOUS

## 2017-01-07 MED ORDER — DOXYCYCLINE HYCLATE 100 MG PO CAPS: 100.0000 mg | ORAL_CAPSULE | Freq: Two times a day (BID) | ORAL | 0 refills | Status: DC

## 2017-01-07 MED ORDER — CEFOTETAN DISODIUM 2 G IJ SOLR
2.0000 g | Freq: Once | INTRAMUSCULAR | Status: AC
  Administered 2017-01-07: 2 g via INTRAVENOUS
  Filled 2017-01-07: qty 2

## 2017-01-07 NOTE — MAU Provider Note (Signed)
History     CSN: [id number]  Arrival date and time: 01/07/17 1620  Chief Complaint  Patient presents with  . Fever  . Abdominal Pain  . Back Pain   Non-pregnant female here with fever. Started around 1500 today. Temp was 103. She took Ibuprofen and came down to 102. She had ablation yesterday morning. No VB. No urinary sx until the last few hours she feels sharp pain with urination. Some lower abdominal cramping since procedure yesterday.   Past Medical History:  Diagnosis Date  . Heart murmur    asymptomatic with exception during pregancy notice occ. palpitations  . History of abnormal cervical Pap smear    HPV-- S/P CRYOTHERPY  . History of gestational hypertension    DUE TO ECLAMPSIA  . History of postpartum depression   . History of seizure    x1  secondary to preclampsia 2011 (approx)  none since    Past Surgical History:  Procedure Laterality Date  . DILATION AND CURETTAGE OF UTERUS     post partem retained placenta  . LAPAROSCOPIC TUBAL LIGATION Bilateral 01/23/2015   Procedure: LAPAROSCOPIC TUBAL LIGATION;  Surgeon: Marcelle Overlie, MD;  Location: [facility] [place];  Service: Gynecology;  Laterality: Bilateral;  . uterine ablasion  01/06/2017  . WISDOM TOOTH EXTRACTION  age 29    Family History  Problem Relation Age of Onset  . Alcohol abuse Maternal Grandfather        liver failure  . Heart disease Paternal Grandfather        "massive MI"  . Stroke Paternal Grandmother     Social History  Substance Use Topics  . Smoking status: Current Every Day Smoker    Packs/day: 0.50    Years: 10.00    Types: Cigarettes  . Smokeless tobacco: Never Used  . Alcohol use Yes     Comment: social    Allergies:  Allergies  Allergen Reactions  . Diclegis [Doxylamine-Pyridoxine] Hives, Itching and Rash  . Penicillins Swelling and Rash    Has patient had a PCN reaction causing immediate rash, facial/tongue/throat swelling, SOB or lightheadedness with  hypotension: no Has patient had a PCN reaction causing severe rash involving mucus membranes or skin necrosis: unknown Has patient had a PCN reaction that required hospitalization:no Has patient had a PCN reaction occurring within the last 10 years:no If all of the above answers are "NO", then may proceed with Cephalosporin use.     Prescriptions Prior to Admission  Medication Sig Dispense Refill Last Dose  . ferrous sulfate 325 (65 FE) MG tablet Take 1 tablet (325 mg total) by mouth daily. 30 tablet 2 Past Week at Unknown time  . ibuprofen (ADVIL,MOTRIN) 600 MG tablet Take 600 mg by mouth every 6 (six) hours as needed for moderate pain.   01/07/2017 at Unknown time  . oxyCODONE-acetaminophen (PERCOCET) 10-325 MG tablet Take 1 tablet by mouth every 4 (four) hours as needed for pain. 30 tablet 0 01/06/2017 at Unknown time    Review of Systems  Constitutional: Negative for chills and fever.  Respiratory: Negative.   Gastrointestinal: Positive for abdominal pain. Anal bleeding: cramping.  Genitourinary: Positive for frequency. Negative for dysuria, vaginal bleeding and vaginal discharge.   Physical Exam   Blood pressure 103/76, pulse (!) 101, temperature 99.3 F (37.4 C), temperature source Oral, resp. rate 20, last menstrual period 12/30/2016, SpO2 100 %, currently breastfeeding.  Physical Exam  Nursing note and vitals reviewed. Constitutional: She is oriented to person, place, and time.  She appears well-developed and well-nourished.  HENT:  Head: Normocephalic and atraumatic.  Neck: Normal range of motion.  Respiratory: Effort normal. No respiratory distress.  GI: Soft. She exhibits no distension and no mass. There is tenderness in the right lower quadrant, suprapubic area and left lower quadrant. There is no rebound and no guarding.  Musculoskeletal: Normal range of motion.  Neurological: She is alert and oriented to person, place, and time.  Skin: Skin is warm and dry.   Psychiatric: She has a normal mood and affect.    Results for orders placed or performed during the hospital encounter of 01/07/17 (from the past 24 hour(s))  Urinalysis, Routine w reflex microscopic     Status: None   Collection Time: 01/07/17  4:45 PM  Result Value Ref Range   Color, Urine YELLOW YELLOW   APPearance CLEAR CLEAR   Specific Gravity, Urine 1.013 1.005 - 1.030   pH 6.0 5.0 - 8.0   Glucose, UA NEGATIVE NEGATIVE mg/dL   Hgb urine dipstick NEGATIVE NEGATIVE   Bilirubin Urine NEGATIVE NEGATIVE   Ketones, ur NEGATIVE NEGATIVE mg/dL   Protein, ur NEGATIVE NEGATIVE mg/dL   Nitrite NEGATIVE NEGATIVE   Leukocytes, UA NEGATIVE NEGATIVE  Pregnancy, urine POC     Status: None   Collection Time: 01/07/17  4:53 PM  Result Value Ref Range   Preg Test, Ur NEGATIVE NEGATIVE  CBC with Differential/Platelet     Status: Abnormal   Collection Time: 01/07/17  5:27 PM  Result Value Ref Range   WBC 9.1 4.0 - 10.5 K/uL   RBC 4.11 3.87 - 5.11 MIL/uL   Hemoglobin 11.3 (L) 12.0 - 15.0 g/dL   HCT 86.5 (L) 78.4 - 69.6 %   MCV 83.5 78.0 - 100.0 fL   MCH 27.5 26.0 - 34.0 pg   MCHC 32.9 30.0 - 36.0 g/dL   RDW 29.5 28.4 - 13.2 %   Platelets 265 150 - 400 K/uL   Neutrophils Relative % 86 %   Neutro Abs 7.8 (H) 1.7 - 7.7 K/uL   Lymphocytes Relative 11 %   Lymphs Abs 1.0 0.7 - 4.0 K/uL   Monocytes Relative 3 %   Monocytes Absolute 0.3 0.1 - 1.0 K/uL   Eosinophils Relative 0 %   Eosinophils Absolute 0.0 0.0 - 0.7 K/uL   Basophils Relative 0 %   Basophils Absolute 0.0 0.0 - 0.1 K/uL   MAU Course  Procedures Cefotetan 2g IV  MDM Labs ordered and reviewed. Presentation and clinical findings discussed with Dr. Renaldo Fiddler, plan for one dose Cefotetan now and po Doxy. Stable for discharge home.  Assessment and Plan   1. Endometritis    Discharge home Follow up in GYN office on 01/10/17 Rx Doxy Tylenol/Ibuprofen prn Return precautions  Allergies as of 01/07/2017      Reactions    Diclegis [doxylamine-pyridoxine] Hives, Itching, Rash   Penicillins Swelling, Rash   Has patient had a PCN reaction causing immediate rash, facial/tongue/throat swelling, SOB or lightheadedness with hypotension: no Has patient had a PCN reaction causing severe rash involving mucus membranes or skin necrosis: unknown Has patient had a PCN reaction that required hospitalization:no Has patient had a PCN reaction occurring within the last 10 years:no If all of the above answers are "NO", then may proceed with Cephalosporin use.      Medication List    TAKE these medications   doxycycline 100 MG capsule Commonly known as:  VIBRAMYCIN Take 1 capsule (100 mg total) by mouth  2 (two) times daily.   ferrous sulfate 325 (65 FE) MG tablet Take 1 tablet (325 mg total) by mouth daily.   ibuprofen 600 MG tablet Commonly known as:  ADVIL,MOTRIN Take 600 mg by mouth every 6 (six) hours as needed for moderate pain.   oxyCODONE-acetaminophen 10-325 MG tablet Commonly known as:  PERCOCET Take 1 tablet by mouth every 4 (four) hours as needed for pain.            Discharge Care Instructions        Start     Ordered   01/07/17 0000  Discharge patient    Question Answer Comment  Discharge disposition 01-Home or Self Care   Discharge patient date 01/07/2017      01/07/17 1816   01/07/17 0000  doxycycline (VIBRAMYCIN) 100 MG capsule  2 times daily    Question:  Supervising Provider  Answer:  Adam Phenix   01/07/17 1816     Donette Larry, CNM 01/07/2017, 6:41 PM

## 2017-01-07 NOTE — Discharge Instructions (Signed)
Endometritis °Endometritis is irritation, soreness, or inflammation that affects the lining of the uterus (endometrium). °Infection is usually the cause of endometritis. It is important to get treatment to prevent complications. Common complications may include more severe infections and not being able to have children(infertility). °What are the causes? °This condition may be caused by: °· Bacterial infections. °· STIs (sexually transmitted infections). °· A miscarriage or childbirth, especially after a long labor or cesarean delivery. °· Certain gynecological procedures. These may include dilation and curettage (D&C), hysteroscopy, or birth control (contraceptive) insertion. °· Tuberculosis (TB). ° °What are the signs or symptoms? °Symptoms of this condition include: °· Fever. °· Lower abdomen (abdominal) pain. °· Pelvis (pelvic) pain. °· Abnormal vaginal discharge or bleeding. °· Abdominal bloating (distention) or swelling. °· General discomfort or generally feeling ill. °· Discomfort with bowel movements. °· Constipation. ° °How is this diagnosed? °This condition may be diagnosed based on: °· A physical exam, including a pelvic exam. °· Tests, such as: °? Blood tests. °? Removal of a sample of endometrial tissue for testing (endometrial biopsy). °? Examining a sample of vaginal discharge under a microscope (wet prep). °? Removal of a sample of fluid from the cervix for testing (cervical culture). °? Surgical examination of the pelvis and abdomen. ° °How is this treated? °This condition is treated with: °· Antibiotic medicines. °· For more severe cases, hospitalization may be needed to give fluids and antibiotics directly into a vein through an IV tube. ° °Follow these instructions at home: °· Take over-the-counter and prescription medicines only as told by your health care provider. °· Drink enough fluid to keep your urine clear or pale yellow. °· Take your antibiotic medicine as told by your health care  provider. Do not stop taking the antibiotic even if you start to feel better. °· Do not douche or have sex (including vaginal, oral, and anal sex) until your health care provider approves. °· If your endometritis was caused by an STI, do not have sex (including vaginal, oral, and anal sex) until your partner has also been treated for the STI. °· Return to your normal activities as told by your health care provider. Ask your health care provider what activities are safe for you. °· Keep all follow-up visits as told by your health care provider. This is important. °Contact a health care provider if: °· You have pain that does not get better with medicine. °· You have a fever. °· You have pain with bowel movements. °Get help right away if: °· You have abdominal swelling. °· You have abdominal pain that gets worse. °· You have bad-smelling vaginal discharge, or an increased amount of vaginal discharge. °· You have abnormal vaginal bleeding. °· You have nausea and vomiting. °Summary °· Endometritis affects the lining of the uterus (endometrium) and is usually caused by an infection. °· It is important to get treatment to prevent complications. °· You have several treatment options for endometritis. Treatment may include antibiotics and IV fluids. °· Take your antibiotic medicine as told by your health care provider. Do not stop taking the antibiotic even if you start to feel better. °· Do not douche or have sex (including vaginal, oral, and anal sex) until your health care provider approves. °This information is not intended to replace advice given to you by your health care provider. Make sure you discuss any questions you have with your health care provider. °Document Released: 03/30/2001 Document Revised: 04/20/2016 Document Reviewed: 04/20/2016 °Elsevier Interactive Patient Education © 2017   [employer] ° °

## 2017-01-07 NOTE — MAU Note (Signed)
Pt present with c/o fever, lower abdominal cramping, and lower back pain.  Pt states s/p uterine ablasion yesterday.  States fever 103 @ 1400 this afternoon, took Ibuprofen.  Denies VB.

## 2017-05-20 ENCOUNTER — Other Ambulatory Visit: Payer: Self-pay

## 2017-05-20 ENCOUNTER — Emergency Department (INDEPENDENT_AMBULATORY_CARE_PROVIDER_SITE_OTHER)
Admission: EM | Admit: 2017-05-20 | Discharge: 2017-05-20 | Disposition: A | Discharge status: Home / Self Care | Payer: 59 | Source: Home / Self Care | Attending: Family Medicine | Admitting: Family Medicine

## 2017-05-20 ENCOUNTER — Encounter: Payer: Self-pay | Admitting: Emergency Medicine

## 2017-05-20 DIAGNOSIS — M940 Chondrocostal junction syndrome [Tietze]: Secondary | ICD-10-CM

## 2017-05-20 DIAGNOSIS — K219 Gastro-esophageal reflux disease without esophagitis: Secondary | ICD-10-CM

## 2017-05-20 DIAGNOSIS — F419 Anxiety disorder, unspecified: Secondary | ICD-10-CM

## 2017-05-20 MED ORDER — ESOMEPRAZOLE MAGNESIUM 40 MG PO CPDR: DELAYED_RELEASE_CAPSULE | ORAL | 1 refills | Status: DC

## 2017-05-20 MED ORDER — PREDNISONE 20 MG PO TABS: ORAL_TABLET | ORAL | 0 refills | Status: DC

## 2017-05-20 NOTE — Discharge Instructions (Signed)
Put ice on the painful area: Put ice in a plastic bag. Place a towel between your skin and the bag. Leave the ice on for 20 minutes, 2-3 times a day.  Eat small, frequent meals instead of large meals. Avoid drinking large amounts of liquid with your meals. Avoid eating meals during the 2-3 hours before bedtime. Avoid lying down right after you eat. Do not exercise right after you eat.

## 2017-05-20 NOTE — ED Provider Notes (Signed)
[facility]    CSN: [id number] Arrival date & time: 05/20/17  1707     History   Chief Complaint Chief Complaint  Patient presents with  . Chest Pain  . Anxiety  . Cough    HPI Kaitlyn Sawyer is a 32 y.o. female.   Patient complains of one week history of intermittent sub-sternal chest discomfort that does not radiate.  She admits that she also has anxiety, which tends to exacerbate her chest symptoms. She reports that she also has a history of intermittent GERD symptoms.  She frequently has a nocturnal cough which is present upon awakening and improves during the day.  Three days ago she began taking OTC Nexium 24 hr, but has not yet felt improvement.  She has a family history of heart disease (paternal grandfather).  She does not smoke. She was evaluated for similar symptoms 01/12/17 at the [facility] ED where a cardiac evaluation and also chest CTA were negative, and she was diagnosed with costochondritis and anxiety.   The history is provided by the patient.  Chest Pain  Pain location:  Substernal area Pain quality: burning   Pain radiates to:  Does not radiate Pain severity:  Moderate Onset quality:  Sudden Timing:  Sporadic Progression:  Waxing and waning Chronicity:  Chronic Context: breathing and movement   Relieved by:  Nothing Worsened by:  Coughing, certain positions and deep breathing Ineffective treatments:  None tried Associated symptoms: AICD problem, anxiety, cough and heartburn   Associated symptoms: no abdominal pain, no anorexia, no back pain, no claudication, no diaphoresis, no dizziness, no dysphagia, no fatigue, no fever, no headache, no lower extremity edema, no nausea, no numbness, no palpitations, no PND, no shortness of breath, no syncope and no vomiting   Risk factors: no prior DVT/PE     Past Medical History:  Diagnosis Date  . Heart murmur    asymptomatic with exception during pregancy notice occ. palpitations  .  History of abnormal cervical Pap smear    HPV-- S/P CRYOTHERPY  . History of gestational hypertension    DUE TO ECLAMPSIA  . History of postpartum depression   . History of seizure    x1  secondary to preclampsia 2011 (approx)  none since    Patient Active Problem List   Diagnosis Date Noted  . SVD (spontaneous vaginal delivery) 11/30/2014  . NSVD (normal spontaneous vaginal delivery) 10/02/2013  . Pregnancy 10/01/2013    Past Surgical History:  Procedure Laterality Date  . DILATION AND CURETTAGE OF UTERUS     post partem retained placenta  . LAPAROSCOPIC TUBAL LIGATION Bilateral 01/23/2015   Procedure: LAPAROSCOPIC TUBAL LIGATION;  Surgeon: Marcelle OverlieMichelle Grewal, MD;  [facility];  Service: Gynecology;  Laterality: Bilateral;  . uterine ablasion  01/06/2017  . WISDOM TOOTH EXTRACTION  age 32    OB History    Gravida Para Term Preterm AB Living   4 4 4  0 0 4   SAB TAB Ectopic Multiple Live Births   0 0 0 0 4       Home Medications    Prior to Admission medications   Medication Sig Start Date End Date Taking? Authorizing Provider  doxycycline (VIBRAMYCIN) 100 MG capsule Take 1 capsule (100 mg total) by mouth 2 (two) times daily. 01/07/17   Donette LarryBhambri, Melanie, CNM  esomeprazole (NEXIUM) 40 MG capsule Take one cap by mouth once daily, 30 minutes before a meal. 05/20/17   Cathren HarshBeese, Tera MaterStephen A, MD  ferrous sulfate 325 (65 FE) MG tablet Take 1 tablet (325 mg total) by mouth daily. 03/19/16   Elson Areas, PA-C  ibuprofen (ADVIL,MOTRIN) 600 MG tablet Take 600 mg by mouth every 6 (six) hours as needed for moderate pain.    [provider]  oxyCODONE-acetaminophen (PERCOCET) 10-325 MG tablet Take 1 tablet by mouth every 4 (four) hours as needed for pain. 01/23/15   Marcelle Overlie, MD  predniSONE (DELTASONE) 20 MG tablet Take one tab by mouth twice daily for 4 days, then one daily for 3 days. Take with food. 05/20/17   Lattie Haw, MD    Family  History Family History  Problem Relation Age of Onset  . Alcohol abuse Maternal Grandfather        liver failure  . Heart disease Paternal Grandfather        "massive MI"  . Stroke Paternal Grandmother     Social History Social History   Tobacco Use  . Smoking status: Current Every Day Smoker    Packs/day: 0.50    Years: 10.00    Pack years: 5.00    Types: Cigarettes  . Smokeless tobacco: Never Used  Substance Use Topics  . Alcohol use: Yes    Comment: social  . Drug use: No     Allergies   Diclegis [doxylamine-pyridoxine] and Penicillins   Review of Systems Review of Systems  Constitutional: Negative for diaphoresis, fatigue and fever.  HENT: Negative for trouble swallowing.   Respiratory: Positive for cough. Negative for shortness of breath.   Cardiovascular: Positive for chest pain. Negative for palpitations, claudication, syncope and PND.  Gastrointestinal: Positive for heartburn. Negative for abdominal pain, anorexia, nausea and vomiting.  Musculoskeletal: Negative for back pain.  Neurological: Negative for dizziness, numbness and headaches.  All other systems reviewed and are negative.    Physical Exam Triage Vital Signs ED Triage Vitals  Enc Vitals Group     BP 05/20/17 1750 118/78     Pulse Rate 05/20/17 1750 86     Resp 05/20/17 1750 16     Temp 05/20/17 1750 97.8 F (36.6 C)     Temp Source 05/20/17 1750 Oral     SpO2 05/20/17 1750 99 %     Weight 05/20/17 1751 150 lb (68 kg)     Height 05/20/17 1751 5\' 3"  (1.6 m)     Head Circumference --      Peak Flow --      Pain Score --      Pain Loc --      Pain Edu? --      Excl. in GC? --    No data found.  Updated Vital Signs BP 118/78 (BP Location: Right Arm)   Pulse 86   Temp 97.8 F (36.6 C) (Oral)   Resp 16   Ht 5\' 3"  (1.6 m)   Wt 150 lb (68 kg)   SpO2 99%   Breastfeeding? No   BMI 26.57 kg/m   Visual Acuity Right Eye Distance:   Left Eye Distance:   Bilateral Distance:     Right Eye Near:   Left Eye Near:    Bilateral Near:     Physical Exam  Constitutional: She appears well-developed and well-nourished. No distress.  HENT:  Head: Normocephalic.  Right Ear: External ear normal.  Left Ear: External ear normal.  Nose: Nose normal.  Mouth/Throat: Oropharynx is clear and moist.  Eyes: Conjunctivae are normal. Pupils are equal, round, and reactive to  light.  Neck: Neck supple. No thyromegaly present.  Cardiovascular: Normal heart sounds.  Pulmonary/Chest: Breath sounds normal. She exhibits bony tenderness.  Chest:  There is distinct tenderness to palpation over the entire sternum as noted on diagram.    Abdomen:  Sub-xiphoid tenderness to palpation as noted on diagram.     Abdominal: Soft. Bowel sounds are normal.  Musculoskeletal: She exhibits no edema or tenderness.  Lymphadenopathy:    She has no cervical adenopathy.  Neurological: She is alert.  Skin: Skin is warm and dry. No rash noted.  Nursing note and vitals reviewed.    UC Treatments / Results  Labs (all labs ordered are listed, but only abnormal results are displayed) Labs Reviewed - No data to display  EKG  EKG Interpretation   Rate:  73 BPM PR:  140 msec QT:  420 msec QTcH:  462 msec QRSD:  90 msec QRS axis:  29 degrees Interpretation:  Normal sinus rhythm; within normal limits        Radiology No results found.  Procedures Procedures (including critical care time)  Medications Ordered in UC Medications - No data to display   Initial Impression / Assessment and Plan / UC Course  I have reviewed the triage vital signs and the nursing notes.  Pertinent labs & imaging results that were available during my care of the patient were reviewed by me and considered in my medical decision making (see chart for details).    Normal EKG reassuring.  Patient appears to have a combination of costochondritis and GERD, exacerbated by anxiety.  Begin prednisone burst/taper  for costochondritis.    Put ice on the painful area: ? Put ice in a plastic bag. ? Place a towel between your skin and the bag. ? Leave the ice on for 20 minutes, 2-3 times a day.  Begin Nexium 40mg  daily.   Eat small, frequent meals instead of large meals.  Avoid drinking large amounts of liquid with your meals.  Avoid eating meals during the 2-3 hours before bedtime.  Avoid lying down right after you eat.  Do not exercise right after you eat.   Followup with Family Doctor for management of anxiety, and possible GI referral (may need endoscopy).  If costochondritis does not improve, may need local injections.  Final Clinical Impressions(s) / UC Diagnoses   Final diagnoses:  Costochondritis  Gastroesophageal reflux disease, esophagitis presence not specified  Anxiety    ED Discharge Orders        Ordered    predniSONE (DELTASONE) 20 MG tablet     05/20/17 1814    esomeprazole (NEXIUM) 40 MG capsule     05/20/17 1814          Lattie Haw, MD 05/23/17 2239

## 2017-05-20 NOTE — ED Triage Notes (Signed)
Reports about one week intermittent chest discomfort sub-sternal; wondered if acid reflux so has tried nexium; has had mild cough past 1-2 days with none today.

## 2017-05-22 ENCOUNTER — Telehealth: Payer: Self-pay | Admitting: Emergency Medicine

## 2017-05-22 NOTE — Telephone Encounter (Signed)
Inquired about patient's status; encourage them to call with questions/concerns.  

## 2017-05-25 MED FILL — ESOMEPRAZOLE MAG DR 40 MG C: 40 | 30 days supply | Qty: 30 | Fill #0

## 2017-05-26 DIAGNOSIS — Z6825 Body mass index (BMI) 25.0-25.9, adult: Secondary | ICD-10-CM | POA: Diagnosis not present

## 2017-05-26 DIAGNOSIS — F419 Anxiety disorder, unspecified: Secondary | ICD-10-CM | POA: Diagnosis not present

## 2017-05-26 MED FILL — VENLAFAXINE HCL ER 75 MG CA: 75 | 33 days supply | Qty: 60 | Fill #0

## 2017-05-30 ENCOUNTER — Ambulatory Visit: Payer: Self-pay | Admitting: Emergency Medicine

## 2017-05-30 VITALS — BP 114/82 | HR 76 | Temp 98.8°F | Resp 18

## 2017-05-30 DIAGNOSIS — R198 Other specified symptoms and signs involving the digestive system and abdomen: Secondary | ICD-10-CM

## 2017-05-30 DIAGNOSIS — R0989 Other specified symptoms and signs involving the circulatory and respiratory systems: Secondary | ICD-10-CM

## 2017-05-30 DIAGNOSIS — F458 Other somatoform disorders: Secondary | ICD-10-CM

## 2017-05-30 DIAGNOSIS — K219 Gastro-esophageal reflux disease without esophagitis: Secondary | ICD-10-CM

## 2017-05-30 MED ORDER — RANITIDINE HCL 300 MG PO TABS: 300.0000 mg | ORAL_TABLET | Freq: Every day | ORAL | 0 refills | Status: DC

## 2017-05-30 NOTE — Progress Notes (Signed)
S: Kaitlyn Sawyer is a 32 y.o. female who presents with a chief complaint of throat pain and the sensation of "something" being in her throat. These symptoms have been ongoing for 2-3 weeks, was seen and evaluated at urgent care earlier in the month and her PPI was increased. She denies history of fever, chills, n/v/d/ myalgias, or other complaints.   Review of Systems  Constitutional: Negative for chills, fever and malaise/fatigue.  HENT: Positive for sore throat.   Respiratory: Negative for cough and wheezing.   Cardiovascular: Negative for chest pain and palpitations.  Gastrointestinal: Positive for heartburn. Negative for abdominal pain, constipation, diarrhea, nausea and vomiting.  Musculoskeletal: Negative.   Neurological: Negative.    O:  Vitals:   05/30/17 1622  BP: 114/82  Pulse: 76  Resp: 18  Temp: 98.8 F (37.1 C)  SpO2: 98%   Physical Exam  Constitutional: She appears well-developed and well-nourished. No distress.  HENT:  Head: Normocephalic and atraumatic.  Right Ear: Tympanic membrane and external ear normal.  Left Ear: Tympanic membrane and external ear normal.  Nose: Nose normal.  Mouth/Throat: Uvula is midline and oropharynx is clear and moist. No oropharyngeal exudate, posterior oropharyngeal edema or posterior oropharyngeal erythema. Tonsils are 0 on the right. Tonsils are 0 on the left. No tonsillar exudate.  Eyes: Conjunctivae are normal.  Neck: Normal range of motion. Neck supple. No thyromegaly present.  Cardiovascular: Normal rate and regular rhythm.  Pulmonary/Chest: Effort normal and breath sounds normal.  Abdominal: Soft.  Lymphadenopathy:       Head (right side): No submental, no submandibular, no tonsillar, no preauricular and no posterior auricular adenopathy present.       Head (left side): No submental, no submandibular, no tonsillar, no preauricular and no posterior auricular adenopathy present.    She has no cervical adenopathy.   Right cervical: No superficial cervical, no deep cervical and no posterior cervical adenopathy present.      Left cervical: No superficial cervical, no deep cervical and no posterior cervical adenopathy present.  Skin: Skin is warm. Capillary refill takes less than 2 seconds. She is not diaphoretic.  Psychiatric: She has a normal mood and affect.  Nursing note and vitals reviewed.  A: 1. Globus sensation   2. Gastroesophageal reflux disease, esophagitis presence not specified    P: Symptoms consistent with globus sensation, physial exam findings are reassuring, provided counseling regarding diagnoses and treatment. Continue the PPI at the elevated dose, starting Zantac nightly as well. Recommend follow up with GI in 2-3 weeks as needed if symptoms persist.

## 2017-05-30 NOTE — Patient Instructions (Signed)

## 2017-06-01 ENCOUNTER — Encounter: Payer: Self-pay | Admitting: Physician Assistant

## 2017-06-01 ENCOUNTER — Ambulatory Visit (INDEPENDENT_AMBULATORY_CARE_PROVIDER_SITE_OTHER): Payer: 59 | Admitting: Physician Assistant

## 2017-06-01 VITALS — BP 110/62 | HR 74 | Ht 63.0 in | Wt 146.0 lb

## 2017-06-01 DIAGNOSIS — R1310 Dysphagia, unspecified: Secondary | ICD-10-CM

## 2017-06-01 DIAGNOSIS — R079 Chest pain, unspecified: Secondary | ICD-10-CM | POA: Diagnosis not present

## 2017-06-01 DIAGNOSIS — R0989 Other specified symptoms and signs involving the circulatory and respiratory systems: Secondary | ICD-10-CM

## 2017-06-01 DIAGNOSIS — F458 Other somatoform disorders: Secondary | ICD-10-CM | POA: Diagnosis not present

## 2017-06-01 DIAGNOSIS — K219 Gastro-esophageal reflux disease without esophagitis: Secondary | ICD-10-CM | POA: Diagnosis not present

## 2017-06-01 DIAGNOSIS — R198 Other specified symptoms and signs involving the digestive system and abdomen: Secondary | ICD-10-CM

## 2017-06-01 MED ORDER — ESOMEPRAZOLE MAGNESIUM 40 MG PO CPDR: DELAYED_RELEASE_CAPSULE | ORAL | 1 refills | Status: DC

## 2017-06-01 NOTE — Progress Notes (Signed)
Subjective:    Patient ID: Kaitlyn Sawyer, female    DOB: January 09, 1986, 32 y.o.   MRN: [id number]  HPI Kaitlyn Sawyer is a pleasant 32 year old white female, new to GI today, self-referred for evaluation of his severe heartburn. Patient has not had any prior GI evaluation. She says her symptoms started over the past 2 months and had become very bad over the past few weeks. She was seen in the emergency room in [place] 1 05/20/2017, felt to have a combination of GERD and possibly costochondritis. She was placed on Nexium 20 mg every morning and Zantac at bedtime. She continues to have difficulty and was seen by her PCP on 05/30/2017 and Nexium was increased to 40 mg every morning. She said earlier this week she felt terrible had burning discomfort all the way up into her throat and neck which precipitated that visit. She's also been having a lot of problems with anxiety, and just started on Effexor about a week ago. Over the past month she is having daily symptoms of burning in her chest, and at times up into her throat. She also has a sensation of feeling like there is something stuck in her throat. She is having some difficulty swallowing pills feeling like they are sitting for prolonged period of time in her esophagus. She says her food is also been very slow to go down her esophagus. She has some intermittent hoarseness. She has been afraid to eat and has lost about 4 pounds. No regular aspirin or NSAIDs, no recent antibiotics, changes in vitamin supplements etc. no family history of GI disease. She is a nonsmoker.  Review of Systems Pertinent positive and negative review of systems were noted in the above HPI section.  All other review of systems was otherwise negative.  Outpatient Encounter Medications as of 06/01/2017  Medication Sig  . esomeprazole (NEXIUM) 40 MG capsule Take one cap by mouth before breakfast and dinner.  . ranitidine (ZANTAC) 300 MG tablet Take 1 tablet (300 mg total) by  mouth at bedtime.  Marland Kitchen. venlafaxine (EFFEXOR) 37.5 MG tablet Take 37.5 mg by mouth daily.  . [DISCONTINUED] esomeprazole (NEXIUM) 40 MG capsule Take one cap by mouth once daily, 30 minutes before a meal.  . [DISCONTINUED] ferrous sulfate 325 (65 FE) MG tablet Take 1 tablet (325 mg total) by mouth daily.  . [DISCONTINUED] ibuprofen (ADVIL,MOTRIN) 600 MG tablet Take 600 mg by mouth every 6 (six) hours as needed for moderate pain.  . [DISCONTINUED] oxyCODONE-acetaminophen (PERCOCET) 10-325 MG tablet Take 1 tablet by mouth every 4 (four) hours as needed for pain.  . [DISCONTINUED] doxycycline (VIBRAMYCIN) 100 MG capsule Take 1 capsule (100 mg total) by mouth 2 (two) times daily. (Patient not taking: Reported on 05/30/2017)  . [DISCONTINUED] predniSONE (DELTASONE) 20 MG tablet Take one tab by mouth twice daily for 4 days, then one daily for 3 days. Take with food. (Patient not taking: Reported on 06/01/2017)   No facility-administered encounter medications on file as of 06/01/2017.    Allergies  Allergen Reactions  . Diclegis [Doxylamine-Pyridoxine] Hives, Itching and Rash  . Penicillins Swelling and Rash    Has patient had a PCN reaction causing immediate rash, facial/tongue/throat swelling, SOB or lightheadedness with hypotension: no Has patient had a PCN reaction causing severe rash involving mucus membranes or skin necrosis: unknown Has patient had a PCN reaction that required hospitalization:no Has patient had a PCN reaction occurring within the last 10 years:no If all of the above answers are "  NO", then may proceed with Cephalosporin use.    Patient Active Problem List   Diagnosis Date Noted  . SVD (spontaneous vaginal delivery) 11/30/2014  . NSVD (normal spontaneous vaginal delivery) 10/02/2013  . Pregnancy 10/01/2013   Social History   Socioeconomic History  . Marital status: Divorced    Spouse name: Not on file  . Number of children: Not on file  . Years of education: Not on file  .  Highest education level: Not on file  Social Needs  . Financial resource strain: Not on file  . Food insecurity - worry: Not on file  . Food insecurity - inability: Not on file  . Transportation needs - medical: Not on file  . Transportation needs - non-medical: Not on file  Occupational History  . Not on file  Tobacco Use  . Smoking status: Current Every Day Smoker    Packs/day: 0.50    Years: 10.00    Pack years: 5.00    Types: Cigarettes  . Smokeless tobacco: Never Used  Substance and Sexual Activity  . Alcohol use: Yes    Comment: social  . Drug use: No  . Sexual activity: Yes  Other Topics Concern  . Not on file  Social History Narrative  . Not on file    Ms. Spahr's family history includes Alcohol abuse in her maternal grandfather; Heart disease in her paternal grandfather; Stroke in her paternal grandmother.      Objective:    Vitals:   06/01/17 1439  BP: 110/62  Pulse: 74    Physical Exam well-developed young white female in no acute distress, pleasant blood pressure 110/62 pulse 74, height 5 foot 3, weight 146, BMI 25.8. HEENT; nontraumatic, cephalic EOMI PERRLA sclera anicteric, Cardiovascular; with S1-S2 no murmur rub or gallop, there is no definite chest wall tenderness, pulmonary ;clear bilaterally, Abdomen; soft, nontender nondistended bowel sounds are active there is no palpable mass or hepatosplenomegaly, Rectal ;exam not done, Extremities; no clubbing cyanosis or edema skin warm and dry, Neuropsych; mood and affect appropriate       Assessment & Plan:   #24 32 year old female with 2 month history of progressive now daily heartburn, chest discomfort radiating up into her throat, globus sensation and mild dysphagia. Symptoms are consistent with poorly controlled GERD, she may have him prone and of acute esophagitis, we'll also rule out eosinophilic esophagitis with the severity of symptoms #2 anxiety acute and chronic  Plan; strict antireflux regimen  was reviewed with the patient, including elevation of the head of the bed, nothing by mouth for 3 hours prior to bedtime and she was provided with an antireflux diet We will increase Nexium to 40 mg by mouth twice daily short term, new prescription sent, she can continue to use Zantac when necessary for breakthrough symptoms Will schedule for upper endoscopy with Dr. Russella Dar. Procedure was discussed in detail with the patient including indications risks and benefits and she is agreeable to proceed.  Stepan Verrette S Quentavious Rittenhouse PA-C 06/01/2017   Cc: No ref. provider found

## 2017-06-01 NOTE — Progress Notes (Signed)
Reviewed and agree with management plan.  Marianita Botkin T. Kobie Whidby, MD FACG 

## 2017-06-01 NOTE — Patient Instructions (Signed)
You have been scheduled for an endoscopy. Please follow written instructions given to you at your visit today. If you use inhalers (even only as needed), please bring them with you on the day of your procedure. Your physician has requested that you go to www.startemmi.com and enter the access code given to you at your visit today. This web site gives a general overview about your procedure. However, you should still follow specific instructions given to you by our office regarding your preparation for the procedure.  We have given you strict antireflux regimine handout.

## 2017-06-02 ENCOUNTER — Encounter: Payer: Self-pay | Admitting: Gastroenterology

## 2017-06-08 ENCOUNTER — Ambulatory Visit: Payer: 59 | Admitting: Osteopathic Medicine

## 2017-06-10 ENCOUNTER — Encounter: Payer: 59 | Admitting: Gastroenterology

## 2017-06-14 ENCOUNTER — Encounter: Payer: Self-pay | Admitting: Internal Medicine

## 2017-06-14 ENCOUNTER — Other Ambulatory Visit: Payer: Self-pay

## 2017-06-14 ENCOUNTER — Ambulatory Visit (AMBULATORY_SURGERY_CENTER): Payer: 59 | Admitting: Internal Medicine

## 2017-06-14 VITALS — BP 107/72 | HR 63 | Temp 98.6°F | Resp 15 | Ht 63.0 in | Wt 146.0 lb

## 2017-06-14 DIAGNOSIS — K219 Gastro-esophageal reflux disease without esophagitis: Secondary | ICD-10-CM

## 2017-06-14 MED ORDER — SODIUM CHLORIDE 0.9 % IV SOLN: 500.0000 mL | Freq: Once | INTRAVENOUS | Status: DC

## 2017-06-14 MED FILL — ESOMEPRAZOLE MAG DR 40 MG C: 40 | 30 days supply | Qty: 60 | Fill #0

## 2017-06-14 NOTE — Progress Notes (Signed)
Pt's states no medical or surgical changes since previsit or office visit. 

## 2017-06-14 NOTE — Progress Notes (Signed)
Report given to PACU, vss 

## 2017-06-14 NOTE — Patient Instructions (Addendum)
The esophagus, stomach and duodenum all look normal.  I suspect stress has had a lot to do with how you have been feeling.  I expect things to continue to improve.  Please call soon and make an appointment to see me in April or May.  In the meantime once you get to a point that you feel good x 1 month reduce the nexium to once a day.  Contact me by My Chart anytime in between.  I appreciate the opportunity to care for you. Iva Booparl E. Nichael Ehly, MD, FACG YOU HAD AN ENDOSCOPIC PROCEDURE TODAY AT THE [facility]:   Refer to the procedure report that was given to you for any specific questions about what was found during the examination.  If the procedure report does not answer your questions, please call your gastroenterologist to clarify.  If you requested that your care partner not be given the details of your procedure findings, then the procedure report has been included in a sealed envelope for you to review at your convenience later.  YOU SHOULD EXPECT: Some feelings of bloating in the abdomen. Passage of more gas than usual.  Walking can help get rid of the air that was put into your GI tract during the procedure and reduce the bloating. If you had a lower endoscopy (such as a colonoscopy or flexible sigmoidoscopy) you may notice spotting of blood in your stool or on the toilet paper. If you underwent a bowel prep for your procedure, you may not have a normal bowel movement for a few days.  Please Note:  You might notice some irritation and congestion in your nose or some drainage.  This is from the oxygen used during your procedure.  There is no need for concern and it should clear up in a day or so.  SYMPTOMS TO REPORT IMMEDIATELY:   Following upper endoscopy (EGD)  Vomiting of blood or coffee ground material  New chest pain or pain under the shoulder blades  Painful or persistently difficult swallowing  New shortness of breath  Fever of 100F or higher  Black,  tarry-looking stools  For urgent or emergent issues, a gastroenterologist can be reached at any hour by calling [phone].   DIET:  We do recommend a small meal at first, but then you may proceed to your regular diet.  Drink plenty of fluids but you should avoid alcoholic beverages for 24 hours.  ACTIVITY:  You should plan to take it easy for the rest of today and you should NOT DRIVE or use heavy machinery until tomorrow (because of the sedation medicines used during the test).    FOLLOW UP: Our staff will call the number listed on your records the next business day following your procedure to check on you and address any questions or concerns that you may have regarding the information given to you following your procedure. If we do not reach you, we will leave a message.  However, if you are feeling well and you are not experiencing any problems, there is no need to return our call.  We will assume that you have returned to your regular daily activities without incident.  If any biopsies were taken you will be contacted by phone or by letter within the next 1-3 weeks.  Please call us at [phone] if you have not heard about the biopsies in 3 weeks.    SIGNATURES/CONFIDENTIALITY: You and/or your care partner have signed paperwork which will be entered into  your electronic medical record.  These signatures attest to the fact that that the information above on your After Visit Summary has been reviewed and is understood.  Full responsibility of the confidentiality of this discharge information lies with you and/or your care-partner.

## 2017-06-14 NOTE — Op Note (Signed)
[facility] Patient Name: Kaitlyn AlfredKatherine Sawyer Procedure Date: 06/14/2017 8:28 AM MRN: [id number] Endoscopist: Iva Booparl E Wilford Merryfield , MD Age: 32 Referring MD:  Date of Birth: 24-Apr-1985 Gender: Female Account #: [id number] Procedure:                Upper GI endoscopy Indications:              Heartburn, Globus sensation Medicines:                Propofol per Anesthesia, Monitored Anesthesia Care Procedure:                Pre-Anesthesia Assessment:                           - Prior to the procedure, a History and Physical                            was performed, and patient medications and                            allergies were reviewed. The patient's tolerance of                            previous anesthesia was also reviewed. The risks                            and benefits of the procedure and the sedation                            options and risks were discussed with the patient.                            All questions were answered, and informed consent                            was obtained. Prior Anticoagulants: The patient has                            taken no previous anticoagulant or antiplatelet                            agents. ASA Grade Assessment: II - A patient with                            mild systemic disease. After reviewing the risks                            and benefits, the patient was deemed in                            satisfactory condition to undergo the procedure.                           After obtaining informed consent, the endoscope was  passed under direct vision. Throughout the                            procedure, the patient's blood pressure, pulse, and                            oxygen saturations were monitored continuously. The                            Endoscope was introduced through the mouth, and                            advanced to the second part of duodenum. The upper   GI endoscopy was accomplished without difficulty.                            The patient tolerated the procedure well. Scope In: Scope Out: Findings:                 The esophagus was normal.                           The stomach was normal.                           The examined duodenum was normal.                           The cardia and gastric fundus were normal on                            retroflexion. Complications:            No immediate complications. Estimated Blood Loss:     Estimated blood loss: none. Impression:               - Normal esophagus.                           - Normal stomach.                           - Normal examined duodenum.                           - No specimens collected. Recommendation:           - Patient has a contact number available for                            emergencies. The signs and symptoms of potential                            delayed complications were discussed with the                            patient. Return to normal activities tomorrow.  Written discharge instructions were provided to the                            patient.                           - Resume previous diet.                           - Continue present medications.                           - Call son and make an appointment to see Dr.                            Leone Sawyer in about 2 mos                           Once feeling good x 1 month try to reduce to 1                            Nexium daily Iva Boop, MD 06/14/2017 8:54:25 AM This report has been signed electronically.

## 2017-06-15 ENCOUNTER — Telehealth: Payer: Self-pay | Admitting: *Deleted

## 2017-06-15 ENCOUNTER — Telehealth: Payer: Self-pay

## 2017-06-15 NOTE — Telephone Encounter (Signed)
Left message

## 2017-06-15 NOTE — Telephone Encounter (Signed)
No answer, message left for the patient. 

## 2017-06-20 DIAGNOSIS — R05 Cough: Secondary | ICD-10-CM | POA: Diagnosis not present

## 2017-06-20 DIAGNOSIS — J209 Acute bronchitis, unspecified: Secondary | ICD-10-CM | POA: Diagnosis not present

## 2017-06-21 MED FILL — AZITHROMYCIN 250 MG TABS: 250 | 5 days supply | Qty: 6 | Fill #0

## 2017-06-24 DIAGNOSIS — F419 Anxiety disorder, unspecified: Secondary | ICD-10-CM | POA: Diagnosis not present

## 2017-06-24 DIAGNOSIS — Z6825 Body mass index (BMI) 25.0-25.9, adult: Secondary | ICD-10-CM | POA: Diagnosis not present

## 2017-06-24 MED FILL — VENLAFAXINE HCL ER 150 MG C: 150 | 90 days supply | Qty: 90 | Fill #0

## 2017-07-04 ENCOUNTER — Ambulatory Visit: Payer: 59 | Admitting: Adult Health

## 2017-09-06 MED FILL — ESOMEPRAZOLE MAG DR 40 MG C: 40 | 30 days supply | Qty: 60 | Fill #1

## 2017-09-16 DIAGNOSIS — Z113 Encounter for screening for infections with a predominantly sexual mode of transmission: Secondary | ICD-10-CM | POA: Diagnosis not present

## 2017-09-16 DIAGNOSIS — Z6827 Body mass index (BMI) 27.0-27.9, adult: Secondary | ICD-10-CM | POA: Diagnosis not present

## 2017-09-16 DIAGNOSIS — N760 Acute vaginitis: Secondary | ICD-10-CM | POA: Diagnosis not present

## 2017-09-16 DIAGNOSIS — Z01419 Encounter for gynecological examination (general) (routine) without abnormal findings: Secondary | ICD-10-CM | POA: Diagnosis not present

## 2017-09-16 MED FILL — DOXYCYCLINE HYCLATE 100 MG: 100 | 7 days supply | Qty: 14 | Fill #0

## 2017-09-16 MED FILL — PHENTERMINE 37.5 MG TABLET: 37.5 | 30 days supply | Qty: 30 | Fill #0

## 2017-09-26 ENCOUNTER — Ambulatory Visit: Payer: Self-pay | Admitting: Nurse Practitioner

## 2017-09-26 VITALS — BP 114/74 | HR 82 | Temp 98.7°F | Resp 18 | Wt 158.2 lb

## 2017-09-26 DIAGNOSIS — J309 Allergic rhinitis, unspecified: Secondary | ICD-10-CM

## 2017-09-26 DIAGNOSIS — Z20818 Contact with and (suspected) exposure to other bacterial communicable diseases: Secondary | ICD-10-CM

## 2017-09-26 DIAGNOSIS — J029 Acute pharyngitis, unspecified: Secondary | ICD-10-CM

## 2017-09-26 LAB — POCT RAPID STREP A (OFFICE): RAPID STREP A SCREEN: NEGATIVE

## 2017-09-26 MED ORDER — MONTELUKAST SODIUM 10 MG PO TABS: 10.0000 mg | ORAL_TABLET | Freq: Every day | ORAL | 0 refills | Status: DC

## 2017-09-26 MED ORDER — AZITHROMYCIN 250 MG PO TABS: ORAL_TABLET | ORAL | 0 refills | Status: AC

## 2017-09-26 MED FILL — MONTELUKAST SOD 10 MG TAB: 10 | 30 days supply | Qty: 30 | Fill #0

## 2017-09-26 MED FILL — AZITHROMYCIN 250 MG TABS: 250 | 5 days supply | Qty: 6 | Fill #0

## 2017-09-26 NOTE — Progress Notes (Signed)
Subjective:     Kaitlyn Sawyer is a 32 y.o. female who presents for evaluation of sore throat. Associated symptoms include headache, post nasal drip, sinus and nasal congestion, sore throat and swollen glands. Onset of symptoms was 3 days ago, and have been gradually worsening since that time. She is drinking plenty of fluids. She has had a recent close exposure to someone with proven streptococcal pharyngitis.  Patient has not taken any medications for her symptoms to date.  The following portions of the patient's history were reviewed and updated as appropriate: allergies, current medications and past medical history.  Review of Systems Constitutional: positive for chills and fatigue, negative for anorexia, fevers, malaise, sweats and weight loss Eyes: negative Ears, nose, mouth, throat, and face: positive for nasal congestion, sore throat and bilateral ear pain/pressure, negative for ear drainage, earaches and hoarseness Respiratory: positive for cough, negative for asthma, chronic bronchitis, dyspnea on exertion, emphysema, pleurisy/chest pain, pneumonia, sputum, stridor and wheezing Cardiovascular: negative Gastrointestinal: negative Neurological: positive for headaches, negative for coordination problems, dizziness, seizures, speech problems, tremors, vertigo and weakness Allergic/Immunologic: negative for hay fever    Objective:    BP 114/74 (BP Location: Right Arm, Patient Position: Sitting, Cuff Size: Normal)   Pulse 82   Temp 98.7 F (37.1 C) (Oral)   Resp 18   Wt 158 lb 3.2 oz (71.8 kg)   SpO2 98%   BMI 28.02 kg/m  General appearance: alert, cooperative, fatigued and no distress Head: Normocephalic, without obvious abnormality, atraumatic Eyes: conjunctivae/corneas clear. PERRL, EOM's intact. Fundi benign. Ears: normal TM's and external ear canals both ears Nose: no discharge, turbinates swollen, inflamed, mild maxillary sinus tenderness bilateral, mild frontal sinus  tenderness bilateral Throat: abnormal findings: mild oropharyngeal erythema and no exudates Lungs: clear to auscultation bilaterally Heart: regular rate and rhythm, S1, S2 normal, no murmur, click, rub or gallop Abdomen: soft, non-tender; bowel sounds normal; no masses,  no organomegaly Pulses: 2+ and symmetric Skin: Skin color, texture, turgor normal. No rashes or lesions Lymph nodes: right cervical adenopathy Neurologic: Grossly normal  Laboratory Strep test done. Results:negative.    Assessment:    Acute pharyngitis, likely  Viral Pharyngitis and Allergic Rhinitis.    Plan:    Patient placed on antibiotics. Use of OTC analgesics recommended as well as salt water gargles. Patient instructed to take ibuprofen 800 mg every 8 hours as needed for throat pain.  Patient instructed to also use honey, tea with honey and lemon, or cough drops for throat pain or discomfort.  Patient was placed on azithromycin for strep throat prophylaxis.  Discussed at length with patient the inability to do a throat culture, and at that time she decided she would like to be treated prophylactically.  Patient encouraged to increase fluids.  Patient has no problems with reflux at this time.  Patient education provided for allergic rhinitis as she was treated patient also given instructions for pharyngitis.  Patient verbalizes understanding and has no questions at time of discharge.   Meds ordered this encounter  Medications  . azithromycin (ZITHROMAX) 250 MG tablet    Sig: Take as directed.    Dispense:  6 tablet    Refill:  0    Order Specific Question:   Supervising Provider    Answer:   Stacie GlazeJENKINS, JOHN E [[id number]]  . montelukast (SINGULAIR) 10 MG tablet    Sig: Take 1 tablet (10 mg total) by mouth at bedtime.    Dispense:  30 tablet  Refill:  0    Order Specific Question:   Supervising Provider    Answer:   Stacie Glaze [phone]

## 2017-09-26 NOTE — Patient Instructions (Signed)
Allergic Rhinitis, Adult Allergic rhinitis is an allergic reaction that affects the mucous membrane inside the nose. It causes sneezing, a runny or stuffy nose, and the feeling of mucus going down the back of the throat (postnasal drip). Allergic rhinitis can be mild to severe. There are two types of allergic rhinitis:  Seasonal. This type is also called hay fever. It happens only during certain seasons.  Perennial. This type can happen at any time of the year.  What are the causes? This condition happens when the body's defense system (immune system) responds to certain harmless substances called allergens as though they were germs.  Seasonal allergic rhinitis is triggered by pollen, which can come from grasses, trees, and weeds. Perennial allergic rhinitis may be caused by:  House dust mites.  Pet dander.  Mold spores.  What are the signs or symptoms? Symptoms of this condition include:  Sneezing.  Runny or stuffy nose (nasal congestion).  Postnasal drip.  Itchy nose.  Tearing of the eyes.  Trouble sleeping.  Daytime sleepiness.  How is this diagnosed? This condition may be diagnosed based on:  Your medical history.  A physical exam.  Tests to check for related conditions, such as: ? Asthma. ? Pink eye. ? Ear infection. ? Upper respiratory infection.  Tests to find out which allergens trigger your symptoms. These may include skin or blood tests.  How is this treated? There is no cure for this condition, but treatment can help control symptoms. Treatment may include:  Taking medicines that block allergy symptoms, such as antihistamines. Medicine may be given as a shot, nasal spray, or pill.  Avoiding the allergen.  Desensitization. This treatment involves getting ongoing shots until your body becomes less sensitive to the allergen. This treatment may be done if other treatments do not help.  If taking medicine and avoiding the allergen does not work, new,  stronger medicines may be prescribed.  Follow these instructions at home:  Find out what you are allergic to. Common allergens include smoke, dust, and pollen.  Avoid the things you are allergic to. These are some things you can do to help avoid allergens: ? Replace carpet with wood, tile, or vinyl flooring. Carpet can trap dander and dust. ? Do not smoke. Do not allow smoking in your home. ? Change your heating and air conditioning filter at least once a month. ? During allergy season:  Keep windows closed as much as possible.  Plan outdoor activities when pollen counts are lowest. This is usually during the evening hours.  When coming indoors, change clothing and shower before sitting on furniture or bedding.  Take over-the-counter and prescription medicines only as told by your health care provider.  Keep all follow-up visits as told by your health care provider. This is important. Contact a health care provider if:  You have a fever.  You develop a persistent cough.  You make whistling sounds when you breathe (you wheeze).  Your symptoms interfere with your normal daily activities. Get help right away if:  You have shortness of breath. Summary  This condition can be managed by taking medicines as directed and avoiding allergens.  Contact your health care provider if you develop a persistent cough or fever.  During allergy season, keep windows closed as much as possible. This information is not intended to replace advice given to you by your health care provider. Make sure you discuss any questions you have with your health care provider. Document Released: 12/29/2000 Document Revised: 05/13/2016  Document Reviewed: 05/13/2016 Elsevier Interactive Patient Education  2018 [employer]  Pharyngitis Pharyngitis is redness, pain, and swelling (inflammation) of the throat (pharynx). It is a very common cause of sore throat. Pharyngitis can be caused by a bacteria, but it  is usually caused by a virus. Most cases of pharyngitis get better on their own without treatment. What are the causes? This condition may be caused by:  Infection by viruses (viral). Viral pharyngitis spreads from person to person (is contagious) through coughing, sneezing, and sharing of personal items or utensils such as cups, forks, spoons, and toothbrushes.  Infection by bacteria (bacterial). Bacterial pharyngitis may be spread by touching the nose or face after coming in contact with the bacteria, or through more intimate contact, such as kissing.  Allergies. Allergies can cause buildup of mucus in the throat (post-nasal drip), leading to inflammation and irritation. Allergies can also cause blocked nasal passages, forcing breathing through the mouth, which dries and irritates the throat.  What increases the risk? You are more likely to develop this condition if:  You are 615-32 years old.  You are exposed to crowded environments such as daycare, school, or dormitory living.  You live in a cold climate.  You have a weakened disease-fighting (immune) system.  What are the signs or symptoms? Symptoms of this condition vary by the cause (viral, bacterial, or allergies) and can include:  Sore throat.  Fatigue.  Low-grade fever.  Headache.  Joint pain and muscle aches.  Skin rashes.  Swollen glands in the throat (lymph nodes).  Plaque-like film on the throat or tonsils. This is often a symptom of bacterial pharyngitis.  Vomiting.  Stuffy nose (nasal congestion).  Cough.  Red, itchy eyes (conjunctivitis).  Loss of appetite.  How is this diagnosed? This condition is often diagnosed based on your medical history and a physical exam. Your health care provider will ask you questions about your illness and your symptoms. A swab of your throat may be done to check for bacteria (rapid strep test). Other lab tests may also be done, depending on the suspected cause, but these  are rare. How is this treated? This condition usually gets better in 3-4 days without medicine. Bacterial pharyngitis may be treated with antibiotic medicines. Follow these instructions at home:  Take over-the-counter and prescription medicines only as told by your health care provider. ? If you were prescribed an antibiotic medicine, take it as told by your health care provider. Do not stop taking the antibiotic even if you start to feel better. ? Do not give children aspirin because of the association with Reye syndrome.  Drink enough water and fluids to keep your urine clear or pale yellow.  Get a lot of rest.  Gargle with a salt-water mixture 3-4 times a day or as needed. To make a salt-water mixture, completely dissolve -1 tsp of salt in 1 cup of warm water.  If your health care provider approves, you may use throat lozenges or sprays to soothe your throat. Contact a health care provider if:  You have large, tender lumps in your neck.  You have a rash.  You cough up green, yellow-brown, or bloody spit. Get help right away if:  Your neck becomes stiff.  You drool or are unable to swallow liquids.  You cannot drink or take medicines without vomiting.  You have severe pain that does not go away, even after you take medicine.  You have trouble breathing, and it is not caused by  a stuffy nose.  You have new pain and swelling in your joints such as the knees, ankles, wrists, or elbows. Summary  Pharyngitis is redness, pain, and swelling (inflammation) of the throat (pharynx).  While pharyngitis can be caused by a bacteria, the most common causes are viral.  Most cases of pharyngitis get better on their own without treatment.  Bacterial pharyngitis is treated with antibiotic medicines. This information is not intended to replace advice given to you by your health care provider. Make sure you discuss any questions you have with your health care provider. Document Released:  04/05/2005 Document Revised: 05/11/2016 Document Reviewed: 05/11/2016 Elsevier Interactive Patient Education  [employer].

## 2017-09-30 DIAGNOSIS — F419 Anxiety disorder, unspecified: Secondary | ICD-10-CM | POA: Diagnosis not present

## 2017-09-30 DIAGNOSIS — Z1331 Encounter for screening for depression: Secondary | ICD-10-CM | POA: Diagnosis not present

## 2017-10-12 DIAGNOSIS — Z3202 Encounter for pregnancy test, result negative: Secondary | ICD-10-CM | POA: Diagnosis not present

## 2017-10-12 DIAGNOSIS — N390 Urinary tract infection, site not specified: Secondary | ICD-10-CM | POA: Diagnosis not present

## 2017-10-12 DIAGNOSIS — R300 Dysuria: Secondary | ICD-10-CM | POA: Diagnosis not present

## 2017-10-13 MED FILL — PHENAZOPYRIDINE 100 MG TAB: 100 | 5 days supply | Qty: 15 | Fill #0

## 2017-10-13 MED FILL — SULFAMETHOXAZOLE-TMP DS TAB: 800-160 | 10 days supply | Qty: 20 | Fill #0

## 2017-10-13 MED FILL — VENLAFAXINE HCL ER 75 MG CA: 75 | 30 days supply | Qty: 30 | Fill #0

## 2017-10-13 MED FILL — VENLAFAXINE HCL ER 150 MG C: 150 | 90 days supply | Qty: 90 | Fill #0

## 2017-10-17 MED FILL — NITROFURANTOIN MONO-MCR 100: 100 | 10 days supply | Qty: 20 | Fill #0

## 2017-11-08 DIAGNOSIS — R82998 Other abnormal findings in urine: Secondary | ICD-10-CM | POA: Diagnosis not present

## 2017-11-08 DIAGNOSIS — N760 Acute vaginitis: Secondary | ICD-10-CM | POA: Diagnosis not present

## 2017-11-08 DIAGNOSIS — R300 Dysuria: Secondary | ICD-10-CM | POA: Diagnosis not present

## 2017-11-08 MED FILL — PHENAZOPYRIDINE 200 MG TAB: 200 | 3 days supply | Qty: 9 | Fill #0

## 2017-11-09 MED FILL — NUVESSA 1.3 % GEL: 1.3 | 1 days supply | Qty: 5 | Fill #0

## 2017-12-13 DIAGNOSIS — H5203 Hypermetropia, bilateral: Secondary | ICD-10-CM | POA: Diagnosis not present

## 2018-01-04 MED FILL — NITROFURANTOIN MONO-MCR 100: 100 | 7 days supply | Qty: 14 | Fill #0

## 2018-01-12 DIAGNOSIS — Z6829 Body mass index (BMI) 29.0-29.9, adult: Secondary | ICD-10-CM | POA: Diagnosis not present

## 2018-01-12 DIAGNOSIS — Z1339 Encounter for screening examination for other mental health and behavioral disorders: Secondary | ICD-10-CM | POA: Diagnosis not present

## 2018-01-12 DIAGNOSIS — F419 Anxiety disorder, unspecified: Secondary | ICD-10-CM | POA: Diagnosis not present

## 2018-01-12 DIAGNOSIS — Z0000 Encounter for general adult medical examination without abnormal findings: Secondary | ICD-10-CM | POA: Diagnosis not present

## 2018-01-12 DIAGNOSIS — K219 Gastro-esophageal reflux disease without esophagitis: Secondary | ICD-10-CM | POA: Diagnosis not present

## 2018-01-18 MED FILL — VENLAFAXINE HCL ER 150 MG C: 150 | 90 days supply | Qty: 90 | Fill #1

## 2018-01-19 ENCOUNTER — Telehealth: Payer: 59 | Admitting: Physician Assistant

## 2018-01-19 DIAGNOSIS — B9689 Other specified bacterial agents as the cause of diseases classified elsewhere: Secondary | ICD-10-CM

## 2018-01-19 DIAGNOSIS — J019 Acute sinusitis, unspecified: Secondary | ICD-10-CM | POA: Diagnosis not present

## 2018-01-19 MED ORDER — DOXYCYCLINE HYCLATE 100 MG PO CAPS: 100.0000 mg | ORAL_CAPSULE | Freq: Two times a day (BID) | ORAL | 0 refills | Status: DC

## 2018-01-19 NOTE — Progress Notes (Signed)

## 2018-01-25 MED FILL — DOXYCYCLINE HYCLATE 100 MG: 100 | 7 days supply | Qty: 14 | Fill #1

## 2018-01-27 ENCOUNTER — Ambulatory Visit: Payer: Self-pay | Admitting: Nurse Practitioner

## 2018-01-27 VITALS — BP 118/76 | HR 98 | Temp 98.6°F | Wt 172.2 lb

## 2018-01-27 DIAGNOSIS — H6981 Other specified disorders of Eustachian tube, right ear: Secondary | ICD-10-CM

## 2018-01-27 MED ORDER — FLUTICASONE PROPIONATE 50 MCG/ACT NA SUSP: 2.0000 | Freq: Every day | NASAL | 0 refills | Status: DC

## 2018-01-27 MED ORDER — MONTELUKAST SODIUM 10 MG PO TABS: 10.0000 mg | ORAL_TABLET | Freq: Every day | ORAL | 0 refills | Status: DC

## 2018-01-27 MED FILL — FLUTICASONE PROP 50 MCG SPR: 50 | 30 days supply | Qty: 16 | Fill #0

## 2018-01-27 MED FILL — MONTELUKAST SOD 10 MG TAB: 10 | 30 days supply | Qty: 30 | Fill #0

## 2018-01-27 NOTE — Progress Notes (Signed)
Subjective:    Patient ID: Kaitlyn Sawyer, female    DOB: 1986/04/14, 32 y.o.   MRN: [id number]  The patient is a 32 year old female who presents today for complaints of right ear pain.  The patient states her symptoms started about 3 days ago.  Patient denies any fever, chills, coughing, ear drainage, loss/change in hearing but does complain of feeling like her "head is congested".  Patient informed that she just completed antibiotics for sinus infection which improved, but then noticed her ear began hurting.  The patient states that the pain is currently 6/10, and has pain behind her ear as well.  The patient has not taken any medications for her ear pain at this time.  Otalgia   There is pain in the right ear. This is a new problem. The current episode started in the past 7 days. The problem occurs constantly. The problem has been gradually worsening. There has been no fever. The pain is at a severity of 6/10. The pain is moderate. Associated symptoms include a sore throat (scratchy throat). Pertinent negatives include no coughing, ear discharge, hearing loss or rhinorrhea. Associated symptoms comments: "I feel like I am in a fog". There is no history of a chronic ear infection or hearing loss.   I have reviewed the patient's past medical history, current medications, and allergies.   Review of Systems  Constitutional: Negative.   HENT: Positive for congestion, ear pain, sinus pressure (mild maxillary sinus pressure bilaterally) and sore throat (scratchy throat). Negative for ear discharge, hearing loss and rhinorrhea.   Eyes: Negative.   Respiratory: Negative for cough.   Skin: Negative.   Neurological: Negative.        Objective:   Physical Exam  Constitutional: She is oriented to person, place, and time. She appears well-developed and well-nourished. No distress.  HENT:  Head: Normocephalic.  Left Ear: External ear normal.  Right ear canal is normal, TM has mucoid middle ear  fluid, no bulging or erythema, no tenderness to pinna or tragus  Eyes: Pupils are equal, round, and reactive to light. EOM are normal.  Neck: Normal range of motion. Neck supple. No tracheal deviation present. No thyromegaly present.  Cardiovascular: Normal rate, regular rhythm and normal heart sounds.  Pulmonary/Chest: Effort normal and breath sounds normal.  Abdominal: Soft. Bowel sounds are normal.  Neurological: She is alert and oriented to person, place, and time.  Skin: Skin is warm and dry. No rash noted.  Psychiatric: She has a normal mood and affect. Her behavior is normal.       Assessment & Plan:  Exam findings, diagnosis etiology and medication use and indications reviewed with patient. Follow- Up and discharge instructions provided. No emergent/urgent issues found on exam. Patient education was provided. Patient verbalized understanding of information provided and agrees with plan of care (POC), all questions answered. The patient is advised to call or return to clinic if condition does not see an improvement in symptoms, or to seek the care of the closest emergency department if condition worsens with the above plan.   1. Acute dysfunction of right eustachian tube  - fluticasone (FLONASE) 50 MCG/ACT nasal spray; Place 2 sprays into both nostrils daily for 10 days.  Dispense: 16 g; Refill: 0 - montelukast (SINGULAIR) 10 MG tablet; Take 1 tablet (10 mg total) by mouth at bedtime.  Dispense: 30 tablet; Refill: 0 -Take medications as prescribed.   -May take Tylenol extra strength 500 mg 1 to 2 tablets every  6 hours as needed for ear pain. -Warm compresses to the right ear as needed for comfort. -Follow-up as there is a change in hearing, dizziness, or other concerns. -Follow-up in 48 to 72 hours if symptoms do not improve.

## 2018-01-27 NOTE — Patient Instructions (Signed)
Eustachian Tube Dysfunction -Take medications as prescribed.   -May take Tylenol extra strength 500 mg 1 to 2 tablets every 6 hours as needed for ear pain. -Warm compresses to the right ear as needed for comfort. -Follow-up as there is a change in hearing, dizziness, or other concerns. -Follow-up in 48 to 72 hours if symptoms do not improve.   The eustachian tube connects the middle ear to the back of the nose. It regulates air pressure in the middle ear by allowing air to move between the ear and nose. It also helps to drain fluid from the middle ear space. When the eustachian tube does not function properly, air pressure, fluid, or both can build up in the middle ear. Eustachian tube dysfunction can affect one or both ears. What are the causes? This condition happens when the eustachian tube becomes blocked or cannot open normally. This may result from:  Ear infections.  Colds and other upper respiratory infections.  Allergies.  Irritation, such as from cigarette smoke or acid from the stomach coming up into the esophagus (gastroesophageal reflux).  Sudden changes in air pressure, such as from descending in an airplane.  Abnormal growths in the nose or throat, such as nasal polyps, tumors, or enlarged tissue at the back of the throat (adenoids).  What increases the risk? This condition may be more likely to develop in people who smoke and people who are overweight. Eustachian tube dysfunction may also be more likely to develop in children, especially children who have:  Certain birth defects of the mouth, such as cleft palate.  Large tonsils and adenoids.  What are the signs or symptoms? Symptoms of this condition may include:  A feeling of fullness in the ear.  Ear pain.  Clicking or popping noises in the ear.  Ringing in the ear.  Hearing loss.  Loss of balance.  Symptoms may get worse when the air pressure around you changes, such as when you travel to an area of  high elevation or fly on an airplane. How is this diagnosed? This condition may be diagnosed based on:  Your symptoms.  A physical exam of your ear, nose, and throat.  Tests, such as those that measure: ? The movement of your eardrum (tympanogram). ? Your hearing (audiometry).  How is this treated? Treatment depends on the cause and severity of your condition. If your symptoms are mild, you may be able to relieve your symptoms by moving air into ("popping") your ears. If you have symptoms of fluid in your ears, treatment may include:  Decongestants.  Antihistamines.  Nasal sprays or ear drops that contain medicines that reduce swelling (steroids).  In some cases, you may need to have a procedure to drain the fluid in your eardrum (myringotomy). In this procedure, a small tube is placed in the eardrum to:  Drain the fluid.  Restore the air in the middle ear space.  Follow these instructions at home:  Take over-the-counter and prescription medicines only as told by your health care provider.  Use techniques to help pop your ears as recommended by your health care provider. These may include: ? Chewing gum. ? Yawning. ? Frequent, forceful swallowing. ? Closing your mouth, holding your nose closed, and gently blowing as if you are trying to blow air out of your nose.  Do not do any of the following until your health care provider approves: ? Travel to high altitudes. ? Fly in airplanes. ? Work in a Estate agent or room. ?  Scuba dive.  Keep your ears dry. Dry your ears completely after showering or bathing.  Do not smoke.  Keep all follow-up visits as told by your health care provider. This is important. Contact a health care provider if:  Your symptoms do not go away after treatment.  Your symptoms come back after treatment.  You are unable to pop your ears.  You have: ? A fever. ? Pain in your ear. ? Pain in your head or neck. ? Fluid draining from your  ear.  Your hearing suddenly changes.  You become very dizzy.  You lose your balance. This information is not intended to replace advice given to you by your health care provider. Make sure you discuss any questions you have with your health care provider. Document Released: 05/02/2015 Document Revised: 09/11/2015 Document Reviewed: 04/24/2014 Elsevier Interactive Patient Education  [employer].

## 2018-01-29 ENCOUNTER — Telehealth: Payer: Self-pay

## 2018-01-29 NOTE — Telephone Encounter (Signed)
Courtesy Call - Unable to leave a message on patient's voicemail - per recording, the mailbox is full and no messages can be left at this time, please try your call again at another time.

## 2018-02-06 NOTE — Telephone Encounter (Signed)
She needs a follow-up visit with me (not an APP)  She is looking for a Thursday PM vs a Friday

## 2018-02-13 MED FILL — ESOMEPRAZOLE MAG DR 40 MG C: 40 | 30 days supply | Qty: 30 | Fill #1

## 2018-02-16 NOTE — Telephone Encounter (Signed)
Please schedule appt - see documentation

## 2018-03-10 ENCOUNTER — Ambulatory Visit: Payer: 59 | Admitting: Internal Medicine

## 2018-03-29 ENCOUNTER — Telehealth: Payer: 59 | Admitting: Nurse Practitioner

## 2018-03-29 DIAGNOSIS — J069 Acute upper respiratory infection, unspecified: Secondary | ICD-10-CM | POA: Diagnosis not present

## 2018-03-29 DIAGNOSIS — H6981 Other specified disorders of Eustachian tube, right ear: Secondary | ICD-10-CM | POA: Diagnosis not present

## 2018-03-29 DIAGNOSIS — H6991 Unspecified Eustachian tube disorder, right ear: Secondary | ICD-10-CM

## 2018-03-29 MED ORDER — FLUTICASONE PROPIONATE 50 MCG/ACT NA SUSP: 2.0000 | Freq: Every day | NASAL | 0 refills | Status: DC

## 2018-03-29 MED ORDER — BENZONATATE 100 MG PO CAPS: 100.0000 mg | ORAL_CAPSULE | Freq: Three times a day (TID) | ORAL | 0 refills | Status: DC | PRN

## 2018-03-29 NOTE — Progress Notes (Signed)
We are sorry you are not feeling well.  Here is how we plan to help!  Based on what you have shared with me, it looks like you may have a viral upper respiratory infection or a "common cold".  Colds are caused by a large number of viruses; however, rhinovirus is the most common cause.   Symptoms of the common cold vary from person to person, with common symptoms including sore throat, cough, and malaise.  A low-grade fever of 100.4 may present, but is often uncommon.  Symptoms vary however, and are closely related to a person's age or underlying illnesses.  The most common symptoms associated with the common cold are nasal discharge or congestion, cough, sneezing, headache and pressure in the ears and face.  Cold symptoms usually persist for about 3 to 10 days, but can last up to 2 weeks.  It is important to know that colds do not cause serious illness or complications in most cases.    The common cold is transmitted from person to person, with the most common method of transmission being a person's hands.  The virus is able to live on the skin and can infect other persons for up to 2 hours after direct contact.  Also, colds are transmitted when someone coughs or sneezes; thus, it is important to cover the mouth to reduce this risk.  To keep the spread of the common cold at bay, good hand hygiene is very important.  This is an infection that is most likely caused by a virus. There are no specific treatments for the common cold other than to help you with the symptoms until the infection runs its course.    For nasal congestion, you may use an oral decongestants such as Mucinex D or if you have glaucoma or high blood pressure use plain Mucinex.  Saline nasal spray or nasal drops can help and can safely be used as often as needed for congestion.  For your congestion, I have prescribed Fluticasone nasal spray one spray in each nostril twice a day  If you do not have a history of heart disease, hypertension,  diabetes or thyroid disease, prostate/bladder issues or glaucoma, you may also use Sudafed to treat nasal congestion.  It is highly recommended that you consult with a pharmacist or your primary care physician to ensure this medication is safe for you to take.     If you have a cough, you may use cough suppressants such as Delsym and Robitussin.  If you have glaucoma or high blood pressure, you can also use Coricidin HBP.   For cough I have prescribed for you A prescription cough medication called Tessalon Perles 100 mg. You may take 1-2 capsules every 8 hours as needed for cough  If you have a sore or scratchy throat, use a saltwater gargle-  to  teaspoon of salt dissolved in a 4-ounce to 8-ounce glass of warm water.  Gargle the solution for approximately 15-30 seconds and then spit.  It is important not to swallow the solution.  You can also use throat lozenges/cough drops and Chloraseptic spray to help with throat pain or discomfort.  Warm or cold liquids can also be helpful in relieving throat pain.  For headache, pain or general discomfort, you can use Ibuprofen or Tylenol as directed.   Some authorities believe that zinc sprays or the use of Echinacea may shorten the course of your symptoms.   HOME CARE . Only take medications as instructed by your   medical team. . Be sure to drink plenty of fluids. Water is fine as well as fruit juices, sodas and electrolyte beverages. You may want to stay away from caffeine or alcohol. If you are nauseated, try taking small sips of liquids. How do you know if you are getting enough fluid? Your urine should be a pale yellow or almost colorless. . Get rest. . Taking a steamy shower or using a humidifier may help nasal congestion and ease sore throat pain. You can place a towel over your head and breathe in the steam from hot water coming from a faucet. . Using a saline nasal spray works much the same way. . Cough drops, hard candies and sore throat lozenges  may ease your cough. . Avoid close contacts especially the very young and the elderly . Cover your mouth if you cough or sneeze . Always remember to wash your hands.   GET HELP RIGHT AWAY IF: . You develop worsening fever. . If your symptoms do not improve within 10 days . You develop yellow or green discharge from your nose over 3 days. . You have coughing fits . You develop a severe head ache or visual changes. . You develop shortness of breath, difficulty breathing or start having chest pain . Your symptoms persist after you have completed your treatment plan  MAKE SURE YOU   Understand these instructions.  Will watch your condition.  Will get help right away if you are not doing well or get worse.  Your e-visit answers were reviewed by a board certified advanced clinical practitioner to complete your personal care plan. Depending upon the condition, your plan could have included both over the counter or prescription medications. Please review your pharmacy choice. If there is a problem, you may call our nursing hot line at and have the prescription routed to another pharmacy. Your safety is important to us. If you have drug allergies check your prescription carefully.   You can use MyChart to ask questions about today's visit, request a non-urgent call back, or ask for a work or school excuse for 24 hours related to this e-Visit. If it has been greater than 24 hours you will need to follow up with your provider, or enter a new e-Visit to address those concerns. You will get an e-mail in the next two days asking about your experience.  I hope that your e-visit has been valuable and will speed your recovery. Thank you for using e-visits.      

## 2018-04-04 MED FILL — NUVESSA 1.3 % GEL: 1.3 | 5 days supply | Qty: 5 | Fill #0

## 2018-04-06 ENCOUNTER — Ambulatory Visit: Payer: Self-pay | Admitting: Nurse Practitioner

## 2018-04-06 VITALS — BP 125/80 | HR 77 | Temp 97.9°F | Resp 18 | Wt 180.2 lb

## 2018-04-06 DIAGNOSIS — B9789 Other viral agents as the cause of diseases classified elsewhere: Secondary | ICD-10-CM

## 2018-04-06 DIAGNOSIS — J019 Acute sinusitis, unspecified: Secondary | ICD-10-CM

## 2018-04-06 MED ORDER — PSEUDOEPH-BROMPHEN-DM 30-2-10 MG/5ML PO SYRP: 5.0000 mL | ORAL_SOLUTION | Freq: Four times a day (QID) | ORAL | 0 refills | Status: AC | PRN

## 2018-04-06 MED ORDER — FLUTICASONE PROPIONATE 50 MCG/ACT NA SUSP: 2.0000 | Freq: Every day | NASAL | 0 refills | Status: DC

## 2018-04-06 MED ORDER — CETIRIZINE HCL 10 MG PO TABS: 10.0000 mg | ORAL_TABLET | Freq: Every day | ORAL | 11 refills | Status: DC

## 2018-04-06 MED FILL — BROMPHENIR-PSEUDOEPHED-DM S: 30-2-10 | 7 days supply | Qty: 150 | Fill #0

## 2018-04-06 NOTE — Patient Instructions (Signed)
Sinusitis, Adult  -Take medication as prescribed. -Ibuprofen or Tylenol for pain, fever, or general discomfort. -Increase fluids. -Sleep elevated on at least 2 pillows at bedtime to help with cough. -Use a humidifier or vaporizer when at home and during sleep. -May use a teaspoon of honey or over-the-counter cough drops to help with cough. -Need to use normal saline nasal spray to help with nasal congestion throughout the day. -Follow-up if symptoms do not improve within the next 3-5 days.    Sinusitis is inflammation of your sinuses. Sinuses are hollow spaces in the bones around your face. Your sinuses are located:  Around your eyes.  In the middle of your forehead.  Behind your nose.  In your cheekbones. Mucus normally drains out of your sinuses. When your nasal tissues become inflamed or swollen, mucus can become trapped or blocked. This allows bacteria, viruses, and fungi to grow, which leads to infection. Most infections of the sinuses are caused by a virus. Sinusitis can develop quickly. It can last for up to 4 weeks (acute) or for more than 12 weeks (chronic). Sinusitis often develops after a cold. What are the causes? This condition is caused by anything that creates swelling in the sinuses or stops mucus from draining. This includes:  Allergies.  Asthma.  Infection from bacteria or viruses.  Deformities or blockages in your nose or sinuses.  Abnormal growths in the nose (nasal polyps).  Pollutants, such as chemicals or irritants in the air.  Infection from fungi (rare). What increases the risk? You are more likely to develop this condition if you:  Have a weak body defense system (immune system).  Do a lot of swimming or diving.  Overuse nasal sprays.  Smoke. What are the signs or symptoms? The main symptoms of this condition are pain and a feeling of pressure around the affected sinuses. Other symptoms include:  Stuffy nose or congestion.  Thick  drainage from your nose.  Swelling and warmth over the affected sinuses.  Headache.  Upper toothache.  A cough that may get worse at night.  Extra mucus that collects in the throat or the back of the nose (postnasal drip).  Decreased sense of smell and taste.  Fatigue.  A fever.  Sore throat.  Bad breath. How is this diagnosed? This condition is diagnosed based on:  Your symptoms.  Your medical history.  A physical exam.  Tests to find out if your condition is acute or chronic. This may include: ? Checking your nose for nasal polyps. ? Viewing your sinuses using a device that has a light (endoscope). ? Testing for allergies or bacteria. ? Imaging tests, such as an MRI or CT scan. In rare cases, a bone biopsy may be done to rule out more serious types of fungal sinus disease. How is this treated? Treatment for sinusitis depends on the cause and whether your condition is chronic or acute.  If caused by a virus, your symptoms should go away on their own within 10 days. You may be given medicines to relieve symptoms. They include: ? Medicines that shrink swollen nasal passages (topical intranasal decongestants). ? Medicines that treat allergies (antihistamines). ? A spray that eases inflammation of the nostrils (topical intranasal corticosteroids). ? Rinses that help get rid of thick mucus in your nose (nasal saline washes).  If caused by bacteria, your health care provider may recommend waiting to see if your symptoms improve. Most bacterial infections will get better without antibiotic medicine. You may be given antibiotics  if you have: ? A severe infection. ? A weak immune system.  If caused by narrow nasal passages or nasal polyps, you may need to have surgery. Follow these instructions at home: Medicines  Take, use, or apply over-the-counter and prescription medicines only as told by your health care provider. These may include nasal sprays.  If you were  prescribed an antibiotic medicine, take it as told by your health care provider. Do not stop taking the antibiotic even if you start to feel better. Hydrate and humidify   Drink enough fluid to keep your urine pale yellow. Staying hydrated will help to thin your mucus.  Use a cool mist humidifier to keep the humidity level in your home above 50%.  Inhale steam for 10-15 minutes, 3-4 times a day, or as told by your health care provider. You can do this in the bathroom while a hot shower is running.  Limit your exposure to cool or dry air. Rest  Rest as much as possible.  Sleep with your head raised (elevated).  Make sure you get enough sleep each night. General instructions   Apply a warm, moist washcloth to your face 3-4 times a day or as told by your health care provider. This will help with discomfort.  Wash your hands often with soap and water to reduce your exposure to germs. If soap and water are not available, use hand sanitizer.  Do not smoke. Avoid being around people who are smoking (secondhand smoke).  Keep all follow-up visits as told by your health care provider. This is important. Contact a health care provider if:  You have a fever.  Your symptoms get worse.  Your symptoms do not improve within 10 days. Get help right away if:  You have a severe headache.  You have persistent vomiting.  You have severe pain or swelling around your face or eyes.  You have vision problems.  You develop confusion.  Your neck is stiff.  You have trouble breathing. Summary  Sinusitis is soreness and inflammation of your sinuses. Sinuses are hollow spaces in the bones around your face.  This condition is caused by nasal tissues that become inflamed or swollen. The swelling traps or blocks the flow of mucus. This allows bacteria, viruses, and fungi to grow, which leads to infection.  If you were prescribed an antibiotic medicine, take it as told by your health care  provider. Do not stop taking the antibiotic even if you start to feel better.  Keep all follow-up visits as told by your health care provider. This is important. This information is not intended to replace advice given to you by your health care provider. Make sure you discuss any questions you have with your health care provider. Document Released: 04/05/2005 Document Revised: 09/05/2017 Document Reviewed: 09/05/2017 Elsevier Interactive Patient Education  2019 [employer]

## 2018-04-06 NOTE — Progress Notes (Signed)
Subjective:     Kaitlyn Sawyer is a 32 y.o. female who presents for evaluation of sinus pain. Symptoms include: headaches, nasal congestion, post nasal drip, sinus pressure and sore throat. Onset of symptoms was 3 weeks ago. Symptoms have been gradually worsening since that time. Past history is significant for no history of pneumonia or bronchitis. Patient is a non-smoker.  Patient states her symptoms started a few days before Thanksgiving.  Patient states at that time she did an ED visit and received in a prescription for doxycycline.  Patient states she completed the doxycycline about a week and a half ago.  Patient states her symptoms seem to improve and then she felt like she got a "bad cold".  Patient states over the last couple of days, she is developed increased fatigue, and malaise.  Patient has been taking DayQuil since the worsening of her symptoms.  States she has had little improvement.  The following portions of the patient's history were reviewed and updated as appropriate: allergies, current medications and past medical history.  Review of Systems Constitutional: positive for fatigue and malaise, negative for anorexia, chills, night sweats and sweats Eyes: negative Ears, nose, mouth, throat, and face: positive for nasal congestion and sore throat, negative for ear drainage and earaches Respiratory: positive for cough, negative for asthma, chronic bronchitis, pneumonia, sputum, stridor and wheezing Cardiovascular: negative Gastrointestinal: negative Neurological: positive for headaches, negative for coordination problems, dizziness, seizures, speech problems, tremors, vertigo and weakness Allergic/Immunologic: positive for hay fever   Objective:    BP 125/80 (BP Location: Right Arm, Patient Position: Sitting, Cuff Size: Normal)   Pulse 77   Temp 97.9 F (36.6 C) (Oral)   Resp 18   Wt 180 lb 3.2 oz (81.7 kg)   SpO2 98%   BMI 31.92 kg/m  General appearance: alert,  cooperative, fatigued and no distress Head: Normocephalic, without obvious abnormality, atraumatic Eyes: conjunctivae/corneas clear. PERRL, EOM's intact. Fundi benign. Ears: normal TM's and external ear canals both ears Nose: no discharge, moderate congestion, turbinates swollen, inflamed, mild maxillary sinus tenderness bilateral, mild frontal sinus tenderness bilateral Throat: lips, mucosa, and tongue normal; teeth and gums normal Lungs: clear to auscultation bilaterally Heart: regular rate and rhythm, S1, S2 normal, no murmur, click, rub or gallop Abdomen: soft, non-tender; bowel sounds normal; no masses,  no organomegaly Pulses: 2+ and symmetric Skin: Skin color, texture, turgor normal. No rashes or lesions Lymph nodes: Cervical, supraclavicular, and axillary nodes normal. and cervical and submandiular nodes normal Neurologic: Grossly normal    Assessment:    Acute viral sinusitis.    Plan:   Exam findings, diagnosis etiology and medication use and indications reviewed with patient. Follow- Up and discharge instructions provided. No emergent/urgent issues found on exam.  Discussed with patient I felt that the prescription was doxycycline was a little premature.  Patient states that she did not notice any improvement with the doxycycline.  Informed patient that this was most likely because what she was experiencing was a viral infection.  Patient never attempted symptomatic treatment.  Based on the patient's previous treatment regimens, I would like to try symptomatic treatment for the next 3 to 5 days.  Informed patient that if symptoms do not improve within that timeframe to follow-up and we would consider an antibiotic course of treatment at that time.  At this time it is likely patient is still experiencing viral symptoms.  Patient has never experienced fever, purulent nasal drainage, or other signs of a bacterial infection.  Patient education was provided. Patient verbalized understanding  of information provided and agrees with plan of care (POC), all questions answered. The patient is advised to call or return to clinic if condition does not see an improvement in symptoms, or to seek the care of the closest emergency department if condition worsens with the above plan.   1. Acute viral sinusitis  - fluticasone (FLONASE) 50 MCG/ACT nasal spray; Place 2 sprays into both nostrils daily for 10 days.  Dispense: 16 g; Refill: 0 - cetirizine (ZYRTEC) 10 MG tablet; Take 1 tablet (10 mg total) by mouth daily for 14 days.  Dispense: 30 tablet; Refill: 11  -Take medication as prescribed.  -Ibuprofen or Tylenol for pain, fever, or general discomfort.  -Increase fluids.  -Sleep elevated on at least 2 pillows at bedtime to help with cough.  -Use a humidifier or vaporizer when at home and during sleep.  May use a teaspoon of honey or over-the-counter cough drops to help with cough.  -Need to use normal saline nasal spray to help with nasal congestion throughout the  day.  -Follow-up if symptoms do not improve within the next 3-5 days.

## 2018-04-13 DIAGNOSIS — H109 Unspecified conjunctivitis: Secondary | ICD-10-CM | POA: Diagnosis not present

## 2018-04-13 DIAGNOSIS — J019 Acute sinusitis, unspecified: Secondary | ICD-10-CM | POA: Diagnosis not present

## 2018-04-13 DIAGNOSIS — Z6830 Body mass index (BMI) 30.0-30.9, adult: Secondary | ICD-10-CM | POA: Diagnosis not present

## 2018-04-18 DIAGNOSIS — J101 Influenza due to other identified influenza virus with other respiratory manifestations: Secondary | ICD-10-CM | POA: Diagnosis not present

## 2018-04-18 DIAGNOSIS — Z6830 Body mass index (BMI) 30.0-30.9, adult: Secondary | ICD-10-CM | POA: Diagnosis not present

## 2018-06-05 ENCOUNTER — Telehealth: Payer: 59 | Admitting: Physician Assistant

## 2018-06-05 ENCOUNTER — Encounter: Payer: Self-pay | Admitting: Physician Assistant

## 2018-06-05 DIAGNOSIS — L0100 Impetigo, unspecified: Secondary | ICD-10-CM | POA: Diagnosis not present

## 2018-06-05 MED ORDER — MUPIROCIN 2 % EX OINT: 1.0000 "application " | TOPICAL_OINTMENT | Freq: Two times a day (BID) | CUTANEOUS | 0 refills | Status: DC

## 2018-06-05 MED FILL — MUPIROCIN 2% OINTMENT: 2 | 7 days supply | Qty: 22 | Fill #0

## 2018-06-05 NOTE — Progress Notes (Signed)
E Visit for Rash  We are sorry that you are not feeling well. Here is how we plan to help!  You may have a rash due to dermatitis , and the itching maybe cause secondary bacterial infections, thus the blistering and discharge from the rash. For this I have prescribed antibiotic cream.   I recommend you take Benadryl 25 mg - 50 mg every 4 hours to control the symptoms but if they last over 24 hours it is best that you see an office based provider for follow up.I have also prescribed topical mupiricin, apply a thing layer twice daily. If no improvement please follow up with your primary care provider.   HOME CARE:   Take cool showers and avoid direct sunlight.  Apply cool compress or wet dressings.  Take a bath in an oatmeal bath.  Sprinkle content of one Aveeno packet under running faucet with comfortably warm water.  Bathe for 15-20 minutes, 1-2 times daily.  Pat dry with a towel. Do not rub the rash.  Use hydrocortisone cream.  Take an antihistamine like Benadryl for widespread rashes that itch.  The adult dose of Benadryl is 25-50 mg by mouth 4 times daily.  Caution:  This type of medication may cause sleepiness.  Do not drink alcohol, drive, or operate dangerous machinery while taking antihistamines.  Do not take these medications if you have prostate enlargement.  Read package instructions thoroughly on all medications that you take.  GET HELP RIGHT AWAY IF:   Symptoms don't go away after treatment.  Severe itching that persists.  If you rash spreads or swells.  If you rash begins to smell.  If it blisters and opens or develops a yellow-brown crust.  You develop a fever.  You have a sore throat.  You become short of breath.  MAKE SURE YOU:  Understand these instructions. Will watch your condition. Will get help right away if you are not doing well or get worse.  Thank you for choosing an e-visit. Your e-visit answers were reviewed by a board certified advanced  clinical practitioner to complete your personal care plan. Depending upon the condition, your plan could have included both over the counter or prescription medications. Please review your pharmacy choice. Be sure that the pharmacy you have chosen is open so that you can pick up your prescription now.  If there is a problem you may message your provider in MyChart to have the prescription routed to another pharmacy. Your safety is important to [place]. If you have drug allergies check your prescription carefully.  For the next 24 hours, you can use MyChart to ask questions about today's visit, request a non-urgent call back, or ask for a work or school excuse from your e-visit provider. You will get an email in the next two days asking about your experience. I hope that your e-visit has been valuable and will speed your recovery.

## 2018-06-23 ENCOUNTER — Telehealth: Payer: 59 | Admitting: Family

## 2018-06-23 DIAGNOSIS — N760 Acute vaginitis: Secondary | ICD-10-CM | POA: Diagnosis not present

## 2018-06-23 DIAGNOSIS — B9689 Other specified bacterial agents as the cause of diseases classified elsewhere: Secondary | ICD-10-CM | POA: Diagnosis not present

## 2018-06-23 MED ORDER — METRONIDAZOLE 0.75 % VA GEL: 1.0000 | Freq: Two times a day (BID) | VAGINAL | 0 refills | Status: DC

## 2018-06-23 MED FILL — VENLAFAXINE HCL ER 150 MG C: 150 | 90 days supply | Qty: 90 | Fill #1

## 2018-06-23 MED FILL — metroNIDAZOLE 0.75 % GEL: 0.75 | 7 days supply | Qty: 70 | Fill #0

## 2018-06-23 NOTE — Progress Notes (Signed)
Greater than 5 minutes, yet less than 10 minutes of time have been spent researching, coordinating, and implementing care for this patient today.  Thank you for the details you included in the comment boxes. Those details are very helpful in determining the best course of treatment for you and help [place] to provide the best care.  Sure, we can do the [employer] gel. I sent it to your pharmacy. The standard of care is 4-5 days (basically will run out at that time).  We are sorry that you are not feeling well. Here is how we plan to help! Based on what you shared with me it looks like you: May have a vaginosis due to bacteria  Vaginosis is an inflammation of the vagina that can result in discharge, itching and pain. The cause is usually a change in the normal balance of vaginal bacteria or an infection. Vaginosis can also result from reduced estrogen levels after menopause.  The most common causes of vaginosis are:   Bacterial vaginosis which results from an overgrowth of one on several organisms that are normally present in your vagina.   Yeast infections which are caused by a naturally occurring fungus called candida.   Vaginal atrophy (atrophic vaginosis) which results from the thinning of the vagina from reduced estrogen levels after menopause.   Trichomoniasis which is caused by a parasite and is commonly transmitted by sexual intercourse.  Factors that increase your risk of developing vaginosis include: Marland Kitchen Medications, such as antibiotics and steroids . Uncontrolled diabetes . Use of hygiene products such as bubble bath, vaginal spray or vaginal deodorant . Douching . Wearing damp or tight-fitting clothing . Using an intrauterine device (IUD) for birth control . Hormonal changes, such as those associated with pregnancy, birth control pills or menopause . Sexual activity . Having a sexually transmitted infection  Your treatment plan is (see above)  Be sure to take all of the  medication as directed. Stop taking any medication if you develop a rash, tongue swelling or shortness of breath. Mothers who are breast feeding should consider pumping and discarding their breast milk while on these antibiotics. However, there is no consensus that infant exposure at these doses would be harmful.  Remember that medication creams can weaken latex condoms. Marland Kitchen   HOME CARE:  Good hygiene may prevent some types of vaginosis from recurring and may relieve some symptoms:  . Avoid baths, hot tubs and whirlpool spas. Rinse soap from your outer genital area after a shower, and dry the area well to prevent irritation. Don't use scented or harsh soaps, such as those with deodorant or antibacterial action. Marland Kitchen Avoid irritants. These include scented tampons and pads. . Wipe from front to back after using the toilet. Doing so avoids spreading fecal bacteria to your vagina.  Other things that may help prevent vaginosis include:  Marland Kitchen Don't douche. Your vagina doesn't require cleansing other than normal bathing. Repetitive douching disrupts the normal organisms that reside in the vagina and can actually increase your risk of vaginal infection. Douching won't clear up a vaginal infection. . Use a latex condom. Both female and female latex condoms may help you avoid infections spread by sexual contact. . Wear cotton underwear. Also wear pantyhose with a cotton crotch. If you feel comfortable without it, skip wearing underwear to bed. Yeast thrives in [employer] Your symptoms should improve in the next day or two.  GET HELP RIGHT AWAY IF:  . You have pain in your lower  abdomen ( pelvic area or over your ovaries) . You develop nausea or vomiting . You develop a fever . Your discharge changes or worsens . You have persistent pain with intercourse . You develop shortness of breath, a rapid pulse, or you faint.  These symptoms could be signs of problems or infections that need to be evaluated  by a medical provider now.  MAKE SURE YOU    Understand these instructions.  Will watch your condition.  Will get help right away if you are not doing well or get worse.  Your e-visit answers were reviewed by a board certified advanced clinical practitioner to complete your personal care plan. Depending upon the condition, your plan could have included both over the counter or prescription medications. Please review your pharmacy choice to make sure that you have choses a pharmacy that is open for you to pick up any needed prescription, Your safety is important to [place]. If you have drug allergies check your prescription carefully.   You can use MyChart to ask questions about today's visit, request a non-urgent call back, or ask for a work or school excuse for 24 hours related to this e-Visit. If it has been greater than 24 hours you will need to follow up with your provider, or enter a new e-Visit to address those concerns. You will get a MyChart message within the next two days asking about your experience. I hope that your e-visit has been valuable and will speed your recovery.

## 2018-06-28 ENCOUNTER — Telehealth: Payer: 59 | Admitting: Nurse Practitioner

## 2018-06-28 DIAGNOSIS — N3000 Acute cystitis without hematuria: Secondary | ICD-10-CM

## 2018-06-28 NOTE — Progress Notes (Signed)
Based on what you shared with me it looks like you have UTI but possible pyelonephritis due to accompanying back pain.,that should be evaluated in a face to face office visit. You will need to be seen for urinalysis and urine culture.   NOTE: If you entered your credit card information for this eVisit, you will not be charged. You may see a "hold" on your card for the $30 but that hold will drop off and you will not have a charge processed.  If you are having a true medical emergency please call 911.  If you need an urgent face to face visit, [place] has four urgent care centers for your convenience.  If you need care fast and have a high deductible or no insurance consider:   [email or web address] to reserve your spot online an avoid wait times  [facility] [address], [address] [place], [place] [address] 8 am to 8 pm Monday-Friday 10 am to 4 pm Saturday-Sunday *Across the street from [employer]  [address] [place], [address] 8 am to 5 pm Monday-Friday * In the [facility] on the [facility]   The following sites will take your  insurance:  . [facility]  [phone] Get Driving Directions Find a Provider at this Location  [address] [place], [place] [address] . 10 am to 8 pm Monday-Friday . 12 pm to 8 pm Saturday-Sunday   . [facility] at [facility]  [phone] Get Driving Directions Find a Provider at this Location  [address], [address] [place], [place] [address] . 8 am to 8 pm Monday-Friday . 9 am to 6 pm Saturday . 11 am to 6 pm Sunday   . [facility] at [facility]  [phone] Get Driving Directions  [address] [place], [place] [address] . 8 am to 8 pm Monday-Friday . 8 am to 4 pm Saturday-Sunday   Your e-visit answers were reviewed by a board certified advanced clinical practitioner to complete your  personal care plan.  5 minutes spent reviewing and documenting in chart.

## 2018-07-21 DIAGNOSIS — N764 Abscess of vulva: Secondary | ICD-10-CM | POA: Diagnosis not present

## 2018-07-21 DIAGNOSIS — N760 Acute vaginitis: Secondary | ICD-10-CM | POA: Diagnosis not present

## 2018-07-21 DIAGNOSIS — N6320 Unspecified lump in the left breast, unspecified quadrant: Secondary | ICD-10-CM | POA: Diagnosis not present

## 2018-09-20 MED FILL — VENLAFAXINE HCL ER 150 MG C: 150 | 90 days supply | Qty: 90 | Fill #0

## 2018-09-28 DIAGNOSIS — L700 Acne vulgaris: Secondary | ICD-10-CM | POA: Diagnosis not present

## 2018-09-28 DIAGNOSIS — L858 Other specified epidermal thickening: Secondary | ICD-10-CM | POA: Diagnosis not present

## 2018-09-28 DIAGNOSIS — L821 Other seborrheic keratosis: Secondary | ICD-10-CM | POA: Diagnosis not present

## 2018-10-11 DIAGNOSIS — Z01419 Encounter for gynecological examination (general) (routine) without abnormal findings: Secondary | ICD-10-CM | POA: Diagnosis not present

## 2018-10-11 DIAGNOSIS — Z6833 Body mass index (BMI) 33.0-33.9, adult: Secondary | ICD-10-CM | POA: Diagnosis not present

## 2018-10-11 DIAGNOSIS — N761 Subacute and chronic vaginitis: Secondary | ICD-10-CM | POA: Diagnosis not present

## 2018-10-23 MED FILL — VENLAFAXINE HCL ER 150 MG C: 150 | 90 days supply | Qty: 90 | Fill #0

## 2018-12-28 ENCOUNTER — Telehealth (INDEPENDENT_AMBULATORY_CARE_PROVIDER_SITE_OTHER): Payer: Self-pay | Admitting: Physician Assistant

## 2018-12-28 DIAGNOSIS — N760 Acute vaginitis: Secondary | ICD-10-CM

## 2018-12-28 MED ORDER — CLINDAMYCIN PHOSPHATE 2 % VA CREA: 1.0000 | TOPICAL_CREAM | Freq: Every day | VAGINAL | 0 refills | Status: DC

## 2018-12-28 MED FILL — CLINDAMYCIN 2% VAGINAL CRM: 2 | 8 days supply | Qty: 40 | Fill #0

## 2018-12-28 NOTE — Progress Notes (Signed)
We are sorry that you are not feeling well. Here is how we plan to help! Based on what you shared with me it looks like you: May have a vaginosis due to bacteria  Vaginosis is an inflammation of the vagina that can result in discharge, itching and pain. The cause is usually a change in the normal balance of vaginal bacteria or an infection. Vaginosis can also result from reduced estrogen levels after menopause.  The most common causes of vaginosis are:   Bacterial vaginosis which results from an overgrowth of one on several organisms that are normally present in your vagina.   Yeast infections which are caused by a naturally occurring fungus called candida.   Vaginal atrophy (atrophic vaginosis) which results from the thinning of the vagina from reduced estrogen levels after menopause.   Trichomoniasis which is caused by a parasite and is commonly transmitted by sexual intercourse.  Factors that increase your risk of developing vaginosis include: Marland Kitchen Medications, such as antibiotics and steroids . Uncontrolled diabetes . Use of hygiene products such as bubble bath, vaginal spray or vaginal deodorant . Douching . Wearing damp or tight-fitting clothing . Using an intrauterine device (IUD) for birth control . Hormonal changes, such as those associated with pregnancy, birth control pills or menopause . Sexual activity . Having a sexually transmitted infection  Your treatment plan is Clindamycin vaginal cream 5 grams applied vaginally for 7 days.  I have electronically sent this prescription into the pharmacy that you have chosen.  Be sure to take all of the medication as directed. Stop taking any medication if you develop a rash, tongue swelling or shortness of breath. Mothers who are breast feeding should consider pumping and discarding their breast milk while on these antibiotics. However, there is no consensus that infant exposure at these doses would be harmful.  Remember that medication  creams can weaken latex condoms. Marland Kitchen   HOME CARE:  Good hygiene may prevent some types of vaginosis from recurring and may relieve some symptoms:  . Avoid baths, hot tubs and whirlpool spas. Rinse soap from your outer genital area after a shower, and dry the area well to prevent irritation. Don't use scented or harsh soaps, such as those with deodorant or antibacterial action. Marland Kitchen Avoid irritants. These include scented tampons and pads. . Wipe from front to back after using the toilet. Doing so avoids spreading fecal bacteria to your vagina.  Other things that may help prevent vaginosis include:  Marland Kitchen Don't douche. Your vagina doesn't require cleansing other than normal bathing. Repetitive douching disrupts the normal organisms that reside in the vagina and can actually increase your risk of vaginal infection. Douching won't clear up a vaginal infection. . Use a latex condom. Both female and female latex condoms may help you avoid infections spread by sexual contact. . Wear cotton underwear. Also wear pantyhose with a cotton crotch. If you feel comfortable without it, skip wearing underwear to bed. Yeast thrives in [employer] Your symptoms should improve in the next day or two.  We cannot rule out a sexually transmitted infection if you are sexually active. Therefore make sure to use protection during intercourse and please see a provider for STD testing if symptoms do not improve with your prescription.  GET HELP RIGHT AWAY IF:  . You have pain in your lower abdomen ( pelvic area or over your ovaries) . You develop nausea or vomiting . You develop a fever . Your discharge changes or worsens . You  have persistent pain with intercourse . You develop shortness of breath, a rapid pulse, or you faint.  These symptoms could be signs of problems or infections that need to be evaluated by a medical provider now.  MAKE SURE YOU    Understand these instructions.  Will watch your  condition.  Will get help right away if you are not doing well or get worse.   Your e-visit answers were reviewed by a board certified advanced clinical practitioner to complete your personal care plan. Depending upon the condition, your plan could have included both over the counter or prescription medications. Please review your pharmacy choice to make sure that you have choses a pharmacy that is open for you to pick up any needed prescription, Your safety is important to us. If you have drug allergies check your prescription carefully.  You can use MyChart to ask questions about today's visit, request a non-urgent call back, or ask for a work or school excuse. You will get a MyChart message within the next two days asking about your experience. I hope that your e-visit has been valuable and will speed your recovery.   I needed about 5 minutes for this patient

## 2019-01-08 MED FILL — CLINDAMYCIN 2% VAGINAL CRM: 2 | 8 days supply | Qty: 40 | Fill #0

## 2019-03-05 ENCOUNTER — Telehealth: Payer: 59 | Admitting: Physician Assistant

## 2019-03-05 DIAGNOSIS — N898 Other specified noninflammatory disorders of vagina: Secondary | ICD-10-CM

## 2019-03-05 MED ORDER — CLINDAMYCIN PHOSPHATE 2 % VA CREA: 1.0000 | TOPICAL_CREAM | Freq: Every day | VAGINAL | 0 refills | Status: AC

## 2019-03-05 MED FILL — CLINDAMYCIN 2% VAGINAL CRM: 2 | 7 days supply | Qty: 40 | Fill #0

## 2019-03-05 MED FILL — VENLAFAXINE HCL ER 150 MG C: 150 | 90 days supply | Qty: 90 | Fill #1

## 2019-03-05 NOTE — Progress Notes (Signed)

## 2019-04-24 ENCOUNTER — Telehealth: Payer: 59 | Admitting: Family

## 2019-04-24 DIAGNOSIS — B9789 Other viral agents as the cause of diseases classified elsewhere: Secondary | ICD-10-CM

## 2019-04-24 DIAGNOSIS — J019 Acute sinusitis, unspecified: Secondary | ICD-10-CM | POA: Diagnosis not present

## 2019-04-24 MED ORDER — FLUTICASONE PROPIONATE 50 MCG/ACT NA SUSP: 2.0000 | Freq: Every day | NASAL | 6 refills | Status: DC

## 2019-04-24 NOTE — Progress Notes (Signed)
We are sorry that you are not feeling well.  Here is how we plan to help!  Based on what you have shared with me it looks like you have sinusitis.  Sinusitis is inflammation and infection in the sinus cavities of the head.  Based on your presentation I believe you most likely have Acute Viral Sinusitis.This is an infection most likely caused by a virus. There is not specific treatment for viral sinusitis other than to help you with the symptoms until the infection runs its course.  You may use an oral decongestant such as Mucinex D or if you have glaucoma or high blood pressure use plain Mucinex. Saline nasal spray help and can safely be used as often as needed for congestion, I have prescribed: Fluticasone nasal spray two sprays in each nostril once a day  Some authorities believe that zinc sprays or the use of Echinacea may shorten the course of your symptoms.  Sinus infections are not as easily transmitted as other respiratory infection, however we still recommend that you avoid close contact with loved ones, especially the very young and elderly.  Remember to wash your hands thoroughly throughout the day as this is the number one way to prevent the spread of infection!  Home Care:  Only take medications as instructed by your medical team.  Do not take these medications with alcohol.  A steam or ultrasonic humidifier can help congestion.  You can place a towel over your head and breathe in the steam from hot water coming from a faucet.  Avoid close contacts especially the very young and the elderly.  Cover your mouth when you cough or sneeze.  Always remember to wash your hands.  Get Help Right Away If:  You develop worsening fever or sinus pain.  You develop a severe head ache or visual changes.  Your symptoms persist after you have completed your treatment plan.  Make sure you  Understand these instructions.  Will watch your condition.  Will get help right away if you are not  doing well or get worse.  Your e-visit answers were reviewed by a board certified advanced clinical practitioner to complete your personal care plan.  Depending on the condition, your plan could have included both over the counter or prescription medications.  If there is a problem please reply  once you have received a response from your provider.  Your safety is important to us.  If you have drug allergies check your prescription carefully.    You can use MyChart to ask questions about today's visit, request a non-urgent call back, or ask for a work or school excuse for 24 hours related to this e-Visit. If it has been greater than 24 hours you will need to follow up with your provider, or enter a new e-Visit to address those concerns.  You will get an e-mail in the next two days asking about your experience.  I hope that your e-visit has been valuable and will speed your recovery. Thank you for using e-visits.  Greater than 5 minutes, yet less than 10 minutes of time have been spent researching, coordinating, and implementing care for this patient today.  Thank you for the details you included in the comment boxes. Those details are very helpful in determining the best course of treatment for you and help us to provide the best care.  

## 2019-04-29 ENCOUNTER — Telehealth: Payer: 59 | Admitting: Family

## 2019-04-29 DIAGNOSIS — N898 Other specified noninflammatory disorders of vagina: Secondary | ICD-10-CM

## 2019-04-29 NOTE — Progress Notes (Signed)
Based on what you shared with me, I feel your condition warrants further evaluation and I recommend that you be seen for a face to face office visit.  Given this is your third Evisit for vagina symptoms within the last 4 months, you need to be seen face to face to rule out other serious illness.    NOTE: If you entered your credit card information for this eVisit, you will not be charged. You may see a "hold" on your card for the $35 but that hold will drop off and you will not have a charge processed.   If you are having a true medical emergency please call 911.      For an urgent face to face visit, [place] has five urgent care centers for your convenience:      NEW:  [facility] at [facility] Directions [phone] [address] [place], [place] [address] . 10 am - 6pm Monday - Friday    [facility]) Get Driving Directions [phone] [address] [place], [place] [address] . 10 am to 8 pm Monday-Friday . 12 pm to 8 pm [facility] at [facility] Get Driving Directions [phone] [address] [place], [place] [address] . 8 am to 8 pm Monday-Friday . 9 am to 6 pm Saturday . 11 am to 6 pm Sunday     [facility] at [facility] Get Driving Directions  [phone] [address] [place], [place] [address] . 8 am to 8 pm Monday-Friday . 8 am to 4 pm [facility] Urgent Care at [facility] Directions [phone] [address] [place], [place] [address] . 12 pm to 6 pm Monday-Friday      Your e-visit answers were reviewed by a board certified advanced clinical practitioner to complete your personal care plan.  Thank you for using e-Visits.

## 2019-05-03 DIAGNOSIS — N760 Acute vaginitis: Secondary | ICD-10-CM | POA: Diagnosis not present

## 2019-05-03 DIAGNOSIS — B9689 Other specified bacterial agents as the cause of diseases classified elsewhere: Secondary | ICD-10-CM | POA: Diagnosis not present

## 2019-08-03 MED FILL — VENLAFAXINE HCL ER 150 MG C: 150 | 90 days supply | Qty: 90 | Fill #0

## 2019-08-19 DIAGNOSIS — Z20822 Contact with and (suspected) exposure to covid-19: Secondary | ICD-10-CM | POA: Diagnosis not present

## 2019-08-19 DIAGNOSIS — J020 Streptococcal pharyngitis: Secondary | ICD-10-CM | POA: Diagnosis not present

## 2019-08-27 ENCOUNTER — Telehealth: Payer: 59 | Admitting: Emergency Medicine

## 2019-08-27 DIAGNOSIS — R300 Dysuria: Secondary | ICD-10-CM

## 2019-08-27 MED ORDER — NITROFURANTOIN MONOHYD MACRO 100 MG PO CAPS: 100.0000 mg | ORAL_CAPSULE | Freq: Two times a day (BID) | ORAL | 0 refills | Status: DC

## 2019-08-27 NOTE — Progress Notes (Signed)
We are sorry that you are not feeling well.  Here is how we plan to help!  Based on what you shared with me it looks like you most likely have a simple urinary tract infection.  A UTI (Urinary Tract Infection) is a bacterial infection of the bladder.  Most cases of urinary tract infections are simple to treat but a key part of your care is to encourage you to drink plenty of fluids and watch your symptoms carefully.  I have prescribed MacroBid 100 mg twice a day for 5 days.  Your symptoms should gradually improve. Call us if the burning in your urine worsens, you develop worsening fever, back pain or pelvic pain or if your symptoms do not resolve after completing the antibiotic.  Urinary tract infections can be prevented by drinking plenty of water to keep your body hydrated.  Also be sure when you wipe, wipe from front to back and don't hold it in!  If possible, empty your bladder every 4 hours.  Your e-visit answers were reviewed by a board certified advanced clinical practitioner to complete your personal care plan.  Depending on the condition, your plan could have included both over the counter or prescription medications.  If there is a problem please reply  once you have received a response from your provider.  Your safety is important to us.  If you have drug allergies check your prescription carefully.    You can use MyChart to ask questions about today's visit, request a non-urgent call back, or ask for a work or school excuse for 24 hours related to this e-Visit. If it has been greater than 24 hours you will need to follow up with your provider, or enter a new e-Visit to address those concerns.   You will get an e-mail in the next two days asking about your experience.  I hope that your e-visit has been valuable and will speed your recovery. Thank you for using e-visits.   Approximately 5 minutes was used in reviewing the patient's chart, questionnaire, prescribing medications, and  documentation.  

## 2019-12-17 ENCOUNTER — Telehealth: Payer: 59 | Admitting: Emergency Medicine

## 2019-12-17 DIAGNOSIS — R300 Dysuria: Secondary | ICD-10-CM | POA: Diagnosis not present

## 2019-12-17 MED ORDER — NITROFURANTOIN MONOHYD MACRO 100 MG PO CAPS: 100.0000 mg | ORAL_CAPSULE | Freq: Two times a day (BID) | ORAL | 0 refills | Status: DC

## 2019-12-17 MED FILL — NITROFURANTOIN MONO-MCR 100: 100 | 5 days supply | Qty: 10 | Fill #0

## 2019-12-17 NOTE — Progress Notes (Signed)
We are sorry that you are not feeling well.  Here is how we plan to help!  Based on what you shared with me it looks like you most likely have a simple urinary tract infection.  A UTI (Urinary Tract Infection) is a bacterial infection of the bladder.  Most cases of urinary tract infections are simple to treat but a key part of your care is to encourage you to drink plenty of fluids and watch your symptoms carefully.  I have prescribed MacroBid 100 mg twice a day for 5 days.  Your symptoms should gradually improve. Call us if the burning in your urine worsens, you develop worsening fever, back pain or pelvic pain or if your symptoms do not resolve after completing the antibiotic.  Urinary tract infections can be prevented by drinking plenty of water to keep your body hydrated.  Also be sure when you wipe, wipe from front to back and don't hold it in!  If possible, empty your bladder every 4 hours.  Your e-visit answers were reviewed by a board certified advanced clinical practitioner to complete your personal care plan.  Depending on the condition, your plan could have included both over the counter or prescription medications.  If there is a problem please reply  once you have received a response from your provider.  Your safety is important to us.  If you have drug allergies check your prescription carefully.    You can use MyChart to ask questions about today's visit, request a non-urgent call back, or ask for a work or school excuse for 24 hours related to this e-Visit. If it has been greater than 24 hours you will need to follow up with your provider, or enter a new e-Visit to address those concerns.   You will get an e-mail in the next two days asking about your experience.  I hope that your e-visit has been valuable and will speed your recovery. Thank you for using e-visits.   **Please do not respond to this message unless you have follow up questions.** Greater than 5 but less than 10  minutes spent researching, coordinating, and implementing care for this patient today  

## 2019-12-28 ENCOUNTER — Other Ambulatory Visit (HOSPITAL_COMMUNITY): Payer: Self-pay | Admitting: Nurse Practitioner

## 2019-12-28 ENCOUNTER — Telehealth: Payer: 59 | Admitting: Physician Assistant

## 2019-12-28 DIAGNOSIS — N760 Acute vaginitis: Secondary | ICD-10-CM

## 2019-12-28 DIAGNOSIS — Z8616 Personal history of COVID-19: Secondary | ICD-10-CM | POA: Diagnosis not present

## 2019-12-28 DIAGNOSIS — Z6833 Body mass index (BMI) 33.0-33.9, adult: Secondary | ICD-10-CM | POA: Diagnosis not present

## 2019-12-28 DIAGNOSIS — F419 Anxiety disorder, unspecified: Secondary | ICD-10-CM | POA: Diagnosis not present

## 2019-12-28 MED ORDER — METRONIDAZOLE 500 MG PO TABS: 500.0000 mg | ORAL_TABLET | Freq: Two times a day (BID) | ORAL | 0 refills | Status: DC

## 2019-12-28 MED FILL — VENLAFAXINE HCL ER 150 MG C: 150 | 90 days supply | Qty: 90 | Fill #0

## 2019-12-28 MED FILL — METRONIDAZOLE 500 MG TABS: 500 | 7 days supply | Qty: 14 | Fill #0

## 2019-12-28 NOTE — Progress Notes (Signed)
We are sorry that you are not feeling well. Here is how we plan to help! Based on what you shared with me it looks like you: May have a vaginosis due to bacteria  Vaginosis is an inflammation of the vagina that can result in discharge, itching and pain. The cause is usually a change in the normal balance of vaginal bacteria or an infection. Vaginosis can also result from reduced estrogen levels after menopause.  The most common causes of vaginosis are:   Bacterial vaginosis which results from an overgrowth of one on several organisms that are normally present in your vagina.   Yeast infections which are caused by a naturally occurring fungus called candida.   Vaginal atrophy (atrophic vaginosis) which results from the thinning of the vagina from reduced estrogen levels after menopause.   Trichomoniasis which is caused by a parasite and is commonly transmitted by sexual intercourse.  Factors that increase your risk of developing vaginosis include: Marland Kitchen Medications, such as antibiotics and steroids . Uncontrolled diabetes . Use of hygiene products such as bubble bath, vaginal spray or vaginal deodorant . Douching . Wearing damp or tight-fitting clothing . Using an intrauterine device (IUD) for birth control . Hormonal changes, such as those associated with pregnancy, birth control pills or menopause . Sexual activity . Having a sexually transmitted infection  Your treatment plan is Metronidazole or Flagyl 500mg  twice a day for 7 days.  I have electronically sent this prescription into the pharmacy that you have chosen.  DO NOT DRINK while taking this medication.  Be sure to take all of the medication as directed. Stop taking any medication if you develop a rash, tongue swelling or shortness of breath. Mothers who are breast feeding should consider pumping and discarding their breast milk while on these antibiotics. However, there is no consensus that infant exposure at these doses would be  harmful.  Remember that medication creams can weaken latex condoms.   HOME CARE:  Good hygiene may prevent some types of vaginosis from recurring and may relieve some symptoms:  . Avoid baths, hot tubs and whirlpool spas. Rinse soap from your outer genital area after a shower, and dry the area well to prevent irritation. Don't use scented or harsh soaps, such as those with deodorant or antibacterial action. Marland Kitchen Avoid irritants. These include scented tampons and pads. . Wipe from front to back after using the toilet. Doing so avoids spreading fecal bacteria to your vagina.  Other things that may help prevent vaginosis include:  Marland Kitchen Don't douche. Your vagina doesn't require cleansing other than normal bathing. Repetitive douching disrupts the normal organisms that reside in the vagina and can actually increase your risk of vaginal infection. Douching won't clear up a vaginal infection. . Use a latex condom. Both female and female latex condoms may help you avoid infections spread by sexual contact. . Wear cotton underwear. Also wear pantyhose with a cotton crotch. If you feel comfortable without it, skip wearing underwear to bed. Yeast thrives in Marland Kitchen Your symptoms should improve in the next day or two.  GET HELP RIGHT AWAY IF:  . You have pain in your lower abdomen ( pelvic area or over your ovaries) . You develop nausea or vomiting . You develop a fever . Your discharge changes or worsens . You have persistent pain with intercourse . You develop shortness of breath, a rapid pulse, or you faint.  These symptoms could be signs of problems or infections that need to  be evaluated by a medical provider now.  MAKE SURE YOU    Understand these instructions.  Will watch your condition.  Will get help right away if you are not doing well or get worse.  Your e-visit answers were reviewed by a board certified advanced clinical practitioner to complete your personal care plan.  Depending upon the condition, your plan could have included both over the counter or prescription medications. Please review your pharmacy choice to make sure that you have choses a pharmacy that is open for you to pick up any needed prescription, Your safety is important to [place]. If you have drug allergies check your prescription carefully.   You can use MyChart to ask questions about today's visit, request a non-urgent call back, or ask for a work or school excuse for 24 hours related to this e-Visit. If it has been greater than 24 hours you will need to follow up with your provider, or enter a new e-Visit to address those concerns. You will get a MyChart message within the next two days asking about your experience. I hope that your e-visit has been valuable and will speed your recovery.  Greater than 5 minutes, yet less than 10 minutes of time have been spent researching, coordinating, and implementing care for this patient today

## 2020-01-01 ENCOUNTER — Telehealth: Payer: 59 | Admitting: Physician Assistant

## 2020-01-01 DIAGNOSIS — H1030 Unspecified acute conjunctivitis, unspecified eye: Secondary | ICD-10-CM

## 2020-01-01 MED ORDER — OFLOXACIN 0.3 % OP SOLN: 1.0000 [drp] | Freq: Four times a day (QID) | OPHTHALMIC | 0 refills | Status: DC

## 2020-01-01 MED FILL — OFLOXACIN 0.3% EYE DROPS: 0.3 | 25 days supply | Qty: 5 | Fill #0

## 2020-01-01 NOTE — Progress Notes (Signed)
E-Visit for Pink Eye   We are sorry that you are not feeling well.  Here is how we plan to help!  Based on what you have shared with me it looks like you have conjunctivitis.  Conjunctivitis is a common inflammatory or infectious condition of the eye that is often referred to as "pink eye".  In most cases it is contagious (viral or bacterial). However, not all conjunctivitis requires antibiotics (ex. Allergic).  We have made appropriate suggestions for you based upon your presentation.  I have prescribed Oflaxacin 1-2 drops 4 times a day times 5 days   Pink eye can be highly contagious.  It is typically spread through direct contact with secretions, or contaminated objects or surfaces that one may have touched.  Strict handwashing is suggested with soap and water is urged.  If not available, use alcohol based had sanitizer.  Avoid unnecessary touching of the eye.  If you wear contact lenses, you will need to refrain from wearing them until you see no white discharge from the eye for at least 24 hours after being on medication.  You should see symptom improvement in 1-2 days after starting the medication regimen.  Call us if symptoms are not improved in 1-2 days.  Home Care:  Wash your hands often!  Do not wear your contacts until you complete your treatment plan.  Avoid sharing towels, bed linen, personal items with a person who has pink eye.  See attention for anyone in your home with similar symptoms.  Get Help Right Away If:  Your symptoms do not improve.  You develop blurred or loss of vision.  Your symptoms worsen (increased discharge, pain or redness)  Your e-visit answers were reviewed by a board certified advanced clinical practitioner to complete your personal care plan.  Depending on the condition, your plan could have included both over the counter or prescription medications.  If there is a problem please reply  once you have received a response from your provider.  Your  safety is important to us.  If you have drug allergies check your prescription carefully.    You can use MyChart to ask questions about today's visit, request a non-urgent call back, or ask for a work or school excuse for 24 hours related to this e-Visit. If it has been greater than 24 hours you will need to follow up with your provider, or enter a new e-Visit to address those concerns.   You will get an e-mail in the next two days asking about your experience.  I hope that your e-visit has been valuable and will speed your recovery. Thank you for using e-visits.   Approximately 5 minutes was spent documenting and reviewing patient's chart.    

## 2020-01-03 ENCOUNTER — Telehealth: Payer: 59 | Admitting: Physician Assistant

## 2020-01-03 DIAGNOSIS — H5789 Other specified disorders of eye and adnexa: Secondary | ICD-10-CM

## 2020-01-03 NOTE — Progress Notes (Signed)
Based on what you shared with me, I feel your condition warrants further evaluation and I recommend that you be seen for a face to face office visit.  If you haven't seen any improvement since your recent virtual visit, I definitely recommend face to face evaluation to better understand the etiology of your eye symptoms. You could be experiencing something more serious, but that can't be identified without an in person physical examination.     NOTE: If you entered your credit card information for this eVisit, you will not be charged. You may see a "hold" on your card for the $35 but that hold will drop off and you will not have a charge processed.   If you are having a true medical emergency please call 911.      For an urgent face to face visit, [facility] has five urgent care centers for your convenience:      NEW:  [facility] at [facility] Directions [phone] [address] [place], [place] [address] . 10 am - 6pm Monday - Friday    [facility]) Get Driving Directions [phone] [address] [place], [place] [address] . 10 am to 8 pm Monday-Friday . 12 pm to 8 pm [facility] Urgent Care at [facility] Get Driving Directions [phone] [address] [place], [place] [address] . 8 am to 8 pm Monday-Friday . 9 am to 6 pm Saturday . 11 am to 6 pm Sunday     [facility] at [facility] Get Driving Directions  [phone] [address] [place], [place] [address] . 8 am to 8 pm Monday-Friday . 8 am to 4 pm [facility] Urgent Care at [facility] Directions [phone] [address] [place], [place] [address] . 12 pm to 6 pm Monday-Friday      Your e-visit answers were reviewed by a board certified advanced clinical practitioner to complete your personal care plan.  Thank you  for using e-Visits.   Greater than 5 minutes, yet less than 10 minutes of time have been spent researching, coordinating, and implementing care for this patient today.

## 2020-01-08 ENCOUNTER — Telehealth: Payer: 59 | Admitting: Physician Assistant

## 2020-01-08 DIAGNOSIS — H18892 Other specified disorders of cornea, left eye: Secondary | ICD-10-CM

## 2020-01-08 MED ORDER — OFLOXACIN 0.3 % OP SOLN: 1.0000 [drp] | Freq: Four times a day (QID) | OPHTHALMIC | 0 refills | Status: AC

## 2020-01-08 MED FILL — OFLOXACIN 0.3% EYE DROPS: 0.3 | 25 days supply | Qty: 5 | Fill #0

## 2020-01-08 NOTE — Progress Notes (Signed)
E-Visit for [employer]   We are sorry that you are not feeling well.  Here is how we plan to help!  Based on what you have shared with me it looks like you have conjunctivitis.  Conjunctivitis is a common inflammatory or infectious condition of the eye that is often referred to as "pink eye".  In most cases it is contagious (viral or bacterial). However, not all conjunctivitis requires antibiotics (ex. Allergic).  We have made appropriate suggestions for you based upon your presentation.  I have prescribed Oflaxacin 1-2 drops 4 times a day times 5 days   Pink eye can be highly contagious.  It is typically spread through direct contact with secretions, or contaminated objects or surfaces that one may have touched.  Strict handwashing is suggested with soap and water is urged.  If not available, use alcohol based had sanitizer.  Avoid unnecessary touching of the eye.  If you wear contact lenses, you will need to refrain from wearing them until you see no white discharge from the eye for at least 24 hours after being on medication.  You should see symptom improvement in 1-2 days after starting the medication regimen.  Call us if symptoms are not improved in 1-2 days.  Home Care:  Wash your hands often!  Do not wear your contacts until you complete your treatment plan.  Avoid sharing towels, bed linen, personal items with a person who has pink eye.  See attention for anyone in your home with similar symptoms.  Get Help Right Away If:  Your symptoms do not improve.  You develop blurred or loss of vision.  Your symptoms worsen (increased discharge, pain or redness)  Your e-visit answers were reviewed by a board certified advanced clinical practitioner to complete your personal care plan.  Depending on the condition, your plan could have included both over the counter or prescription medications.  If there is a problem please reply  once you have received a response from your provider.  Your  safety is important to [place].  If you have drug allergies check your prescription carefully.    You can use MyChart to ask questions about today's visit, request a non-urgent call back, or ask for a work or school excuse for 24 hours related to this e-Visit. If it has been greater than 24 hours you will need to follow up with your provider, or enter a new e-Visit to address those concerns.   You will get an e-mail in the next two days asking about your experience.  I hope that your e-visit has been valuable and will speed your recovery. Thank you for using e-visits.  5  Minutes spent charting

## 2020-01-15 ENCOUNTER — Other Ambulatory Visit (HOSPITAL_COMMUNITY): Payer: 59 | Admitting: Dentistry

## 2020-01-15 ENCOUNTER — Other Ambulatory Visit: Payer: Self-pay

## 2020-01-24 ENCOUNTER — Other Ambulatory Visit (HOSPITAL_COMMUNITY): Payer: Self-pay | Admitting: Nurse Practitioner

## 2020-01-24 DIAGNOSIS — R635 Abnormal weight gain: Secondary | ICD-10-CM | POA: Diagnosis not present

## 2020-01-24 DIAGNOSIS — R5383 Other fatigue: Secondary | ICD-10-CM | POA: Diagnosis not present

## 2020-01-24 DIAGNOSIS — Z713 Dietary counseling and surveillance: Secondary | ICD-10-CM | POA: Diagnosis not present

## 2020-01-24 DIAGNOSIS — Z1331 Encounter for screening for depression: Secondary | ICD-10-CM | POA: Diagnosis not present

## 2020-01-24 DIAGNOSIS — Z6833 Body mass index (BMI) 33.0-33.9, adult: Secondary | ICD-10-CM | POA: Diagnosis not present

## 2020-01-24 MED FILL — PHENTERMINE 37.5 MG TABLET: 37.5 | 30 days supply | Qty: 30 | Fill #0

## 2020-03-24 MED FILL — VENLAFAXINE HCL ER 150 MG C: 150 | 90 days supply | Qty: 90 | Fill #1

## 2020-04-22 ENCOUNTER — Other Ambulatory Visit: Payer: Self-pay | Admitting: Emergency Medicine

## 2020-04-22 ENCOUNTER — Telehealth: Payer: 59 | Admitting: Emergency Medicine

## 2020-04-22 DIAGNOSIS — N390 Urinary tract infection, site not specified: Secondary | ICD-10-CM

## 2020-04-22 MED ORDER — NITROFURANTOIN MONOHYD MACRO 100 MG PO CAPS: 100.0000 mg | ORAL_CAPSULE | Freq: Two times a day (BID) | ORAL | 0 refills | Status: DC

## 2020-04-22 MED FILL — NITROFURANTOIN MONO-MCR 100: 100 | 5 days supply | Qty: 10 | Fill #0

## 2020-04-22 NOTE — Progress Notes (Signed)
We are sorry that you are not feeling well.  Here is how we plan to help!  Based on what you shared with me it looks like you most likely have a simple urinary tract infection.  A UTI (Urinary Tract Infection) is a bacterial infection of the bladder.  Most cases of urinary tract infections are simple to treat but a key part of your care is to encourage you to drink plenty of fluids and watch your symptoms carefully.  I have prescribed MacroBid 100 mg twice a day for 5 days.  Your symptoms should gradually improve. Call us if the burning in your urine worsens, you develop worsening fever, back pain or pelvic pain or if your symptoms do not resolve after completing the antibiotic.  Urinary tract infections can be prevented by drinking plenty of water to keep your body hydrated.  Also be sure when you wipe, wipe from front to back and don't hold it in!  If possible, empty your bladder every 4 hours.  Your e-visit answers were reviewed by a board certified advanced clinical practitioner to complete your personal care plan.  Depending on the condition, your plan could have included both over the counter or prescription medications.  If there is a problem please reply  once you have received a response from your provider.  Your safety is important to us.  If you have drug allergies check your prescription carefully.    You can use MyChart to ask questions about today's visit, request a non-urgent call back, or ask for a work or school excuse for 24 hours related to this e-Visit. If it has been greater than 24 hours you will need to follow up with your provider, or enter a new e-Visit to address those concerns.   You will get an e-mail in the next two days asking about your experience.  I hope that your e-visit has been valuable and will speed your recovery. Thank you for using e-visits.   Approximately 5 minutes was used in reviewing the patient's chart, questionnaire, prescribing medications, and  documentation.  

## 2020-05-09 ENCOUNTER — Other Ambulatory Visit: Payer: Self-pay | Admitting: Physician Assistant

## 2020-05-09 ENCOUNTER — Telehealth: Payer: 59 | Admitting: Physician Assistant

## 2020-05-09 DIAGNOSIS — Z1152 Encounter for screening for COVID-19: Secondary | ICD-10-CM | POA: Diagnosis not present

## 2020-05-09 DIAGNOSIS — R059 Cough, unspecified: Secondary | ICD-10-CM

## 2020-05-09 MED ORDER — AZELASTINE HCL 0.1 % NA SOLN: 1.0000 | Freq: Two times a day (BID) | NASAL | 0 refills | Status: DC

## 2020-05-09 MED ORDER — BENZONATATE 100 MG PO CAPS: 100.0000 mg | ORAL_CAPSULE | Freq: Three times a day (TID) | ORAL | 0 refills | Status: DC | PRN

## 2020-05-09 MED ORDER — NAPROXEN 500 MG PO TABS: 500.0000 mg | ORAL_TABLET | Freq: Two times a day (BID) | ORAL | 0 refills | Status: DC

## 2020-05-09 MED FILL — AZELASTINE HCL 137 MCG/SPRA: 137 | 50 days supply | Qty: 30 | Fill #0

## 2020-05-09 MED FILL — NAPROXEN 500 MG TABS: 500 | 15 days supply | Qty: 30 | Fill #0

## 2020-05-09 MED FILL — BENZONATATE 100 MG CAPS: 100 | 7 days supply | Qty: 40 | Fill #0

## 2020-05-09 NOTE — Progress Notes (Signed)
E-Visit for Corona Virus Screening  Your current symptoms could be consistent with the coronavirus.  Many health care providers can now test patients at their office but not all are.  [facility] has multiple testing sites. For information on our COVID testing locations and hours go to [email or web address]  We are enrolling you in our MyChart Home Monitoring for COVID19 . Daily you will receive a questionnaire within the MyChart website. Our COVID 19 response team will be monitoring your responses daily.  Testing Information: The COVID-19 Community Testing sites are testing BY APPOINTMENT ONLY.  You can schedule online at [email or web address]  If you do not have access to a smart phone or computer you may call [phone] for an appointment.   Additional testing sites in the Community:  . For [facility] Testing sites in [facility]  [email or web address]  . For Pop-up testing sites in [place]  [email or web address]  . For [facility] [email or web address]  . For [facility] testing in [place] and [employer] [email or web address]  . For [facility] testing in [facility]   [email or web address]  For  more information about community testing call [phone]   Please quarantine yourself while awaiting your test results. Please stay home for a minimum of 10 days from the first day of illness with improving symptoms and you have had 24 hours of no fever (without the use of Tylenol (Acetaminophen) Motrin (Ibuprofen) or any fever reducing medication).  Also - Do not get tested prior to returning to work because once you have had a positive test the test can stay  positive for more than a month in some cases.   You should wear a mask or cloth face covering over your nose and mouth if you must be around other people or animals, including pets (even at home). Try to stay at least 6 feet away from other people. This will protect the people around you.  Please continue good preventive care measures, including:  frequent hand-washing, avoid touching your face, cover coughs/sneezes, stay out of crowds and keep a 6 foot distance from others.  COVID-19 is a respiratory illness with symptoms that are similar to the flu. Symptoms are typically mild to moderate, but there have been cases of severe illness and death due to the virus.   The following symptoms may appear 2-14 days after exposure: . Fever . Cough . Shortness of breath or difficulty breathing . Chills . Repeated shaking with chills . Muscle pain . Headache . Sore throat . New loss of taste or smell . Fatigue . Congestion or runny nose . Nausea or vomiting . Diarrhea  Go to the nearest hospital ED for assessment if fever/cough/breathlessness are severe or illness seems like a threat to life.  It is vitally important that if you feel that you have an infection such as this virus or any other virus that you stay home and away from places where you may spread it to others.  You should avoid contact with people age 84 and older.   You can use medication such as A prescription cough medication called Tessalon Perles 100 mg. You may take 1-2 capsules every 8 hours as needed for cough,  A prescription anti-inflammatory called Naprosyn 500 mg. Take twice daily as needed for fever or body aches for 2 weeks and A prescription for Azelastine nasal spray 2 sprays in each nostril twice per day  You may also take acetaminophen (Tylenol) as needed for fever.  Reduce your risk of any infection  by using the same precautions used for avoiding the common cold or flu:  Marland Kitchen Wash your hands often with soap and warm water  for at least 20 seconds.  If soap and water are not readily available, use an alcohol-based hand sanitizer with at least 60% alcohol.  . If coughing or sneezing, cover your mouth and nose by coughing or sneezing into the elbow areas of your shirt or coat, into a tissue or into your sleeve (not your hands). . Avoid shaking hands with others and consider head nods or verbal greetings only. . Avoid touching your eyes, nose, or mouth with unwashed hands.  . Avoid close contact with people who are sick. . Avoid places or events with large numbers of people in one location, like concerts or sporting events. . Carefully consider travel plans you have or are making. . If you are planning any travel outside or inside the [place], visit the CDC's Travelers' Health webpage for the latest health notices. . If you have some symptoms but not all symptoms, continue to monitor at home and seek medical attention if your symptoms worsen. . If you are having a medical emergency, call 911.  HOME CARE . Only take medications as instructed by your medical team. . Drink plenty of fluids and get plenty of rest. . A steam or ultrasonic humidifier can help if you have congestion.   GET HELP RIGHT AWAY IF YOU HAVE EMERGENCY WARNING SIGNS** FOR COVID-19. If you or someone is showing any of these signs seek emergency medical care immediately. Call 911 or proceed to your closest emergency facility if: . You develop worsening high fever. . Trouble breathing . Bluish lips or face . Persistent pain or pressure in the chest . New confusion . Inability to wake or stay awake . You cough up blood. . Your symptoms become more severe  **This list is not all possible symptoms. Contact your medical provider for any symptoms that are sever or concerning to you.  MAKE SURE YOU   Understand these instructions.  Will watch your condition.  Will get help right away if you are not doing well or get worse.  Your e-visit answers were  reviewed by a board certified advanced clinical practitioner to complete your personal care plan.  Depending on the condition, your plan could have included both over the counter or prescription medications.  If there is a problem please reply once you have received a response from your provider.  Your safety is important to [place].  If you have drug allergies check your prescription carefully.    You can use MyChart to ask questions about today's visit, request a non-urgent call back, or ask for a work or school excuse for 24 hours related to this e-Visit. If it has been greater than 24 hours you will need to follow up with your provider, or enter a new e-Visit to address those concerns. You will get an e-mail in the next two days asking about your experience.  I hope that your e-visit has been valuable and will speed your recovery. Thank you for using e-visits.   Greater than 5 minutes, yet less than 10 minutes of time have been spent researching, coordinating and implementing care for this patient today.

## 2020-05-10 ENCOUNTER — Telehealth: Payer: 59 | Admitting: Orthopedic Surgery

## 2020-05-10 ENCOUNTER — Encounter: Payer: Self-pay | Admitting: Physician Assistant

## 2020-05-10 DIAGNOSIS — U071 COVID-19: Secondary | ICD-10-CM

## 2020-05-10 MED ORDER — PREDNISONE 20 MG PO TABS: 40.0000 mg | ORAL_TABLET | Freq: Every day | ORAL | 0 refills | Status: AC

## 2020-05-10 NOTE — Progress Notes (Signed)
E-Visit for Corona Virus Screening  We are sorry you are not feeling well. We are here to help!  You have tested positive for COVID-19, meaning that you were infected with the novel coronavirus and could give the virus to others.  It is vitally important that you stay home so you do not spread it to others.      Please continue isolation at home, for at least 10 days since the start of your symptoms and until you have had 24 hours with no fever (without taking a fever reducer) and with improving of symptoms.  If you have no symptoms but tested positive (or all symptoms resolve after 5 days and you have no fever) you can leave your house but continue to wear a mask around others for an additional 5 days. If you have a fever,continue to stay home until you have had 24 hours of no fever. Most cases improve 5-10 days from onset but we have seen a small number of patients who have gotten worse after the 10 days.  Please be sure to watch for worsening symptoms and remain taking the proper precautions.   Go to the nearest hospital ED for assessment if fever/cough/breathlessness are severe or illness seems like a threat to life.    The following symptoms may appear 2-14 days after exposure: . Fever . Cough . Shortness of breath or difficulty breathing . Chills . Repeated shaking with chills . Muscle pain . Headache . Sore throat . New loss of taste or smell . Fatigue . Congestion or runny nose . Nausea or vomiting . Diarrhea  You have been enrolled in [facility] Monitoring for COVID-19. Daily you will receive a questionnaire within the MyChart website. Our COVID-19 response team will be monitoring your responses daily.   I will also prescribe a prednisone burst. This will help if you happen to have a sinus infection as well although this is highly unlikely. 40mg  daily for 5 days.     You may also take acetaminophen (Tylenol) as needed for fever.  HOME CARE: . Only take medications as  instructed by your medical team. . Drink plenty of fluids and get plenty of rest. . A steam or ultrasonic humidifier can help if you have congestion.   GET HELP RIGHT AWAY IF YOU HAVE EMERGENCY WARNING SIGNS.  Call 911 or proceed to your closest emergency facility if: . You develop worsening high fever. . Trouble breathing . Bluish lips or face . Persistent pain or pressure in the chest . New confusion . Inability to wake or stay awake . You cough up blood. . Your symptoms become more severe . Inability to hold down food or fluids  This list is not all possible symptoms. Contact your medical provider for any symptoms that are severe or concerning to you.    Your e-visit answers were reviewed by a board certified advanced clinical practitioner to complete your personal care plan.  Depending on the condition, your plan could have included both over the counter or prescription medications.  If there is a problem please reply once you have received a response from your provider.  Your safety is important to .  If you have drug allergies check your prescription carefully.    You can use MyChart to ask questions about today's visit, request a non-urgent call back, or ask for a work or school excuse for 24 hours related to this e-Visit. If it has been greater than 24 hours you will need  to follow up with your provider, or enter a new e-Visit to address those concerns. You will get an e-mail in the next two days asking about your experience.  I hope that your e-visit has been valuable and will speed your recovery. Thank you for using e-visits.   Greater than 5 minutes, yet less than 10 minutes of time have been spent researching, coordinating and implementing care for this patient today.

## 2020-05-16 ENCOUNTER — Other Ambulatory Visit: Payer: Self-pay | Admitting: Nurse Practitioner

## 2020-05-16 ENCOUNTER — Telehealth: Payer: 59 | Admitting: Nurse Practitioner

## 2020-05-16 DIAGNOSIS — H00011 Hordeolum externum right upper eyelid: Secondary | ICD-10-CM | POA: Diagnosis not present

## 2020-05-16 MED ORDER — NEOMYCIN-POLYMYXIN-DEXAMETH 3.5-10000-0.1 OP SUSP: 2.0000 [drp] | Freq: Four times a day (QID) | OPHTHALMIC | 0 refills | Status: DC

## 2020-05-16 MED FILL — NEO/POLYMYXIN/DEXAMETH DROP: 3.5-10000-0 | 8 days supply | Qty: 5 | Fill #0

## 2020-05-16 NOTE — Progress Notes (Signed)
We are sorry that you are not feeling well. Here is how we plan to help! ° °Based on what you have shared with me it looks like you have a stye.  A stye is an inflammation of the eyelid.  It is often a red, painful lump near the edge of the eyelid that may look like a boil or a pimple.  A stye develops when an infection occurs at the base of an eyelash.  ° °We have made appropriate suggestions for you based upon your presentation: °Your symptoms may indicate an infection of the sclera.  The use of anti-inflammatory and antibiotic eye drops for a week will help resolve this condition.  I have sent in neomycin-polymyxin HC opthalmic suspension, two to three drops in the affected eye every 4 hours.  If your symptoms do not improve over the next two to three days you should be seen in your doctor's office. ° °HOME CARE: ° °· Wash your hands often! °· Let the stye open on its own. Don't squeeze or open it. °· Don't rub your eyes. This can irritate your eyes and let in bacteria.  If you need to touch your eyes, wash your hands first. °· Don't wear eye makeup or contact lenses until the area has healed. ° °GET HELP RIGHT AWAY IF: ° °· Your symptoms do not improve. °· You develop blurred or loss of vision. °· Your symptoms worsen (increased discharge, pain or redness). ° °Thank you for choosing an e-visit. ° °Your e-visit answers were reviewed by a board certified advanced clinical practitioner to complete your personal care plan.  Depending upon the condition, your plan could have included both over the counter or prescription medications. ° °Please review your pharmacy choice.  Make sure the pharmacy is open so you can pick up prescription now.  If there is a problem, you may contact your provider through MyChart messaging and have the prescription routed to another pharmacy.   ° °Your safety is important to us.  If you have drug allergies check your prescription carefully. ° °For the next 24 hours you can use MyChart to  ask questions about today's visit, request a non-urgent call back, or ask for a work or school excuse. ° °You will get an email in the next two days asking about your experience.  I hope you that your e-visit has been valuable and will speed your recovery. ° °5-10 minutes spent reviewing and documenting in chart. ° °

## 2020-05-29 ENCOUNTER — Telehealth: Payer: 59 | Admitting: Physician Assistant

## 2020-05-29 ENCOUNTER — Other Ambulatory Visit: Payer: Self-pay | Admitting: Physician Assistant

## 2020-05-29 DIAGNOSIS — N760 Acute vaginitis: Secondary | ICD-10-CM

## 2020-05-29 DIAGNOSIS — N898 Other specified noninflammatory disorders of vagina: Secondary | ICD-10-CM | POA: Diagnosis not present

## 2020-05-29 MED ORDER — CLINDAMYCIN PHOSPHATE 2 % VA CREA: 1.0000 | TOPICAL_CREAM | Freq: Every day | VAGINAL | 0 refills | Status: DC

## 2020-05-29 MED FILL — CLINDAMYCIN 2% VAGINAL CRM: 2 | 7 days supply | Qty: 40 | Fill #0

## 2020-05-29 NOTE — Progress Notes (Signed)
We are sorry that you are not feeling well. Here is how we plan to help! I see you have used our E-visit services multiple times over the past year or two. Please be sure you are routinely checking in with your Primary Care Provider.   Based on what you shared with me it looks like you: May have a vaginosis due to bacteria  Vaginosis is an inflammation of the vagina that can result in discharge, itching and pain. The cause is usually a change in the normal balance of vaginal bacteria or an infection. Vaginosis can also result from reduced estrogen levels after menopause.  The most common causes of vaginosis are:   Bacterial vaginosis which results from an overgrowth of one on several organisms that are normally present in your vagina.   Yeast infections which are caused by a naturally occurring fungus called candida.   Vaginal atrophy (atrophic vaginosis) which results from the thinning of the vagina from reduced estrogen levels after menopause.   Trichomoniasis which is caused by a parasite and is commonly transmitted by sexual intercourse.  Factors that increase your risk of developing vaginosis include: Marland Kitchen Medications, such as antibiotics and steroids . Uncontrolled diabetes . Use of hygiene products such as bubble bath, vaginal spray or vaginal deodorant . Douching . Wearing damp or tight-fitting clothing . Using an intrauterine device (IUD) for birth control . Hormonal changes, such as those associated with pregnancy, birth control pills or menopause . Sexual activity . Having a sexually transmitted infection  Your treatment plan is Clindamycin vaginal cream 5 grams applied vaginally for 7 days.  I have electronically sent this prescription into the pharmacy that you have chosen.  Be sure to take all of the medication as directed. Stop taking any medication if you develop a rash, tongue swelling or shortness of breath. Mothers who are breast feeding should consider pumping and  discarding their breast milk while on these antibiotics. However, there is no consensus that infant exposure at these doses would be harmful.  Remember that medication creams can weaken latex condoms. Marland Kitchen   HOME CARE:  Good hygiene may prevent some types of vaginosis from recurring and may relieve some symptoms:  . Avoid baths, hot tubs and whirlpool spas. Rinse soap from your outer genital area after a shower, and dry the area well to prevent irritation. Don't use scented or harsh soaps, such as those with deodorant or antibacterial action. Marland Kitchen Avoid irritants. These include scented tampons and pads. . Wipe from front to back after using the toilet. Doing so avoids spreading fecal bacteria to your vagina.  Other things that may help prevent vaginosis include:  Marland Kitchen Don't douche. Your vagina doesn't require cleansing other than normal bathing. Repetitive douching disrupts the normal organisms that reside in the vagina and can actually increase your risk of vaginal infection. Douching won't clear up a vaginal infection. . Use a latex condom. Both female and female latex condoms may help you avoid infections spread by sexual contact. . Wear cotton underwear. Also wear pantyhose with a cotton crotch. If you feel comfortable without it, skip wearing underwear to bed. Yeast thrives in [employer] Your symptoms should improve in the next day or two.  GET HELP RIGHT AWAY IF:  . You have pain in your lower abdomen ( pelvic area or over your ovaries) . You develop nausea or vomiting . You develop a fever . Your discharge changes or worsens . You have persistent pain with intercourse . You  develop shortness of breath, a rapid pulse, or you faint.  These symptoms could be signs of problems or infections that need to be evaluated by a medical provider now.  MAKE SURE YOU    Understand these instructions.  Will watch your condition.  Will get help right away if you are not doing well or get  worse.  Your e-visit answers were reviewed by a board certified advanced clinical practitioner to complete your personal care plan. Depending upon the condition, your plan could have included both over the counter or prescription medications. Please review your pharmacy choice to make sure that you have choses a pharmacy that is open for you to pick up any needed prescription, Your safety is important to [place]. If you have drug allergies check your prescription carefully.   You can use MyChart to ask questions about today's visit, request a non-urgent call back, or ask for a work or school excuse for 24 hours related to this e-Visit. If it has been greater than 24 hours you will need to follow up with your provider, or enter a new e-Visit to address those concerns. You will get a MyChart message within the next two days asking about your experience. I hope that your e-visit has been valuable and will speed your recovery.  Greater than 5 minutes, yet less than 10 minutes of time have been spent researching, coordinating and implementing care for this patient today.

## 2020-06-04 ENCOUNTER — Other Ambulatory Visit: Payer: Self-pay

## 2020-06-04 ENCOUNTER — Telehealth: Payer: 59 | Admitting: Family

## 2020-06-04 DIAGNOSIS — H0019 Chalazion unspecified eye, unspecified eyelid: Secondary | ICD-10-CM

## 2020-06-04 NOTE — Progress Notes (Signed)
We are sorry that you are not feeling well. Here is how we plan to help!  Based on what you have shared with me it looks like you have a chalazion. A chalazion may feel like a small, painless bead in the eyelid at first. Over a few days, it may get bigger, red, and rubbery, but remain painless. Some chalazions may not need treatment. Hot compresses may help larger ones. A lasting chalazion may need to be removed by a doctor.  If this continues, you need to see your ophthalmologist.   HOME CARE:   Wash your hands often!  Let the stye open on its own. Don't squeeze or open it.  Don't rub your eyes. This can irritate your eyes and let in bacteria.  If you need to touch your eyes, wash your hands first.  Don't wear eye makeup or contact lenses until the area has healed.  GET HELP RIGHT AWAY IF:   Your symptoms do not improve.  You develop blurred or loss of vision.  Your symptoms worsen (increased discharge, pain or redness).  Thank you for choosing an e-visit.  Your e-visit answers were reviewed by a board certified advanced clinical practitioner to complete your personal care plan.  Depending upon the condition, your plan could have included both over the counter or prescription medications.  Please review your pharmacy choice.  Make sure the pharmacy is open so you can pick up prescription now.  If there is a problem, you may contact your provider through [employer] and have the prescription routed to another pharmacy.    Your safety is important to [place].  If you have drug allergies check your prescription carefully.  For the next 24 hours you can use MyChart to ask questions about today's visit, request a non-urgent call back, or ask for a work or school excuse.  You will get an email in the next two days asking about your experience.  I hope you that your e-visit has been valuable and will speed your recovery.   Approximately 5 minutes was spent documenting and reviewing  patient's chart.

## 2020-08-29 ENCOUNTER — Telehealth: Payer: 59 | Admitting: Emergency Medicine

## 2020-08-29 ENCOUNTER — Other Ambulatory Visit (HOSPITAL_COMMUNITY): Payer: Self-pay

## 2020-08-29 DIAGNOSIS — R300 Dysuria: Secondary | ICD-10-CM

## 2020-08-29 MED ORDER — NITROFURANTOIN MONOHYD MACRO 100 MG PO CAPS
100.0000 mg | ORAL_CAPSULE | Freq: Two times a day (BID) | ORAL | 0 refills | Status: DC
  Filled 2020-08-29: qty 10, 5d supply, fill #0

## 2020-08-29 NOTE — Progress Notes (Signed)
We are sorry that you are not feeling well.  Here is how we plan to help!  Based on what you shared with me it looks like you most likely have a simple urinary tract infection.  A UTI (Urinary Tract Infection) is a bacterial infection of the bladder.  Most cases of urinary tract infections are simple to treat but a key part of your care is to encourage you to drink plenty of fluids and watch your symptoms carefully.  I have prescribed MacroBid 100 mg twice a day for 5 days.  Your symptoms should gradually improve. Call us if the burning in your urine worsens, you develop worsening fever, back pain or pelvic pain or if your symptoms do not resolve after completing the antibiotic.  Urinary tract infections can be prevented by drinking plenty of water to keep your body hydrated.  Also be sure when you wipe, wipe from front to back and don't hold it in!  If possible, empty your bladder every 4 hours.  Your e-visit answers were reviewed by a board certified advanced clinical practitioner to complete your personal care plan.  Depending on the condition, your plan could have included both over the counter or prescription medications.  If there is a problem please reply  once you have received a response from your provider.  Your safety is important to us.  If you have drug allergies check your prescription carefully.    You can use MyChart to ask questions about today's visit, request a non-urgent call back, or ask for a work or school excuse for 24 hours related to this e-Visit. If it has been greater than 24 hours you will need to follow up with your provider, or enter a new e-Visit to address those concerns.   You will get an e-mail in the next two days asking about your experience.  I hope that your e-visit has been valuable and will speed your recovery. Thank you for using e-visits.   Approximately 5 minutes was used in reviewing the patient's chart, questionnaire, prescribing medications, and  documentation.  

## 2020-09-01 ENCOUNTER — Other Ambulatory Visit (HOSPITAL_COMMUNITY): Payer: Self-pay

## 2020-09-02 ENCOUNTER — Other Ambulatory Visit (HOSPITAL_COMMUNITY): Payer: Self-pay

## 2020-09-03 ENCOUNTER — Ambulatory Visit: Payer: 59 | Admitting: Legal Medicine

## 2020-09-04 ENCOUNTER — Ambulatory Visit: Payer: 59 | Admitting: Legal Medicine

## 2020-09-09 ENCOUNTER — Ambulatory Visit: Payer: 59 | Admitting: Nurse Practitioner

## 2020-09-09 ENCOUNTER — Other Ambulatory Visit: Payer: Self-pay

## 2020-09-09 ENCOUNTER — Encounter: Payer: Self-pay | Admitting: Nurse Practitioner

## 2020-09-09 VITALS — BP 128/82 | HR 83 | Temp 97.4°F | Ht 63.5 in | Wt 196.0 lb

## 2020-09-09 DIAGNOSIS — F419 Anxiety disorder, unspecified: Secondary | ICD-10-CM

## 2020-09-09 DIAGNOSIS — F909 Attention-deficit hyperactivity disorder, unspecified type: Secondary | ICD-10-CM

## 2020-09-09 DIAGNOSIS — Z7689 Persons encountering health services in other specified circumstances: Secondary | ICD-10-CM | POA: Diagnosis not present

## 2020-09-09 MED ORDER — AMPHETAMINE-DEXTROAMPHETAMINE 10 MG PO TABS: 10.0000 mg | ORAL_TABLET | Freq: Two times a day (BID) | ORAL | 0 refills | Status: DC

## 2020-09-09 NOTE — Progress Notes (Signed)
New Patient Office Visit  Subjective:  Patient ID: Kaitlyn Sawyer, female    DOB: [birth year]  Age: 35 y.o. MRN: [id number]  CC:  Chief Complaint  Patient presents with  . Anxiety  . ADHD    HPI Kaitlyn Sawyer presents for evaluation of anxiety and adult adhd. This is her initial visit to the clinic to establish care with a primary care provider.  Anxiety Kaitlyn Sawyer tells me she was diagnosed with anxiety several years ago. States she has tried various medications. Current treatment is Effexor 150 mg daily. She is not currently in therapy for anxiety . GAD-7 score 4 in office today. Kaitlyn Sawyer states she has experienced side effects of drowsiness and fatigue with medications. States she would like to continue medication despite side effects at this time.  ADHD Kaitlyn Sawyer states she has experienced adult attention deficit hyperactivity disorder, inattentive-type for several years. She tells me she has difficulty with time management, misplacing objects, missing appointments/obligations, and feeling overwhelmed when she has to prioritize or complete several tasks She has never been treated for symptoms with cognitive behavior therapy or medications. Adult ADHD screening tool positive in-office today. She has declined cognitive behavior at this time. She would like to begin pharmacotherapy.Risks and benefits of adhd medications counseling provided. Kaitlyn Sawyer denies cardiac history or previous substance abuse.  Past Medical History:  Diagnosis Date  . Heart murmur    asymptomatic with exception during pregancy notice occ. palpitations  . History of abnormal cervical Pap smear    HPV-- S/P CRYOTHERPY  . History of gestational hypertension    DUE TO ECLAMPSIA  . History of postpartum depression   . History of seizure    x1  secondary to preclampsia 2011 (approx)  none since    Past Surgical History:  Procedure Laterality Date  . DILATION AND CURETTAGE OF UTERUS     post partem  retained placenta  . LAPAROSCOPIC TUBAL LIGATION Bilateral 01/23/2015   Procedure: LAPAROSCOPIC TUBAL LIGATION;  Surgeon: Marcelle Overlie, MD;  Location: [facility];  Service: Gynecology;  Laterality: Bilateral;  . uterine ablasion  01/06/2017  . WISDOM TOOTH EXTRACTION  age 23    Family History  Problem Relation Age of Onset  . Alcohol abuse Maternal Grandfather        liver failure  . Heart disease Paternal Grandfather        "massive MI"  . Hyperlipidemia Paternal Grandfather   . Heart attack Paternal Grandfather   . Stroke Paternal Grandmother   . Hyperlipidemia Paternal Grandmother   . Alcohol abuse Mother     Social History   Socioeconomic History  . Marital status: Divorced    Spouse name: Not on file  . Number of children: 4  . Years of education: Not on file  . Highest education level: Not on file  Occupational History  . Occupation: Sales executive  Tobacco Use  . Smoking status: Former Smoker    Packs/day: 0.50    Years: 10.00    Pack years: 5.00    Types: Cigarettes    Quit date: 04/19/2017    Years since quitting: 3.3  . Smokeless tobacco: Never Used  Vaping Use  . Vaping Use: Never used  Substance and Sexual Activity  . Alcohol use: Yes    Comment: social  . Drug use: No  . Sexual activity: Yes  Other Topics Concern  . Not on file  Social History Narrative  . Not on file   Social Determinants  of Health   Financial Resource Strain: Not on file  Food Insecurity: Not on file  Transportation Needs: Not on file  Physical Activity: Not on file  Stress: Not on file  Social Connections: Not on file  Intimate Partner Violence: Not on file    ROS Review of Systems  Constitutional: Negative for appetite change, fatigue and unexpected weight change.  HENT: Negative for congestion, ear pain, rhinorrhea, sinus pressure, sinus pain and tinnitus.   Eyes: Negative for pain.  Respiratory: Negative for cough and shortness of breath.    Cardiovascular: Negative for chest pain, palpitations and leg swelling.  Gastrointestinal: Negative for abdominal pain, constipation, diarrhea, nausea and vomiting.  Endocrine: Negative for cold intolerance, heat intolerance, polydipsia, polyphagia and polyuria.  Genitourinary: Negative for dysuria, frequency and hematuria.  Musculoskeletal: Negative for arthralgias, back pain, joint swelling and myalgias.  Skin: Negative for rash.  Allergic/Immunologic: Negative for environmental allergies.  Neurological: Negative for dizziness and headaches.  Hematological: Negative for adenopathy.  Psychiatric/Behavioral: Positive for decreased concentration. Negative for sleep disturbance. The patient is not nervous/anxious.     Objective:   Today's Vitals: BP 128/82 (BP Location: Left Arm, Patient Position: Sitting)   Pulse 83   Temp (!) 97.4 F (36.3 C) (Temporal)   Ht 5' 3.5" (1.613 m)   Wt 196 lb (88.9 kg)   SpO2 97%   BMI 34.18 kg/m   Physical Exam Vitals reviewed.  Constitutional:      Appearance: Normal appearance.  HENT:     Head: Normocephalic.     Right Ear: Tympanic membrane normal.     Left Ear: Tympanic membrane normal.     Nose: Nose normal.     Mouth/Throat:     Mouth: Mucous membranes are moist.  Eyes:     Pupils: Pupils are equal, round, and reactive to light.  Cardiovascular:     Rate and Rhythm: Normal rate and regular rhythm.     Pulses: Normal pulses.     Heart sounds: Normal heart sounds.  Pulmonary:     Effort: Pulmonary effort is normal.     Breath sounds: Normal breath sounds.  Abdominal:     General: Bowel sounds are normal.     Palpations: Abdomen is soft.  Musculoskeletal:        General: Normal range of motion.     Cervical back: Neck supple.  Skin:    General: Skin is warm and dry.     Capillary Refill: Capillary refill takes less than 2 seconds.  Neurological:     General: No focal deficit present.     Mental Status: She is alert and oriented  to person, place, and time.  Psychiatric:        Mood and Affect: Mood normal.        Behavior: Behavior normal.     Assessment & Plan:    1. Adult ADHD - amphetamine-dextroamphetamine (ADDERALL) 10 MG tablet; Take 1 tablet (10 mg total) by mouth 2 (two) times daily.  Dispense: 60 tablet; Refill: 0 -seek emergency medical attention or notify office of any adverse side effects of medication  2. Anxiety - CBC with Differential/Platelet - Comprehensive metabolic panel - TSH      Visit Diagnoses    Adult ADHD    -  Primary   Relevant Medications   amphetamine-dextroamphetamine (ADDERALL) 10 MG tablet   Anxiety       Relevant Orders   CBC with Differential/Platelet   Comprehensive metabolic panel  TSH      Outpatient Encounter Medications as of 09/09/2020  Medication Sig  . amphetamine-dextroamphetamine (ADDERALL) 10 MG tablet Take 1 tablet (10 mg total) by mouth 2 (two) times daily.  . mupirocin ointment (BACTROBAN) 2 % Apply 1 application topically 2 (two) times daily.  Marland Kitchen venlafaxine XR (EFFEXOR-XR) 150 MG 24 hr capsule TAKE 1 CAPSULE BY MOUTH ONCE A DAY  . [DISCONTINUED] azelastine (ASTELIN) 0.1 % nasal spray USE 1 SPRAY IN EACH NOSTRIL 2 TIMES DAILY AS DIRECTED  . [DISCONTINUED] benzonatate (TESSALON PERLES) 100 MG capsule Take 1 capsule (100 mg total) by mouth 3 (three) times daily as needed for cough. (Patient not taking: Reported on 04/06/2018)  . [DISCONTINUED] benzonatate (TESSALON) 100 MG capsule TAKE 1-2 CAPSULES BY MOUTH 3 TIMES DAILY AS NEEDED FOR COUGH  . [DISCONTINUED] cetirizine (ZYRTEC) 10 MG tablet Take 1 tablet (10 mg total) by mouth daily for 14 days.  . [DISCONTINUED] clindamycin (CLEOCIN) 2 % vaginal cream PLACE 1 APPLICATORFUL VAGINALLY AT BEDTIME FOR 7 DAYS.  . [DISCONTINUED] esomeprazole (NEXIUM) 40 MG capsule Take one cap by mouth before breakfast and dinner. (Patient not taking: Reported on 04/06/2018)  . [DISCONTINUED] fluticasone (FLONASE) 50  MCG/ACT nasal spray Place 2 sprays into both nostrils daily.  . [DISCONTINUED] metroNIDAZOLE (FLAGYL) 500 MG tablet Take 1 tablet (500 mg total) by mouth 2 (two) times daily.  . [DISCONTINUED] montelukast (SINGULAIR) 10 MG tablet Take 1 tablet (10 mg total) by mouth at bedtime.  . [DISCONTINUED] montelukast (SINGULAIR) 10 MG tablet Take 1 tablet (10 mg total) by mouth at bedtime.  . [DISCONTINUED] naproxen (NAPROSYN) 500 MG tablet TAKE 1 TABLET BY MOUTH 2 TIMES DAILY WITH MEALS  . [DISCONTINUED] neomycin-polymyxin b-dexamethasone (MAXITROL) 3.5-10000-0.1 SUSP PLACE 2 DROPS INTO THE RIGHT EYE EVERY 6 HOURS  . [DISCONTINUED] nitrofurantoin, macrocrystal-monohydrate, (MACROBID) 100 MG capsule Take 1 capsule (100 mg total) by mouth 2 (two) times daily.  . [DISCONTINUED] phentermine (ADIPEX-P) 37.5 MG tablet TAKE 1/2 TO 1 TABLET BY MOUTH DAILY FOR ADJUNCTIVE THERAPY TO PROMOTE WEIGHT LOSS  . [DISCONTINUED] Pseudoephedrine-APAP-DM (DAYQUIL PO) Take by mouth.  . [DISCONTINUED] venlafaxine (EFFEXOR) 37.5 MG tablet Take 75 mg by mouth 2 (two) times daily with a meal.    No facility-administered encounter medications on file as of 09/09/2020.   Contact [facility] for cognitive behavior therapy for adult adhd/anxiety Begin Adderall 10 mg twice daily for adult adhd Follow-up in 4-weeks   Follow-up: Return in about 4 weeks (around 10/07/2020) for adhd medication.   Signed, Janie Morning, NP

## 2020-09-09 NOTE — Patient Instructions (Addendum)
Contact [facility] for cognitive behavior therapy for adult adhd Begin Adderall 10 mg twice daily for adult adhd Follow-up in 4-weeks  Attention Deficit Hyperactivity Disorder, Adult Attention deficit hyperactivity disorder (ADHD) is a mental health disorder that starts during childhood (neurodevelopmental disorder). For many people with ADHD, the disorder continues into the adult years. Treatment can help you manage your symptoms. What are the causes? The exact cause of ADHD is not known. Most experts believe genetics and environmental factors contribute to ADHD. What increases the risk? The following factors may make you more likely to develop this condition:  Having a family history of ADHD.  Being female.  Being born to a mother who smoked or drank alcohol during pregnancy.  Being exposed to lead or other toxins in the womb or early in life.  Being born before 37 weeks of pregnancy (prematurely) or at a low birth weight.  Having experienced a brain injury. What are the signs or symptoms? Symptoms of this condition depend on the type of ADHD. The two main types are inattentive and hyperactive-impulsive. Some people may have symptoms of both types. Symptoms of the inattentive type include:  Difficulty paying attention.  Making careless mistakes.  Not following instructions.  Being disorganized.  Avoiding tasks that require time and attention.  Losing and forgetting things.  Being easily distracted. Symptoms of the hyperactive-impulsive type include:  Restlessness.  Talking too much.  Interrupting.  Difficulty with: ? Sitting still. ? Feeling motivated. ? Relaxing. ? Waiting in line or waiting for a turn. In adults, this condition may lead to certain problems, such as:  Keeping jobs.  Performing tasks at work.  Having stable relationships.  Being on time or keeping to a schedule. How is this diagnosed? This condition is diagnosed based on your  current symptoms and your history of symptoms. The diagnosis can be made by a health care provider such as a primary care provider or a mental health care specialist. Your health care provider may use a symptom checklist or a behavior rating scale to evaluate your symptoms. He or she may also want to talk with people who have observed your behaviors throughout your life. How is this treated? This condition can be treated with medicines and behavior therapy. Medicines may be the best option to reduce impulsive behaviors and improve attention. Your health care provider may recommend:  Stimulant medicines. These are the most common medicines used for adult ADHD. They affect certain chemicals in the brain (neurotransmitters) and improve your ability to control your symptoms.  A non-stimulant medicine for adult ADHD (atomoxetine). This medicine increases a neurotransmitter called norepinephrine. It may take weeks to months to see effects from this medicine. Counseling and behavioral management are also important for treating ADHD. Counseling is often used along with medicine. Your health care provider may suggest:  Cognitive behavioral therapy (CBT). This type of therapy teaches you to replace negative thoughts and actions with positive thoughts and actions. When used as part of ADHD treatment, this therapy may also include: ? Coping strategies for organization, time management, impulse control, and stress reduction. ? Mindfulness and meditation training.  Behavioral management. You may work with a Psychologist, occupational who is specially trained to help people with ADHD manage and organize activities and function more effectively. Follow these instructions at home: Medicines  Take over-the-counter and prescription medicines only as told by your health care provider.  Talk with your health care provider about the possible side effects of your medicines and how to  manage them.   Lifestyle  Do not use drugs.  Do not  drink alcohol if: ? Your health care provider tells you not to drink. ? You are pregnant, may be pregnant, or are planning to become pregnant.  If you drink alcohol: ? Limit how much you use to:  0-1 drink a day for women.  0-2 drinks a day for men. ? Be aware of how much alcohol is in your drink. In the [place] one drink equals one 12 oz bottle of beer (355 mL), one 5 oz glass of wine (148 mL), or one 1 oz glass of hard liquor (44 mL).  Get enough sleep.  Eat a healthy diet.  Exercise regularly. Exercise can help to reduce stress and anxiety.   General instructions  Learn as much as you can about adult ADHD, and work closely with your health care providers to find the treatments that work best for you.  Follow the same schedule each day.  Use reminder devices like notes, calendars, and phone apps to stay on time and organized.  Keep all follow-up visits as told by your health care provider and therapist. This is important. Where to find more information A health care provider may be able to recommend resources that are available online or over the phone. You could start with:  Attention Deficit Disorder Association (ADDA): http://davis-dillon.net/www.add.org  [employer](NIMH): http://www.maynard.net/www.nimh.nih.gov Contact a health care provider if:  Your symptoms continue to cause problems.  You have side effects from your medicine, such as: ? Repeated muscle twitches, coughing, or speech outbursts. ? Sleep problems. ? Loss of appetite. ? Dizziness. ? Unusually fast heartbeat. ? Stomach pains. ? Headaches.  You are struggling with anxiety, depression, or substance abuse. Get help right away if you:  Have a severe reaction to a medicine. If you ever feel like you may hurt yourself or others, or have thoughts about taking your own life, get help right away. You can go to the nearest emergency department or call:  Your local emergency services (911 in the [place]  A suicide crisis  helpline, such as the National Suicide Prevention Lifeline at 717-246-21031-(331)605-5457. This is open 24 hours a day. Summary  ADHD is a mental health disorder that starts during childhood (neurodevelopmental disorder) and often continues into the adult years.  The exact cause of ADHD is not known. Most experts believe genetics and environmental factors contribute to ADHD.  There is no cure for ADHD, but treatment with medicine, cognitive behavioral therapy, or behavioral management can help you manage your condition. This information is not intended to replace advice given to you by your health care provider. Make sure you discuss any questions you have with your health care provider. Document Revised: 08/28/2018 Document Reviewed: 08/28/2018 Elsevier Patient Education  2021 Elsevier Inc. Amphetamine; Dextroamphetamine tablets What is this medicine? AMPHETAMINE; DEXTROAMPHETAMINE(am FET a meen; dex troe am FET a meen) is used to treat attention-deficit hyperactivity disorder (ADHD). It may also be used for narcolepsy. Federal law prohibits giving this medicine to any person other than the person for whom it was prescribed. Do not share this medicine with anyone else. This medicine may be used for other purposes; ask your health care provider or pharmacist if you have questions. COMMON BRAND NAME(S): Adderall What should I tell my health care provider before I take this medicine? They need to know if you have any of these conditions:  anxiety or panic attacks  circulation problems in fingers  and toes  glaucoma  hardening or blockages of the arteries or heart blood vessels  heart disease or a heart defect  high blood pressure  history of a drug or alcohol abuse problem  history of stroke  kidney disease  liver disease  mental illness  seizures  suicidal thoughts, plans, or attempt; a previous suicide attempt by you or a family member  thyroid disease  Tourette's syndrome  an  unusual or allergic reaction to dextroamphetamine, other amphetamines, other medicines, foods, dyes, or preservatives  pregnant or trying to get pregnant  breast-feeding How should I use this medicine? Take this medicine by mouth. Take it as directed on the prescription label at the same time every day. Usually the last dose of the day will be taken at least 4 to 6 hours before bedtime, so it will not interfere with sleep. Keep taking it unless your health care provider tells you to stop. A special MedGuide will be given to you by the pharmacist with each prescription and refill. Be sure to read this information carefully each time. Talk to your health care provider about the use of this medicine in children. While it may be prescribed for children as young as 3 years for selected conditions, precautions do apply. Overdosage: If you think you have taken too much of this medicine contact a poison control center or emergency room at once. NOTE: This medicine is only for you. Do not share this medicine with others. What if I miss a dose? If you miss a dose, take it as soon as you can. If it is almost time for your next dose, take only that dose. Do not take double or extra doses. What may interact with this medicine? Do not take this medicine with any of the following medications:  MAOIs like Carbex, Eldepryl, Marplan, Nardil, and Parnate  other stimulant medicines for attention disorders This medicine may also interact with the following medications:  acetazolamide  ammonium chloride  antacids  ascorbic acid  atomoxetine  caffeine  certain medicines for blood pressure  certain medicines for depression, anxiety, or psychotic disturbances  certain medicines for seizures like carbamazepine, phenobarbital, phenytoin  certain medicines for stomach problems like cimetidine, ranitidine, famotidine, esomeprazole, omeprazole, lansoprazole, pantoprazole  lithium  medicines for colds  and breathing difficulties  medicines for diabetes  medicines or dietary supplements for weight loss or to stay awake  methenamine  narcotic medicines for pain  quinidine  ritonavir  sodium bicarbonate  St. John's wort This list may not describe all possible interactions. Give your health care provider a list of all the medicines, herbs, non-prescription drugs, or dietary supplements you use. Also tell them if you smoke, drink alcohol, or use illegal drugs. Some items may interact with your medicine. What should I watch for while using this medicine? Visit your doctor or health care professional for regular checks on your progress. This prescription requires that you follow special procedures with your doctor and pharmacy. You will need to have a new written prescription from your doctor every time you need a refill. This medicine may affect your concentration, or hide signs of tiredness. Until you know how this medicine affects you, do not drive, ride a bicycle, use machinery, or do anything that needs mental alertness. Tell your doctor or health care professional if this medicine loses its effects, or if you feel you need to take more than the prescribed amount. Do not change the dosage without talking to your doctor or health  care professional. Decreased appetite is a common side effect when starting this medicine. Eating small, frequent meals or snacks can help. Talk to your doctor if you continue to have poor eating habits. Height and weight growth of a child taking this medicine will be monitored closely. Do not take this medicine close to bedtime. It may prevent you from sleeping. If you are going to need surgery, a MRI, CT scan, or other procedure, tell your doctor that you are taking this medicine. You may need to stop taking this medicine before the procedure. Tell your doctor or healthcare professional right away if you notice unexplained wounds on your fingers and toes while  taking this medicine. You should also tell your healthcare provider if you experience numbness or pain, changes in the skin color, or sensitivity to temperature in your fingers or toes. What side effects may I notice from receiving this medicine? Side effects that you should report to your doctor or health care professional as soon as possible:  allergic reactions like skin rash, itching or hives, swelling of the face, lips, or tongue  anxious  breathing problems  changes in emotions or moods  changes in vision  chest pain or chest tightness  fast, irregular heartbeat  fingers or toes feel numb, cool, painful  hallucination, loss of contact with reality  high blood pressure  males: prolonged or painful erection  seizures  signs and symptoms of serotonin syndrome like confusion, increased sweating, fever, tremor, stiff muscles, diarrhea  signs and symptoms of a stroke like changes in vision; confusion; trouble speaking or understanding; severe headaches; sudden numbness or weakness of the face, arm or leg; trouble walking; dizziness; loss of balance or coordination  suicidal thoughts or other mood changes  uncontrollable head, mouth, neck, arm, or leg movements Side effects that usually do not require medical attention (report to your doctor or health care professional if they continue or are bothersome):  dry mouth  headache  irritability  loss of appetite  nausea  trouble sleeping  weight loss This list may not describe all possible side effects. Call your doctor for medical advice about side effects. You may report side effects to FDA at 1-800-FDA-1088. Where should I keep my medicine? Keep out of the reach of children. This medicine can be abused. Keep your medicine in a safe place to protect it from theft. Do not share this medicine with anyone. Selling or giving away this medicine is dangerous and against the law. Store at room temperature between 15 and 30  degrees C (59 and 86 degrees F). Keep container tightly closed. Throw away any unused medicine after the expiration date. Dispose of properly. This medicine may cause accidental overdose and death if it is taken by other adults, children, or pets. Mix any unused medicine with a substance like cat litter or coffee grounds. Then throw the medicine away in a sealed container like a sealed bag or a coffee can with a lid. Do not use the medicine after the expiration date. NOTE: This sheet is a summary. It may not cover all possible information. If you have questions about this medicine, talk to your doctor, pharmacist, or health care provider.  2021 Elsevier/Gold Standard (2020-01-30 14:21:45)

## 2020-09-10 ENCOUNTER — Encounter: Payer: Self-pay | Admitting: Nurse Practitioner

## 2020-09-10 ENCOUNTER — Other Ambulatory Visit: Payer: Self-pay

## 2020-09-10 LAB — CBC WITH DIFFERENTIAL/PLATELET
Basophils Absolute: 0 10*3/uL (ref 0.0–0.2)
Basos: 1 %
EOS (ABSOLUTE): 0.2 10*3/uL (ref 0.0–0.4)
Eos: 3 %
Hematocrit: 40.3 % (ref 34.0–46.6)
Hemoglobin: 13.8 g/dL (ref 11.1–15.9)
Immature Grans (Abs): 0 10*3/uL (ref 0.0–0.1)
Immature Granulocytes: 0 %
Lymphocytes Absolute: 2.5 10*3/uL (ref 0.7–3.1)
Lymphs: 29 %
MCH: 29.3 pg (ref 26.6–33.0)
MCHC: 34.2 g/dL (ref 31.5–35.7)
MCV: 86 fL (ref 79–97)
Monocytes Absolute: 0.5 10*3/uL (ref 0.1–0.9)
Monocytes: 6 %
Neutrophils Absolute: 5.2 10*3/uL (ref 1.4–7.0)
Neutrophils: 61 %
Platelets: 311 10*3/uL (ref 150–450)
RBC: 4.71 x10E6/uL (ref 3.77–5.28)
RDW: 12.4 % (ref 11.7–15.4)
WBC: 8.4 10*3/uL (ref 3.4–10.8)

## 2020-09-10 LAB — COMPREHENSIVE METABOLIC PANEL
ALT: 66 IU/L — ABNORMAL HIGH (ref 0–32)
AST: 38 IU/L (ref 0–40)
Albumin/Globulin Ratio: 1.6 (ref 1.2–2.2)
Albumin: 4.6 g/dL (ref 3.8–4.8)
Alkaline Phosphatase: 66 IU/L (ref 44–121)
BUN/Creatinine Ratio: 15 (ref 9–23)
BUN: 12 mg/dL (ref 6–20)
Bilirubin Total: <0.2 mg/dL (ref 0.0–1.2)
CO2: 22 mmol/L (ref 20–29)
Calcium: 9.6 mg/dL (ref 8.7–10.2)
Chloride: 100 mmol/L (ref 96–106)
Creatinine, Ser: 0.79 mg/dL (ref 0.57–1.00)
Globulin, Total: 2.8 g/dL (ref 1.5–4.5)
Glucose: 103 mg/dL — ABNORMAL HIGH (ref 65–99)
Potassium: 4.2 mmol/L (ref 3.5–5.2)
Sodium: 139 mmol/L (ref 134–144)
Total Protein: 7.4 g/dL (ref 6.0–8.5)
eGFR: 101 mL/min/{1.73_m2} (ref >59)

## 2020-09-10 LAB — TSH: TSH: 0.961 u[IU]/mL (ref 0.450–4.500)

## 2020-09-10 MED ORDER — VENLAFAXINE HCL ER 150 MG PO CP24: ORAL_CAPSULE | Freq: Every day | ORAL | 1 refills | Status: DC

## 2020-09-10 NOTE — Telephone Encounter (Signed)
Pt called about effexor refill. But also questioning if shannon believed she would be stable enough to stop effexor and just take adderall. If so how does she need to stop the effexor? Please advise.   Lorita Officer, [place] 09/10/20 1:46 PM

## 2020-09-12 ENCOUNTER — Other Ambulatory Visit (HOSPITAL_COMMUNITY): Payer: Self-pay

## 2020-09-12 MED ORDER — VENLAFAXINE HCL ER 150 MG PO CP24
150.0000 mg | ORAL_CAPSULE | Freq: Every day | ORAL | 1 refills | Status: DC
  Filled 2020-09-12: qty 90, 90d supply, fill #0

## 2020-09-13 ENCOUNTER — Other Ambulatory Visit (HOSPITAL_COMMUNITY): Payer: Self-pay

## 2020-09-13 ENCOUNTER — Telehealth: Payer: 59 | Admitting: Emergency Medicine

## 2020-09-13 DIAGNOSIS — L298 Other pruritus: Secondary | ICD-10-CM

## 2020-09-13 MED ORDER — PREDNISONE 5 MG PO TABS: ORAL_TABLET | ORAL | 0 refills | Status: DC

## 2020-09-13 MED ORDER — CETIRIZINE HCL 10 MG PO TABS: 10.0000 mg | ORAL_TABLET | Freq: Every day | ORAL | 0 refills | Status: DC

## 2020-09-13 NOTE — Progress Notes (Signed)
E Visit for Rash  We are sorry that you are not feeling well. Here is how we plan to help!   Based on what you shared with me you may have a virus or an allergic reaction.    Prednisone 5 mg daily for 6 days (see taper instructions below)  I am also sending in a prescription for an antihistamine, cetirizine 10mg  once daily, to help with itching.    HOME CARE:   Take cool showers and avoid direct sunlight.  Apply cool compress or wet dressings.  Take a bath in an oatmeal bath.  Sprinkle content of one Aveeno packet under running faucet with comfortably warm water.  Bathe for 15-20 minutes, 1-2 times daily.  Pat dry with a towel. Do not rub the rash.  Use hydrocortisone cream.  Take an antihistamine like Benadryl for widespread rashes that itch.  The adult dose of Benadryl is 25-50 mg by mouth 4 times daily.  Caution:  This type of medication may cause sleepiness.  Do not drink alcohol, drive, or operate dangerous machinery while taking antihistamines.  Do not take these medications if you have prostate enlargement.  Read package instructions thoroughly on all medications that you take.  GET HELP RIGHT AWAY IF:   Symptoms don't go away after treatment.  Severe itching that persists.  If you rash spreads or swells.  If you rash begins to smell.  If it blisters and opens or develops a yellow-brown crust.  You develop a fever.  You have a sore throat.  You become short of breath.  MAKE SURE YOU:  Understand these instructions. Will watch your condition. Will get help right away if you are not doing well or get worse.  Thank you for choosing an e-visit. Your e-visit answers were reviewed by a board certified advanced clinical practitioner to complete your personal care plan. Depending upon the condition, your plan could have included both over the counter or prescription medications. Please review your pharmacy choice. Be sure that the pharmacy you have chosen is open  so that you can pick up your prescription now.  If there is a problem you may message your provider in MyChart to have the prescription routed to another pharmacy. Your safety is important to . If you have drug allergies check your prescription carefully.  For the next 24 hours, you can use MyChart to ask questions about today's visit, request a non-urgent call back, or ask for a work or school excuse from your e-visit provider. You will get an email in the next two days asking about your experience. I hope that your e-visit has been valuable and will speed your recovery.     Approximately 5 minutes was spent documenting and reviewing patient's chart.

## 2020-09-18 ENCOUNTER — Encounter: Payer: Self-pay | Admitting: Nurse Practitioner

## 2020-09-25 ENCOUNTER — Telehealth: Payer: 59 | Admitting: Physician Assistant

## 2020-09-25 DIAGNOSIS — N760 Acute vaginitis: Secondary | ICD-10-CM | POA: Diagnosis not present

## 2020-09-25 DIAGNOSIS — B9689 Other specified bacterial agents as the cause of diseases classified elsewhere: Secondary | ICD-10-CM | POA: Diagnosis not present

## 2020-09-25 MED ORDER — CLINDAMYCIN PHOSPHATE 2 % VA CREA: 1.0000 | TOPICAL_CREAM | Freq: Every day | VAGINAL | 0 refills | Status: DC

## 2020-09-25 NOTE — Progress Notes (Signed)

## 2020-09-25 NOTE — Progress Notes (Signed)
I have spent 5 minutes in review of e-visit questionnaire, review and updating patient chart, medical decision making and response to patient.   Poppi Scantling Cody Mairlyn Tegtmeyer, PA-C    

## 2020-10-07 ENCOUNTER — Other Ambulatory Visit: Payer: Self-pay

## 2020-10-07 ENCOUNTER — Ambulatory Visit: Payer: 59 | Admitting: Nurse Practitioner

## 2020-10-07 ENCOUNTER — Other Ambulatory Visit (HOSPITAL_COMMUNITY): Payer: Self-pay

## 2020-10-07 ENCOUNTER — Other Ambulatory Visit: Payer: Self-pay | Admitting: Nurse Practitioner

## 2020-10-07 ENCOUNTER — Encounter: Payer: Self-pay | Admitting: Nurse Practitioner

## 2020-10-07 VITALS — BP 136/80 | HR 109 | Temp 96.2°F | Ht 63.0 in | Wt 191.0 lb

## 2020-10-07 DIAGNOSIS — F419 Anxiety disorder, unspecified: Secondary | ICD-10-CM

## 2020-10-07 DIAGNOSIS — F909 Attention-deficit hyperactivity disorder, unspecified type: Secondary | ICD-10-CM

## 2020-10-07 MED ORDER — VENLAFAXINE HCL ER 37.5 MG PO CP24
75.0000 mg | ORAL_CAPSULE | Freq: Every day | ORAL | 0 refills | Status: DC
  Filled 2020-10-07: qty 60, 30d supply, fill #0

## 2020-10-07 MED ORDER — AMPHETAMINE-DEXTROAMPHETAMINE 10 MG PO TABS
10.0000 mg | ORAL_TABLET | Freq: Two times a day (BID) | ORAL | 0 refills | Status: DC
  Filled 2020-10-07: qty 60, 30d supply, fill #0

## 2020-10-07 NOTE — Progress Notes (Signed)
Subjective:  Patient ID: Kaitlyn Sawyer, female    DOB: 1986-03-07  Age: 35 y.o. MRN: [id number]  Chief Complaint  Patient presents with   ADHD    HPI  Kaitlyn Sawyer is a 35 year old Caucasian female that is present to follow-up on adult ADHD medication. She states she is having good relief of ADHD symptoms after beginning Adderall 10 mg BID. She denies any side effects.   Kaitlyn Sawyer is prescribed Effexor 150 mg daily for anxiety. States she wants to taper off medication due to side effects of excess sweating and fatigue. She states she is concerned that she will have withdrawal from discontinuing medication.    Current Outpatient Medications on File Prior to Visit  Medication Sig Dispense Refill   amphetamine-dextroamphetamine (ADDERALL) 10 MG tablet Take 1 tablet (10 mg total) by mouth 2 (two) times daily. 60 tablet 0   mupirocin ointment (BACTROBAN) 2 % Apply 1 application topically 2 (two) times daily. 22 g 0   venlafaxine XR (EFFEXOR-XR) 150 MG 24 hr capsule TAKE 1 CAPSULE BY MOUTH ONCE A DAY 90 capsule 1   No current facility-administered medications on file prior to visit.   Past Medical History:  Diagnosis Date   Heart murmur    asymptomatic with exception during pregancy notice occ. palpitations   History of abnormal cervical Pap smear    HPV-- S/P CRYOTHERPY   History of gestational hypertension    DUE TO ECLAMPSIA   History of postpartum depression    History of seizure    x1  secondary to preclampsia 2011 (approx)  none since   Past Surgical History:  Procedure Laterality Date   DILATION AND CURETTAGE OF UTERUS     post partem retained placenta   LAPAROSCOPIC TUBAL LIGATION Bilateral 01/23/2015   Procedure: LAPAROSCOPIC TUBAL LIGATION;  Surgeon: Marcelle Overlie, MD;  Location: [facility];  Service: Gynecology;  Laterality: Bilateral;   uterine ablasion  01/06/2017   WISDOM TOOTH EXTRACTION  age 56    Family History  Problem Relation Age of Onset    Alcohol abuse Maternal Grandfather        liver failure   Heart disease Paternal Grandfather        "massive MI"   Hyperlipidemia Paternal Grandfather    Heart attack Paternal Grandfather    Stroke Paternal Grandmother    Hyperlipidemia Paternal Grandmother    Alcohol abuse Mother    Social History   Socioeconomic History   Marital status: Divorced    Spouse name: Not on file   Number of children: 4   Years of education: Not on file   Highest education level: Not on file  Occupational History   Occupation: Sales executive  Tobacco Use   Smoking status: Former    Packs/day: 0.50    Years: 10.00    Pack years: 5.00    Types: Cigarettes    Quit date: 04/19/2017    Years since quitting: 3.4   Smokeless tobacco: Never  Vaping Use   Vaping Use: Never used  Substance and Sexual Activity   Alcohol use: Yes    Comment: social   Drug use: No   Sexual activity: Yes  Other Topics Concern   Not on file  Social History Narrative   Not on file    Review of Systems  Constitutional:  Positive for fatigue. Negative for chills and fever.  HENT:  Negative for congestion, ear pain, rhinorrhea and sore throat.   Eyes: Negative.   Respiratory:  Negative for cough and shortness of breath.   Cardiovascular:  Negative for chest pain.  Gastrointestinal:  Negative for abdominal pain, constipation, diarrhea, nausea and vomiting.  Endocrine: Negative.        Excessive sweating  Genitourinary:  Negative for dysuria and urgency.  Musculoskeletal:  Negative for back pain and myalgias.  Skin:  Positive for rash.  Allergic/Immunologic: Negative.   Neurological:  Negative for dizziness, weakness, light-headedness and headaches.  Psychiatric/Behavioral:  Negative for dysphoric mood. The patient is not nervous/anxious.     Objective:  BP 136/80   Pulse (!) 109   Temp (!) 96.2 F (35.7 C)   Ht 5\' 3"  (1.6 m)   Wt 191 lb (86.6 kg)   SpO2 94%   BMI 33.83 kg/m   BP/Weight 10/07/2020  09/09/2020 04/06/2018  Systolic BP 136 128 125  Diastolic BP 80 82 80  Wt. (Lbs) 191 196 180.2  BMI 33.83 34.18 31.92    Physical Exam Vitals reviewed.  Constitutional:      Appearance: Normal appearance. She is obese.  Cardiovascular:     Rate and Rhythm: Tachycardia present.     Pulses: Normal pulses.     Heart sounds: Normal heart sounds.  Pulmonary:     Effort: Pulmonary effort is normal.     Breath sounds: Normal breath sounds.  Abdominal:     General: Bowel sounds are normal.     Palpations: Abdomen is soft.  Skin:    General: Skin is warm and dry.     Capillary Refill: Capillary refill takes less than 2 seconds.  Neurological:     General: No focal deficit present.     Mental Status: She is alert and oriented to person, place, and time.  Psychiatric:        Mood and Affect: Mood normal.        Behavior: Behavior normal.        Lab Results  Component Value Date   WBC 8.4 09/09/2020   HGB 13.8 09/09/2020   HCT 40.3 09/09/2020   PLT 311 09/09/2020   GLUCOSE 103 (H) 09/09/2020   ALT 66 (H) 09/09/2020   AST 38 09/09/2020   NA 139 09/09/2020   K 4.2 09/09/2020   CL 100 09/09/2020   CREATININE 0.79 09/09/2020   BUN 12 09/09/2020   CO2 22 09/09/2020   TSH 0.961 09/09/2020      Assessment & Plan:   1. Adult ADHD-improved -adderall 10 mg twice daily  2. Anxiety - venlafaxine XR (EFFEXOR XR) 37.5 MG 24 hr capsule; Take 2 capsules (75 mg total) by mouth daily with breakfast.  Dispense: 60 capsule; Refill: 0   Decrease Effexor to 75 mg daily for two weeks, then decrease to 37.5 mg for one week, then decrease to 37.5 mg every other day, then discontinue. Notify office immediately of any adverse side effects.  Follow-up: 88-months  An After Visit Summary was printed and given to the patient.  Signed, 2-month, NP [facility] [phone]

## 2020-10-07 NOTE — Patient Instructions (Addendum)
Decrease Effexor to 75 mg daily for two weeks, then decrease to 37.5 mg for one week, then decrease to 37.5 mg every other day, then discontinue. Notify office immediately of any adverse side effects. Continue Adderall 10 mg twice daily for ADHD Follow-up 22-months   Attention Deficit Hyperactivity Disorder, Adult Attention deficit hyperactivity disorder (ADHD) is a mental health disorder that starts during childhood (neurodevelopmental disorder). For many people with ADHD, the disorder continues into the adult years.Treatment can help you manage your symptoms. What are the causes? The exact cause of ADHD is not known. Most experts believe genetics andenvironmental factors contribute to ADHD. What increases the risk? The following factors may make you more likely to develop this condition: Having a family history of ADHD. Being female. Being born to a mother who smoked or drank alcohol during pregnancy. Being exposed to lead or other toxins in the womb or early in life. Being born before 37 weeks of pregnancy (prematurely) or at a low birth weight. Having experienced a brain injury. What are the signs or symptoms? Symptoms of this condition depend on the type of ADHD. The two main types are inattentive and hyperactive-impulsive. Some people may have symptoms of bothtypes. Symptoms of the inattentive type include: Difficulty paying attention. Making careless mistakes. Not following instructions. Being disorganized. Avoiding tasks that require time and attention. Losing and forgetting things. Being easily distracted. Symptoms of the hyperactive-impulsive type include: Restlessness. Talking too much. Interrupting. Difficulty with: Sitting still. Feeling motivated. Relaxing. Waiting in line or waiting for a turn. In adults, this condition may lead to certain problems, such as: Keeping jobs. Performing tasks at work. Having stable relationships. Being on time or keeping to a  schedule. How is this diagnosed? This condition is diagnosed based on your current symptoms and your history of symptoms. The diagnosis can be made by a health care provider such as a primarycare provider or a mental health care specialist. Your health care provider may use a symptom checklist or a behavior rating scale to evaluate your symptoms. He or she may also want to talk with peoplewho have observed your behaviors throughout your life. How is this treated? This condition can be treated with medicines and behavior therapy. Medicines may be the best option to reduce impulsive behaviors and improve attention. Your health care provider may recommend: Stimulant medicines. These are the most common medicines used for adult ADHD. They affect certain chemicals in the brain (neurotransmitters) and improve your ability to control your symptoms. A non-stimulant medicine for adult ADHD (atomoxetine). This medicine increases a neurotransmitter called norepinephrine. It may take weeks to months to see effects from this medicine. Counseling and behavioral management are also important for treating ADHD. Counseling is often used along with medicine. Your health care provider may suggest: Cognitive behavioral therapy (CBT). This type of therapy teaches you to replace negative thoughts and actions with positive thoughts and actions. When used as part of ADHD treatment, this therapy may also include: Coping strategies for organization, time management, impulse control, and stress reduction. Mindfulness and meditation training. Behavioral management. You may work with a Psychologist, occupational who is specially trained to help people with ADHD manage and organize activities and function more effectively. Follow these instructions at home: Medicines  Take over-the-counter and prescription medicines only as told by your health care provider. Talk with your health care provider about the possible side effects of your medicines and  how to manage them.  Lifestyle  Do not use drugs. Do not drink  alcohol if: Your health care provider tells you not to drink. You are pregnant, may be pregnant, or are planning to become pregnant. If you drink alcohol: Limit how much you use to: 0-1 drink a day for women. 0-2 drinks a day for men. Be aware of how much alcohol is in your drink. In the [place] one drink equals one 12 oz bottle of beer (355 mL), one 5 oz glass of wine (148 mL), or one 1 oz glass of hard liquor (44 mL). Get enough sleep. Eat a healthy diet. Exercise regularly. Exercise can help to reduce stress and anxiety.  General instructions Learn as much as you can about adult ADHD, and work closely with your health care providers to find the treatments that work best for you. Follow the same schedule each day. Use reminder devices like notes, calendars, and phone apps to stay on time and organized. Keep all follow-up visits as told by your health care provider and therapist. This is important. Where to find more information A health care provider may be able to recommend resources that are available online or over the phone. You could start with: Attention Deficit Disorder Association (ADDA): http://davis-dillon.net/ [employer] [facility]): http://www.maynard.net/ Contact a health care provider if: Your symptoms continue to cause problems. You have side effects from your medicine, such as: Repeated muscle twitches, coughing, or speech outbursts. Sleep problems. Loss of appetite. Dizziness. Unusually fast heartbeat. Stomach pains. Headaches. You are struggling with anxiety, depression, or substance abuse. Get help right away if you: Have a severe reaction to a medicine. If you ever feel like you may hurt yourself or others, or have thoughts about taking your own life, get help right away. You can go to the nearest emergency department or call: Your local emergency services (911 in the [place] A suicide crisis  helpline, such as the National Suicide Prevention Lifeline at 867-059-9357. This is open 24 hours a day. Summary ADHD is a mental health disorder that starts during childhood (neurodevelopmental disorder) and often continues into the adult years. The exact cause of ADHD is not known. Most experts believe genetics and environmental factors contribute to ADHD. There is no cure for ADHD, but treatment with medicine, cognitive behavioral therapy, or behavioral management can help you manage your condition. This information is not intended to replace advice given to you by your health care provider. Make sure you discuss any questions you have with your healthcare provider. Document Revised: 08/28/2018 Document Reviewed: 08/28/2018 Elsevier Patient Education  2022 [employer].

## 2020-10-14 ENCOUNTER — Encounter: Payer: Self-pay | Admitting: Nurse Practitioner

## 2020-10-28 ENCOUNTER — Encounter: Payer: Self-pay | Admitting: Nurse Practitioner

## 2020-10-28 ENCOUNTER — Other Ambulatory Visit: Payer: Self-pay | Admitting: Nurse Practitioner

## 2020-11-03 ENCOUNTER — Other Ambulatory Visit (HOSPITAL_COMMUNITY): Payer: Self-pay

## 2020-11-03 ENCOUNTER — Other Ambulatory Visit: Payer: Self-pay | Admitting: Dentistry

## 2020-11-03 MED ORDER — ONDANSETRON HCL 4 MG PO TABS
4.0000 mg | ORAL_TABLET | Freq: Every day | ORAL | 1 refills | Status: AC | PRN
  Filled 2020-11-03: qty 30, 30d supply, fill #0

## 2020-11-04 NOTE — Progress Notes (Signed)
Established Patient Office Visit  Subjective:  Patient ID: Kaitlyn Sawyer, female    DOB: [birth year]  Age: 35 y.o. MRN: [id number]  CC:  Chief Complaint  Patient presents with   Depression    Discuss medication change    HPI Kaitlyn Sawyer presents for follow-up of depression/anxiety medication change. Kaitlyn Sawyer has been prescribed Effexor for several years but has been experiencing adverse side effects of weight gain, excessive sweating, and fatigue. She stated she wanted to wean off of medication. An Effexor taper began on 10/07/20. Unfortunately, she experienced withdrawal side effects of headache, nausea, and diarrhea that has persisted. She treated nausea with zofran and headache with Tylenol. She began Trintellix 5 mg on 10/29/20. Trintellix was increased to 10 mg daily on 11/04/20. She tells me that diarrhea has subsided, nausea and headache have improved today.    GAD-7 Results GAD-7 Generalized Anxiety Disorder Screening Tool 09/09/2020  1. Feeling Nervous, Anxious, or on Edge 1  2. Not Being Able to Stop or Control Worrying 1  3. Worrying Too Much About Different Things 1  4. Trouble Relaxing 0  5. Being So Restless it's Hard To Sit Still 0  6. Becoming Easily Annoyed or Irritable 1  7. Feeling Afraid As If Something Awful Might Happen 0  Total GAD-7 Score 4  Difficulty At Work, Home, or Getting  Along With Others? Somewhat difficult    PHQ-9 Scores PHQ9 SCORE ONLY 09/09/2020 09/09/2020  PHQ-9 Total Score 0 0         Past Medical History:  Diagnosis Date   Heart murmur    asymptomatic with exception during pregancy notice occ. palpitations   History of abnormal cervical Pap smear    HPV-- S/P CRYOTHERPY   History of gestational hypertension    DUE TO ECLAMPSIA   History of postpartum depression    History of seizure    x1  secondary to preclampsia 2011 (approx)  none since    Past Surgical History:  Procedure Laterality Date   DILATION AND CURETTAGE OF  UTERUS     post partem retained placenta   LAPAROSCOPIC TUBAL LIGATION Bilateral 01/23/2015   Procedure: LAPAROSCOPIC TUBAL LIGATION;  Surgeon: Dian Queen, MD;  Location: [facility];  Service: Gynecology;  Laterality: Bilateral;   uterine ablasion  01/06/2017   WISDOM TOOTH EXTRACTION  age 28    Family History  Problem Relation Age of Onset   Alcohol abuse Maternal Grandfather        liver failure   Heart disease Paternal Grandfather        "massive MI"   Hyperlipidemia Paternal Grandfather    Heart attack Paternal Grandfather    Stroke Paternal Grandmother    Hyperlipidemia Paternal Grandmother    Alcohol abuse Mother     Social History   Socioeconomic History   Marital status: Divorced    Spouse name: Not on file   Number of children: 4   Years of education: Not on file   Highest education level: Not on file  Occupational History   Occupation: Art therapist  Tobacco Use   Smoking status: Former    Packs/day: 0.50    Years: 10.00    Pack years: 5.00    Types: Cigarettes    Quit date: 04/19/2017    Years since quitting: 3.5   Smokeless tobacco: Never  Vaping Use   Vaping Use: Never used  Substance and Sexual Activity   Alcohol use: Yes    Comment:  social   Drug use: No   Sexual activity: Yes  Other Topics Concern   Not on file  Social History Narrative   Not on file   Social Determinants of Health   Financial Resource Strain: Not on file  Food Insecurity: Not on file  Transportation Needs: Not on file  Physical Activity: Not on file  Stress: Not on file  Social Connections: Not on file  Intimate Partner Violence: Not on file    Outpatient Medications Prior to Visit  Medication Sig Dispense Refill   amphetamine-dextroamphetamine (ADDERALL) 10 MG tablet Take 1 tablet (10 mg total) by mouth 2 (two) times daily. 60 tablet 0   mupirocin ointment (BACTROBAN) 2 % Apply 1 application topically 2 (two) times daily. 22 g 0   ondansetron  (ZOFRAN) 4 MG tablet Take 1 tablet (4 mg total) by mouth daily as needed for nausea or vomiting. 30 tablet 1   No facility-administered medications prior to visit.    Allergies  Allergen Reactions   Diclegis [Doxylamine-Pyridoxine] Hives, Itching and Rash   Penicillins Swelling and Rash    Has patient had a PCN reaction causing immediate rash, facial/tongue/throat swelling, SOB or lightheadedness with hypotension: no Has patient had a PCN reaction causing severe rash involving mucus membranes or skin necrosis: unknown Has patient had a PCN reaction that required hospitalization:no Has patient had a PCN reaction occurring within the last 10 years:no If all of the above answers are "NO", then may proceed with Cephalosporin use.     ROS Review of Systems  Constitutional:  Positive for fatigue. Negative for appetite change and fever.  HENT:  Negative for congestion, ear pain, sinus pressure and sore throat.   Eyes:  Negative for pain.  Respiratory:  Negative for cough, chest tightness, shortness of breath and wheezing.   Cardiovascular:  Negative for chest pain and palpitations.  Gastrointestinal:  Positive for diarrhea and nausea. Negative for abdominal pain, constipation and vomiting.  Endocrine: Negative.   Genitourinary:  Negative for dysuria and hematuria.  Musculoskeletal:  Negative for arthralgias, back pain, joint swelling and myalgias.  Skin:  Negative for rash.  Allergic/Immunologic: Negative.   Neurological:  Positive for dizziness and headaches. Negative for weakness.  Psychiatric/Behavioral:  Negative for dysphoric mood. The patient is not nervous/anxious.      Objective:    Physical Exam Vitals reviewed.  Constitutional:      Appearance: Normal appearance. She is well-developed.  Pulmonary:     Effort: Pulmonary effort is normal. No respiratory distress.  Abdominal:     Tenderness: There is no abdominal tenderness.  Skin:    General: Skin is warm and dry.      Capillary Refill: Capillary refill takes less than 2 seconds.  Neurological:     General: No focal deficit present.     Mental Status: She is alert and oriented to person, place, and time.  Psychiatric:        Mood and Affect: Mood normal.        Behavior: Behavior normal.    BP 132/68 (BP Location: Left Arm, Patient Position: Sitting)   Pulse 86   Temp 97.6 F (36.4 C) (Temporal)   Ht '5\' 3"'  (1.6 m)   Wt 196 lb (88.9 kg)   SpO2 97%   BMI 34.72 kg/m  Wt Readings from Last 3 Encounters:  11/05/20 196 lb (88.9 kg)  10/07/20 191 lb (86.6 kg)  09/09/20 196 lb (88.9 kg)     Health Maintenance Due  Topic Date Due   COVID-19 Vaccine (1) Never done   Pneumococcal Vaccine 76-41 Years old (1 - PCV) Never done   Hepatitis C Screening  Never done   TETANUS/TDAP  Never done   PAP SMEAR-Modifier  Never done     Lab Results  Component Value Date   TSH 0.961 09/09/2020   Lab Results  Component Value Date   WBC 8.4 09/09/2020   HGB 13.8 09/09/2020   HCT 40.3 09/09/2020   MCV 86 09/09/2020   PLT 311 09/09/2020   Lab Results  Component Value Date   NA 139 09/09/2020   K 4.2 09/09/2020   CO2 22 09/09/2020   GLUCOSE 103 (H) 09/09/2020   BUN 12 09/09/2020   CREATININE 0.79 09/09/2020   BILITOT <0.2 09/09/2020   ALKPHOS 66 09/09/2020   AST 38 09/09/2020   ALT 66 (H) 09/09/2020   PROT 7.4 09/09/2020   ALBUMIN 4.6 09/09/2020   CALCIUM 9.6 09/09/2020   ANIONGAP 7 03/19/2016   EGFR 101 09/09/2020        Assessment & Plan:   1. Depression, major, single episode, moderate (HCC) - vortioxetine HBr (TRINTELLIX) 10 MG TABS tablet; Take 1 tablet (10 mg total) by mouth daily.  Dispense: 30 tablet; Refill: 1 - vortioxetine HBr (TRINTELLIX) 10 MG TABS tablet; Take 1 tablet (10 mg total) by mouth daily.  Dispense: 28 tablet; Refill: 0  2. Anxiety - vortioxetine HBr (TRINTELLIX) 10 MG TABS tablet; Take 1 tablet (10 mg total) by mouth daily.  Dispense: 30 tablet; Refill: 1 -  vortioxetine HBr (TRINTELLIX) 10 MG TABS tablet; Take 1 tablet (10 mg total) by mouth daily.  Dispense: 28 tablet; Refill: 0  3. Adult ADHD - amphetamine-dextroamphetamine (ADDERALL) 10 MG tablet; Take two tablets (91m) by mouth every morning  and one tablet (10 mg) by mouth every afternoon  Dispense: 90 tablet; Refill: 0   Take Zofran as needed for nausea Increase Adderall to 20 mg in the a.m. and 10 mg in the p.m. Continue Trintellix 10 mg daily Follow-up in 4-weeks, virtual visit Notify office of any adverse side effects    Follow-up: 4-weeks, virtual   I,Lauren M Auman,acting as a sEducation administratorfor [employer] NP.,have documented all relevant documentation on the behalf of SRip Harbour NP,as directed by  SRip Harbour NP while in the presence of SRip Harbour NP.   I, SRip Harbour NP, have reviewed all documentation for this visit. The documentation on 11/05/20 for the exam, diagnosis, procedures, and orders are all accurate and complete.   Signed, SJerrell Belfast DNP

## 2020-11-04 NOTE — Telephone Encounter (Signed)
I have scheduled the patient for an appointment with Carollee Herter for tomorrow at 4 PM.

## 2020-11-05 ENCOUNTER — Other Ambulatory Visit: Payer: Self-pay

## 2020-11-05 ENCOUNTER — Encounter: Payer: Self-pay | Admitting: Nurse Practitioner

## 2020-11-05 ENCOUNTER — Ambulatory Visit: Payer: 59 | Admitting: Nurse Practitioner

## 2020-11-05 ENCOUNTER — Other Ambulatory Visit (HOSPITAL_COMMUNITY): Payer: Self-pay

## 2020-11-05 VITALS — BP 132/68 | HR 86 | Temp 97.6°F | Ht 63.0 in | Wt 196.0 lb

## 2020-11-05 DIAGNOSIS — F419 Anxiety disorder, unspecified: Secondary | ICD-10-CM | POA: Diagnosis not present

## 2020-11-05 DIAGNOSIS — F321 Major depressive disorder, single episode, moderate: Secondary | ICD-10-CM | POA: Diagnosis not present

## 2020-11-05 DIAGNOSIS — F909 Attention-deficit hyperactivity disorder, unspecified type: Secondary | ICD-10-CM | POA: Diagnosis not present

## 2020-11-05 MED ORDER — AMPHETAMINE-DEXTROAMPHETAMINE 10 MG PO TABS
20.0000 mg | ORAL_TABLET | Freq: Two times a day (BID) | ORAL | 0 refills | Status: DC
  Filled 2020-11-05 (×2): qty 90, 30d supply, fill #0

## 2020-11-05 MED ORDER — VORTIOXETINE HBR 10 MG PO TABS
10.0000 mg | ORAL_TABLET | Freq: Every day | ORAL | 1 refills | Status: DC
  Filled 2020-11-05 – 2020-12-10 (×2): qty 30, 30d supply, fill #0

## 2020-11-05 MED ORDER — VORTIOXETINE HBR 10 MG PO TABS: 10.0000 mg | ORAL_TABLET | Freq: Every day | ORAL | 0 refills | Status: DC

## 2020-11-05 NOTE — Patient Instructions (Addendum)
Take Zofran as needed for nausea Increase Adderall to 20 mg in the a.m. and 10 mg in the p.m. Continue Trintellix 10 mg daily Follow-up in 4-weeks, virtual visit Notify office of any adverse side effects  Attention Deficit Hyperactivity Disorder, Adult Attention deficit hyperactivity disorder (ADHD) is a mental health disorder that starts during childhood (neurodevelopmental disorder). For many people with ADHD, the disorder continues into the adult years.Treatment can help you manage your symptoms. What are the causes? The exact cause of ADHD is not known. Most experts believe genetics andenvironmental factors contribute to ADHD. What increases the risk? The following factors may make you more likely to develop this condition: Having a family history of ADHD. Being female. Being born to a mother who smoked or drank alcohol during pregnancy. Being exposed to lead or other toxins in the womb or early in life. Being born before 37 weeks of pregnancy (prematurely) or at a low birth weight. Having experienced a brain injury. What are the signs or symptoms? Symptoms of this condition depend on the type of ADHD. The two main types are inattentive and hyperactive-impulsive. Some people may have symptoms of bothtypes. Symptoms of the inattentive type include: Difficulty paying attention. Making careless mistakes. Not following instructions. Being disorganized. Avoiding tasks that require time and attention. Losing and forgetting things. Being easily distracted. Symptoms of the hyperactive-impulsive type include: Restlessness. Talking too much. Interrupting. Difficulty with: Sitting still. Feeling motivated. Relaxing. Waiting in line or waiting for a turn. In adults, this condition may lead to certain problems, such as: Keeping jobs. Performing tasks at work. Having stable relationships. Being on time or keeping to a schedule. How is this diagnosed? This condition is diagnosed based  on your current symptoms and your history of symptoms. The diagnosis can be made by a health care provider such as a primarycare provider or a mental health care specialist. Your health care provider may use a symptom checklist or a behavior rating scale to evaluate your symptoms. He or she may also want to talk with peoplewho have observed your behaviors throughout your life. How is this treated? This condition can be treated with medicines and behavior therapy. Medicines may be the best option to reduce impulsive behaviors and improve attention. Your health care provider may recommend: Stimulant medicines. These are the most common medicines used for adult ADHD. They affect certain chemicals in the brain (neurotransmitters) and improve your ability to control your symptoms. A non-stimulant medicine for adult ADHD (atomoxetine). This medicine increases a neurotransmitter called norepinephrine. It may take weeks to months to see effects from this medicine. Counseling and behavioral management are also important for treating ADHD. Counseling is often used along with medicine. Your health care provider may suggest: Cognitive behavioral therapy (CBT). This type of therapy teaches you to replace negative thoughts and actions with positive thoughts and actions. When used as part of ADHD treatment, this therapy may also include: Coping strategies for organization, time management, impulse control, and stress reduction. Mindfulness and meditation training. Behavioral management. You may work with a Psychologist, occupational who is specially trained to help people with ADHD manage and organize activities and function more effectively. Follow these instructions at home: Medicines  Take over-the-counter and prescription medicines only as told by your health care provider. Talk with your health care provider about the possible side effects of your medicines and how to manage them.  Lifestyle  Do not use drugs. Do not drink  alcohol if: Your health care provider tells you not  to drink. You are pregnant, may be pregnant, or are planning to become pregnant. If you drink alcohol: Limit how much you use to: 0-1 drink a day for women. 0-2 drinks a day for men. Be aware of how much alcohol is in your drink. In the [place] one drink equals one 12 oz bottle of beer (355 mL), one 5 oz glass of wine (148 mL), or one 1 oz glass of hard liquor (44 mL). Get enough sleep. Eat a healthy diet. Exercise regularly. Exercise can help to reduce stress and anxiety.  General instructions Learn as much as you can about adult ADHD, and work closely with your health care providers to find the treatments that work best for you. Follow the same schedule each day. Use reminder devices like notes, calendars, and phone apps to stay on time and organized. Keep all follow-up visits as told by your health care provider and therapist. This is important. Where to find more information A health care provider may be able to recommend resources that are available online or over the phone. You could start with: Attention Deficit Disorder Association (ADDA): http://davis-dillon.net/ [employer] [facility]): http://www.maynard.net/ Contact a health care provider if: Your symptoms continue to cause problems. You have side effects from your medicine, such as: Repeated muscle twitches, coughing, or speech outbursts. Sleep problems. Loss of appetite. Dizziness. Unusually fast heartbeat. Stomach pains. Headaches. You are struggling with anxiety, depression, or substance abuse. Get help right away if you: Have a severe reaction to a medicine. If you ever feel like you may hurt yourself or others, or have thoughts about taking your own life, get help right away. You can go to the nearest emergency department or call: Your local emergency services (911 in the [place] A suicide crisis helpline, such as the National Suicide Prevention Lifeline at  772-349-6450. This is open 24 hours a day. Summary ADHD is a mental health disorder that starts during childhood (neurodevelopmental disorder) and often continues into the adult years. The exact cause of ADHD is not known. Most experts believe genetics and environmental factors contribute to ADHD. There is no cure for ADHD, but treatment with medicine, cognitive behavioral therapy, or behavioral management can help you manage your condition. This information is not intended to replace advice given to you by your health care provider. Make sure you discuss any questions you have with your healthcare provider. Document Revised: 08/28/2018 Document Reviewed: 08/28/2018 Elsevier Patient Education  2022 [employer].

## 2020-11-10 ENCOUNTER — Other Ambulatory Visit (HOSPITAL_COMMUNITY): Payer: Self-pay

## 2020-11-12 DIAGNOSIS — M9901 Segmental and somatic dysfunction of cervical region: Secondary | ICD-10-CM | POA: Diagnosis not present

## 2020-11-12 DIAGNOSIS — M5383 Other specified dorsopathies, cervicothoracic region: Secondary | ICD-10-CM | POA: Diagnosis not present

## 2020-11-12 DIAGNOSIS — R293 Abnormal posture: Secondary | ICD-10-CM | POA: Diagnosis not present

## 2020-11-12 DIAGNOSIS — M546 Pain in thoracic spine: Secondary | ICD-10-CM | POA: Diagnosis not present

## 2020-11-12 DIAGNOSIS — M9903 Segmental and somatic dysfunction of lumbar region: Secondary | ICD-10-CM | POA: Diagnosis not present

## 2020-11-12 DIAGNOSIS — M9905 Segmental and somatic dysfunction of pelvic region: Secondary | ICD-10-CM | POA: Diagnosis not present

## 2020-11-12 DIAGNOSIS — M9902 Segmental and somatic dysfunction of thoracic region: Secondary | ICD-10-CM | POA: Diagnosis not present

## 2020-11-12 DIAGNOSIS — M5451 Vertebrogenic low back pain: Secondary | ICD-10-CM | POA: Diagnosis not present

## 2020-11-12 DIAGNOSIS — M461 Sacroiliitis, not elsewhere classified: Secondary | ICD-10-CM | POA: Diagnosis not present

## 2020-11-13 ENCOUNTER — Telehealth: Payer: Self-pay

## 2020-11-13 NOTE — Telephone Encounter (Signed)
Prior authorization through [employer], patient's insurance has approved Adderall 10mg  for 11/13/2020 to 11/12/2021.

## 2020-11-14 DIAGNOSIS — M9902 Segmental and somatic dysfunction of thoracic region: Secondary | ICD-10-CM | POA: Diagnosis not present

## 2020-11-14 DIAGNOSIS — M9905 Segmental and somatic dysfunction of pelvic region: Secondary | ICD-10-CM | POA: Diagnosis not present

## 2020-11-14 DIAGNOSIS — M461 Sacroiliitis, not elsewhere classified: Secondary | ICD-10-CM | POA: Diagnosis not present

## 2020-11-14 DIAGNOSIS — M5383 Other specified dorsopathies, cervicothoracic region: Secondary | ICD-10-CM | POA: Diagnosis not present

## 2020-11-14 DIAGNOSIS — Z20828 Contact with and (suspected) exposure to other viral communicable diseases: Secondary | ICD-10-CM | POA: Diagnosis not present

## 2020-11-14 DIAGNOSIS — M546 Pain in thoracic spine: Secondary | ICD-10-CM | POA: Diagnosis not present

## 2020-11-14 DIAGNOSIS — M9903 Segmental and somatic dysfunction of lumbar region: Secondary | ICD-10-CM | POA: Diagnosis not present

## 2020-11-14 DIAGNOSIS — R293 Abnormal posture: Secondary | ICD-10-CM | POA: Diagnosis not present

## 2020-11-14 DIAGNOSIS — R519 Headache, unspecified: Secondary | ICD-10-CM | POA: Diagnosis not present

## 2020-11-14 DIAGNOSIS — M9901 Segmental and somatic dysfunction of cervical region: Secondary | ICD-10-CM | POA: Diagnosis not present

## 2020-11-14 DIAGNOSIS — M5451 Vertebrogenic low back pain: Secondary | ICD-10-CM | POA: Diagnosis not present

## 2020-11-14 DIAGNOSIS — R0982 Postnasal drip: Secondary | ICD-10-CM | POA: Diagnosis not present

## 2020-11-15 ENCOUNTER — Other Ambulatory Visit (HOSPITAL_COMMUNITY): Payer: Self-pay

## 2020-11-16 ENCOUNTER — Telehealth: Payer: 59 | Admitting: Physician Assistant

## 2020-11-16 ENCOUNTER — Encounter: Payer: Self-pay | Admitting: Physician Assistant

## 2020-11-16 DIAGNOSIS — B9689 Other specified bacterial agents as the cause of diseases classified elsewhere: Secondary | ICD-10-CM | POA: Diagnosis not present

## 2020-11-16 DIAGNOSIS — N760 Acute vaginitis: Secondary | ICD-10-CM | POA: Diagnosis not present

## 2020-11-16 MED ORDER — CLINDAMYCIN PHOSPHATE 2 % VA CREA: 1.0000 | TOPICAL_CREAM | Freq: Every day | VAGINAL | 0 refills | Status: DC

## 2020-11-16 NOTE — Progress Notes (Signed)

## 2020-11-17 ENCOUNTER — Other Ambulatory Visit (HOSPITAL_COMMUNITY): Payer: Self-pay

## 2020-11-17 ENCOUNTER — Encounter: Payer: Self-pay | Admitting: Nurse Practitioner

## 2020-11-18 ENCOUNTER — Telehealth: Payer: 59 | Admitting: Physician Assistant

## 2020-11-18 DIAGNOSIS — B9689 Other specified bacterial agents as the cause of diseases classified elsewhere: Secondary | ICD-10-CM

## 2020-11-18 MED ORDER — DOXYCYCLINE HYCLATE 100 MG PO CAPS
100.0000 mg | ORAL_CAPSULE | Freq: Two times a day (BID) | ORAL | 0 refills | Status: DC
Start: 2020-11-18 — End: 2020-11-18

## 2020-11-18 MED ORDER — AZITHROMYCIN 250 MG PO TABS: ORAL_TABLET | ORAL | 0 refills | Status: AC

## 2020-11-18 NOTE — Addendum Note (Signed)
Addended by: Waldon Merl on: 11/18/2020 05:17 PM   Modules accepted: Orders

## 2020-11-18 NOTE — Progress Notes (Signed)
I have spent 5 minutes in review of e-visit questionnaire, review and updating patient chart, medical decision making and response to patient.   Dastan Krider Cody Astin Rape, PA-C    

## 2020-11-18 NOTE — Progress Notes (Signed)

## 2020-11-19 DIAGNOSIS — M9903 Segmental and somatic dysfunction of lumbar region: Secondary | ICD-10-CM | POA: Diagnosis not present

## 2020-11-19 DIAGNOSIS — M9905 Segmental and somatic dysfunction of pelvic region: Secondary | ICD-10-CM | POA: Diagnosis not present

## 2020-11-19 DIAGNOSIS — M9901 Segmental and somatic dysfunction of cervical region: Secondary | ICD-10-CM | POA: Diagnosis not present

## 2020-11-19 DIAGNOSIS — R293 Abnormal posture: Secondary | ICD-10-CM | POA: Diagnosis not present

## 2020-11-19 DIAGNOSIS — M546 Pain in thoracic spine: Secondary | ICD-10-CM | POA: Diagnosis not present

## 2020-11-19 DIAGNOSIS — M5383 Other specified dorsopathies, cervicothoracic region: Secondary | ICD-10-CM | POA: Diagnosis not present

## 2020-11-19 DIAGNOSIS — M9902 Segmental and somatic dysfunction of thoracic region: Secondary | ICD-10-CM | POA: Diagnosis not present

## 2020-11-19 DIAGNOSIS — M5451 Vertebrogenic low back pain: Secondary | ICD-10-CM | POA: Diagnosis not present

## 2020-11-19 DIAGNOSIS — M461 Sacroiliitis, not elsewhere classified: Secondary | ICD-10-CM | POA: Diagnosis not present

## 2020-11-20 DIAGNOSIS — M9902 Segmental and somatic dysfunction of thoracic region: Secondary | ICD-10-CM | POA: Diagnosis not present

## 2020-11-20 DIAGNOSIS — M9905 Segmental and somatic dysfunction of pelvic region: Secondary | ICD-10-CM | POA: Diagnosis not present

## 2020-11-20 DIAGNOSIS — M5383 Other specified dorsopathies, cervicothoracic region: Secondary | ICD-10-CM | POA: Diagnosis not present

## 2020-11-20 DIAGNOSIS — M5451 Vertebrogenic low back pain: Secondary | ICD-10-CM | POA: Diagnosis not present

## 2020-11-20 DIAGNOSIS — R293 Abnormal posture: Secondary | ICD-10-CM | POA: Diagnosis not present

## 2020-11-20 DIAGNOSIS — M9903 Segmental and somatic dysfunction of lumbar region: Secondary | ICD-10-CM | POA: Diagnosis not present

## 2020-11-20 DIAGNOSIS — M461 Sacroiliitis, not elsewhere classified: Secondary | ICD-10-CM | POA: Diagnosis not present

## 2020-11-20 DIAGNOSIS — M546 Pain in thoracic spine: Secondary | ICD-10-CM | POA: Diagnosis not present

## 2020-11-20 DIAGNOSIS — M9901 Segmental and somatic dysfunction of cervical region: Secondary | ICD-10-CM | POA: Diagnosis not present

## 2020-11-24 DIAGNOSIS — M461 Sacroiliitis, not elsewhere classified: Secondary | ICD-10-CM | POA: Diagnosis not present

## 2020-11-24 DIAGNOSIS — M9902 Segmental and somatic dysfunction of thoracic region: Secondary | ICD-10-CM | POA: Diagnosis not present

## 2020-11-24 DIAGNOSIS — M546 Pain in thoracic spine: Secondary | ICD-10-CM | POA: Diagnosis not present

## 2020-11-24 DIAGNOSIS — M5451 Vertebrogenic low back pain: Secondary | ICD-10-CM | POA: Diagnosis not present

## 2020-11-24 DIAGNOSIS — M9903 Segmental and somatic dysfunction of lumbar region: Secondary | ICD-10-CM | POA: Diagnosis not present

## 2020-11-24 DIAGNOSIS — M9905 Segmental and somatic dysfunction of pelvic region: Secondary | ICD-10-CM | POA: Diagnosis not present

## 2020-11-24 DIAGNOSIS — M9901 Segmental and somatic dysfunction of cervical region: Secondary | ICD-10-CM | POA: Diagnosis not present

## 2020-11-24 DIAGNOSIS — M5383 Other specified dorsopathies, cervicothoracic region: Secondary | ICD-10-CM | POA: Diagnosis not present

## 2020-11-24 DIAGNOSIS — R293 Abnormal posture: Secondary | ICD-10-CM | POA: Diagnosis not present

## 2020-11-26 DIAGNOSIS — M546 Pain in thoracic spine: Secondary | ICD-10-CM | POA: Diagnosis not present

## 2020-11-26 DIAGNOSIS — M9902 Segmental and somatic dysfunction of thoracic region: Secondary | ICD-10-CM | POA: Diagnosis not present

## 2020-11-26 DIAGNOSIS — M5451 Vertebrogenic low back pain: Secondary | ICD-10-CM | POA: Diagnosis not present

## 2020-11-26 DIAGNOSIS — M5383 Other specified dorsopathies, cervicothoracic region: Secondary | ICD-10-CM | POA: Diagnosis not present

## 2020-11-26 DIAGNOSIS — M461 Sacroiliitis, not elsewhere classified: Secondary | ICD-10-CM | POA: Diagnosis not present

## 2020-11-26 DIAGNOSIS — R293 Abnormal posture: Secondary | ICD-10-CM | POA: Diagnosis not present

## 2020-11-26 DIAGNOSIS — M9905 Segmental and somatic dysfunction of pelvic region: Secondary | ICD-10-CM | POA: Diagnosis not present

## 2020-11-26 DIAGNOSIS — M9903 Segmental and somatic dysfunction of lumbar region: Secondary | ICD-10-CM | POA: Diagnosis not present

## 2020-11-26 DIAGNOSIS — M9901 Segmental and somatic dysfunction of cervical region: Secondary | ICD-10-CM | POA: Diagnosis not present

## 2020-11-27 ENCOUNTER — Encounter: Payer: Self-pay | Admitting: Nurse Practitioner

## 2020-11-28 ENCOUNTER — Encounter: Payer: Self-pay | Admitting: Nurse Practitioner

## 2020-11-28 ENCOUNTER — Telehealth (INDEPENDENT_AMBULATORY_CARE_PROVIDER_SITE_OTHER): Payer: 59 | Admitting: Nurse Practitioner

## 2020-11-28 VITALS — Ht 63.0 in | Wt 196.0 lb

## 2020-11-28 DIAGNOSIS — R112 Nausea with vomiting, unspecified: Secondary | ICD-10-CM | POA: Diagnosis not present

## 2020-11-28 NOTE — Progress Notes (Signed)
Virtual Visit via Video Note   This visit type was conducted due to national recommendations for restrictions regarding the COVID-19 Pandemic (e.g. social distancing) in an effort to limit this patient's exposure and mitigate transmission in our community.  Due to her co-morbid illnesses, this patient is at least at moderate risk for complications without adequate follow up.  This format is felt to be most appropriate for this patient at this time.  All issues noted in this document were discussed and addressed.  A limited physical exam was performed with this format.  A verbal consent was obtained for the virtual visit.   Date:  11/28/2020   ID:  Kaitlyn Sawyer, DOB [birth year], MRN [id number]  Patient Location: Home Provider Location: Office/Clinic  PCP:  Kaitlyn Morning, NP   Evaluation Performed:  Established patient, acute telemedicine visit  Chief Complaint:  Nausea and vomiting  History of Present Illness:    Kaitlyn Sawyer is a 35 y.o. female with nausea and vomiting. Onset of symptoms was 4-days-ago. Treatment has included Zofran and clear liquid diet. She recently changed anxiety medications. She initially experienced nausea while tapering off of Effexor and beginning Trintellix. States symptoms subsided after a few weeks. She was recently treated with a course of antibiotics for a sinus infections. Denies sinus symptoms currently.  The patient does not have symptoms concerning for COVID-19 infection (fever, chills, cough, or new shortness of breath).    Past Medical History:  Diagnosis Date   Heart murmur    asymptomatic with exception during pregancy notice occ. palpitations   History of abnormal cervical Pap smear    HPV-- S/P CRYOTHERPY   History of gestational hypertension    DUE TO ECLAMPSIA   History of postpartum depression    History of seizure    x1  secondary to preclampsia 2011 (approx)  none since    Past Surgical History:  Procedure Laterality  Date   DILATION AND CURETTAGE OF UTERUS     post partem retained placenta   LAPAROSCOPIC TUBAL LIGATION Bilateral 01/23/2015   Procedure: LAPAROSCOPIC TUBAL LIGATION;  Surgeon: Marcelle Overlie, MD;  Location: [facility];  Service: Gynecology;  Laterality: Bilateral;   uterine ablasion  01/06/2017   WISDOM TOOTH EXTRACTION  age 58    Family History  Problem Relation Age of Onset   Alcohol abuse Maternal Grandfather        liver failure   Heart disease Paternal Grandfather        "massive MI"   Hyperlipidemia Paternal Grandfather    Heart attack Paternal Grandfather    Stroke Paternal Grandmother    Hyperlipidemia Paternal Grandmother    Alcohol abuse Mother     Social History   Socioeconomic History   Marital status: Divorced    Spouse name: Not on file   Number of children: 4   Years of education: Not on file   Highest education level: Not on file  Occupational History   Occupation: Sales executive  Tobacco Use   Smoking status: Former    Packs/day: 0.50    Years: 10.00    Pack years: 5.00    Types: Cigarettes    Quit date: 04/19/2017    Years since quitting: 3.6   Smokeless tobacco: Never  Vaping Use   Vaping Use: Never used  Substance and Sexual Activity   Alcohol use: Yes    Comment: social   Drug use: No   Sexual activity: Yes  Other Topics Concern  Not on file  Social History Narrative   Not on file   Social Determinants of Health   Financial Resource Strain: Not on file  Food Insecurity: Not on file  Transportation Needs: Not on file  Physical Activity: Not on file  Stress: Not on file  Social Connections: Not on file  Intimate Partner Violence: Not on file    Outpatient Medications Prior to Visit  Medication Sig Dispense Refill   amphetamine-dextroamphetamine (ADDERALL) 10 MG tablet Take two tablets (20mg ) by mouth every Sawyer  and one tablet (10 mg) by mouth every afternoon 90 tablet 0   clindamycin (CLEOCIN) 2 % vaginal  cream Place 1 Applicatorful vaginally at bedtime. 40 g 0   ondansetron (ZOFRAN) 4 MG tablet Take 1 tablet (4 mg total) by mouth daily as needed for nausea or vomiting. 30 tablet 1   vortioxetine HBr (TRINTELLIX) 10 MG TABS tablet Take 1 tablet (10 mg total) by mouth daily. 30 tablet 1   vortioxetine HBr (TRINTELLIX) 10 MG TABS tablet Take 1 tablet (10 mg total) by mouth daily. 28 tablet 0   mupirocin ointment (BACTROBAN) 2 % Apply 1 application topically 2 (two) times daily. 22 g 0   No facility-administered medications prior to visit.    Allergies:   Diclegis [doxylamine-pyridoxine] and Penicillins   Social History   Tobacco Use   Smoking status: Former    Packs/day: 0.50    Years: 10.00    Pack years: 5.00    Types: Cigarettes    Quit date: 04/19/2017    Years since quitting: 3.6   Smokeless tobacco: Never  Vaping Use   Vaping Use: Never used  Substance Use Topics   Alcohol use: Yes    Comment: social   Drug use: No     Review of Systems  Constitutional:  Negative for chills, fever and malaise/fatigue.  HENT: Negative.    Eyes: Negative.   Respiratory: Negative.    Cardiovascular: Negative.   Gastrointestinal:  Positive for constipation, heartburn, nausea and vomiting.  Genitourinary: Negative.   Musculoskeletal: Negative.   Skin: Negative.   Neurological: Negative.   Endo/Heme/Allergies: Negative.   Psychiatric/Behavioral: Negative.      Labs/Other Tests and Data Reviewed:    Recent Labs: 09/09/2020: ALT 66; BUN 12; Creatinine, Ser 0.79; Hemoglobin 13.8; Platelets 311; Potassium 4.2; Sodium 139; TSH 0.961   Recent Lipid Panel No results found for: CHOL, TRIG, HDL, CHOLHDL, LDLCALC, LDLDIRECT  Wt Readings from Last 3 Encounters:  11/28/20 196 lb (88.9 kg)  11/05/20 196 lb (88.9 kg)  10/07/20 191 lb (86.6 kg)     Objective:    Vital Signs:  Ht 5\' 3"  (1.6 m)   Wt 196 lb (88.9 kg)   BMI 34.72 kg/m    Physical Exam No physical exam due to telemedicine  visit  ASSESSMENT & PLAN:     1. Nausea and vomiting, intractability of vomiting not specified, unspecified vomiting type - 10/09/20 Abdomen Limited RUQ (LIVER/GB); Future    Continue low-fat bland diet Continue Zofran as needed for nausea We will call you with appointment for Abd Follow-up pending [place] results   COVID-19 Education: The signs and symptoms of COVID-19 were discussed with the patient and how to seek care for testing (follow up with PCP or arrange E-visit). The importance of social distancing was discussed today.   I spent 10 minutes dedicated to the care of this patient on the date of this encounter to include face-to-face time with the  patient, as well as: EMR.   Follow Up:  In Person prn  Signed, Kaitlyn Morning, NP  11/28/2020 8:15 AM    [facility]

## 2020-12-02 ENCOUNTER — Encounter: Payer: Self-pay | Admitting: Nurse Practitioner

## 2020-12-02 ENCOUNTER — Other Ambulatory Visit: Payer: Self-pay

## 2020-12-02 ENCOUNTER — Ambulatory Visit (HOSPITAL_BASED_OUTPATIENT_CLINIC_OR_DEPARTMENT_OTHER)
Admission: RE | Admit: 2020-12-02 | Discharge: 2020-12-02 | Disposition: A | Discharge status: Home / Self Care | Payer: 59 | Source: Ambulatory Visit | Attending: Nurse Practitioner | Admitting: Nurse Practitioner

## 2020-12-02 DIAGNOSIS — M546 Pain in thoracic spine: Secondary | ICD-10-CM | POA: Diagnosis not present

## 2020-12-02 DIAGNOSIS — R1011 Right upper quadrant pain: Secondary | ICD-10-CM | POA: Diagnosis not present

## 2020-12-02 DIAGNOSIS — M5451 Vertebrogenic low back pain: Secondary | ICD-10-CM | POA: Diagnosis not present

## 2020-12-02 DIAGNOSIS — R112 Nausea with vomiting, unspecified: Secondary | ICD-10-CM | POA: Diagnosis not present

## 2020-12-02 DIAGNOSIS — M461 Sacroiliitis, not elsewhere classified: Secondary | ICD-10-CM | POA: Diagnosis not present

## 2020-12-02 DIAGNOSIS — M5383 Other specified dorsopathies, cervicothoracic region: Secondary | ICD-10-CM | POA: Diagnosis not present

## 2020-12-02 DIAGNOSIS — K760 Fatty (change of) liver, not elsewhere classified: Secondary | ICD-10-CM | POA: Diagnosis not present

## 2020-12-02 DIAGNOSIS — M9901 Segmental and somatic dysfunction of cervical region: Secondary | ICD-10-CM | POA: Diagnosis not present

## 2020-12-02 DIAGNOSIS — M9905 Segmental and somatic dysfunction of pelvic region: Secondary | ICD-10-CM | POA: Diagnosis not present

## 2020-12-02 DIAGNOSIS — M9903 Segmental and somatic dysfunction of lumbar region: Secondary | ICD-10-CM | POA: Diagnosis not present

## 2020-12-02 DIAGNOSIS — M9902 Segmental and somatic dysfunction of thoracic region: Secondary | ICD-10-CM | POA: Diagnosis not present

## 2020-12-02 DIAGNOSIS — R293 Abnormal posture: Secondary | ICD-10-CM | POA: Diagnosis not present

## 2020-12-03 ENCOUNTER — Telehealth: Payer: Self-pay | Admitting: Nurse Practitioner

## 2020-12-03 ENCOUNTER — Other Ambulatory Visit: Payer: Self-pay | Admitting: Nurse Practitioner

## 2020-12-03 DIAGNOSIS — Z6834 Body mass index (BMI) 34.0-34.9, adult: Secondary | ICD-10-CM

## 2020-12-03 DIAGNOSIS — K760 Fatty (change of) liver, not elsewhere classified: Secondary | ICD-10-CM

## 2020-12-03 NOTE — Telephone Encounter (Signed)
Pt was called with RUQ [place]. Hepatic steatosis noted. Radiologist noted that hepatic steatosis may impede [place] waves, MRI liver protocol recommended. Pt referred to medical weight management to assist with weight loss. Reassured liver enzymes are normal. Nausea and vomiting have improved. Pt verbalized to proceed with liver MRI protocol.

## 2020-12-04 ENCOUNTER — Other Ambulatory Visit: Payer: Self-pay | Admitting: Nurse Practitioner

## 2020-12-04 ENCOUNTER — Encounter: Payer: Self-pay | Admitting: Nurse Practitioner

## 2020-12-04 DIAGNOSIS — K760 Fatty (change of) liver, not elsewhere classified: Secondary | ICD-10-CM

## 2020-12-05 ENCOUNTER — Other Ambulatory Visit: Payer: Self-pay

## 2020-12-05 DIAGNOSIS — F321 Major depressive disorder, single episode, moderate: Secondary | ICD-10-CM

## 2020-12-05 DIAGNOSIS — F419 Anxiety disorder, unspecified: Secondary | ICD-10-CM

## 2020-12-05 MED ORDER — VORTIOXETINE HBR 10 MG PO TABS: 10.0000 mg | ORAL_TABLET | Freq: Every day | ORAL | 0 refills | Status: DC

## 2020-12-09 DIAGNOSIS — M5383 Other specified dorsopathies, cervicothoracic region: Secondary | ICD-10-CM | POA: Diagnosis not present

## 2020-12-09 DIAGNOSIS — R293 Abnormal posture: Secondary | ICD-10-CM | POA: Diagnosis not present

## 2020-12-09 DIAGNOSIS — M5451 Vertebrogenic low back pain: Secondary | ICD-10-CM | POA: Diagnosis not present

## 2020-12-09 DIAGNOSIS — M546 Pain in thoracic spine: Secondary | ICD-10-CM | POA: Diagnosis not present

## 2020-12-09 DIAGNOSIS — M9902 Segmental and somatic dysfunction of thoracic region: Secondary | ICD-10-CM | POA: Diagnosis not present

## 2020-12-09 DIAGNOSIS — M461 Sacroiliitis, not elsewhere classified: Secondary | ICD-10-CM | POA: Diagnosis not present

## 2020-12-09 DIAGNOSIS — M9903 Segmental and somatic dysfunction of lumbar region: Secondary | ICD-10-CM | POA: Diagnosis not present

## 2020-12-09 DIAGNOSIS — M9901 Segmental and somatic dysfunction of cervical region: Secondary | ICD-10-CM | POA: Diagnosis not present

## 2020-12-09 DIAGNOSIS — M9905 Segmental and somatic dysfunction of pelvic region: Secondary | ICD-10-CM | POA: Diagnosis not present

## 2020-12-10 ENCOUNTER — Other Ambulatory Visit: Payer: Self-pay

## 2020-12-10 ENCOUNTER — Other Ambulatory Visit (HOSPITAL_COMMUNITY): Payer: Self-pay

## 2020-12-10 ENCOUNTER — Ambulatory Visit: Payer: 59

## 2020-12-10 ENCOUNTER — Other Ambulatory Visit: Payer: 59

## 2020-12-10 ENCOUNTER — Telehealth: Payer: 59 | Admitting: Nurse Practitioner

## 2020-12-10 DIAGNOSIS — K760 Fatty (change of) liver, not elsewhere classified: Secondary | ICD-10-CM

## 2020-12-11 LAB — LIPID PANEL
Chol/HDL Ratio: 5.1 ratio — ABNORMAL HIGH (ref 0.0–4.4)
Cholesterol, Total: 154 mg/dL (ref 100–199)
HDL: 30 mg/dL — ABNORMAL LOW (ref >39)
LDL Chol Calc (NIH): 105 mg/dL — ABNORMAL HIGH (ref 0–99)
Triglycerides: 103 mg/dL (ref 0–149)
VLDL Cholesterol Cal: 19 mg/dL (ref 5–40)

## 2020-12-11 LAB — CARDIOVASCULAR RISK ASSESSMENT

## 2020-12-16 ENCOUNTER — Telehealth: Payer: 59 | Admitting: Nurse Practitioner

## 2020-12-16 DIAGNOSIS — M5451 Vertebrogenic low back pain: Secondary | ICD-10-CM | POA: Diagnosis not present

## 2020-12-16 DIAGNOSIS — M9902 Segmental and somatic dysfunction of thoracic region: Secondary | ICD-10-CM | POA: Diagnosis not present

## 2020-12-16 DIAGNOSIS — M9903 Segmental and somatic dysfunction of lumbar region: Secondary | ICD-10-CM | POA: Diagnosis not present

## 2020-12-16 DIAGNOSIS — M9905 Segmental and somatic dysfunction of pelvic region: Secondary | ICD-10-CM | POA: Diagnosis not present

## 2020-12-16 DIAGNOSIS — M9901 Segmental and somatic dysfunction of cervical region: Secondary | ICD-10-CM | POA: Diagnosis not present

## 2020-12-16 DIAGNOSIS — M5383 Other specified dorsopathies, cervicothoracic region: Secondary | ICD-10-CM | POA: Diagnosis not present

## 2020-12-16 DIAGNOSIS — R293 Abnormal posture: Secondary | ICD-10-CM | POA: Diagnosis not present

## 2020-12-16 DIAGNOSIS — M546 Pain in thoracic spine: Secondary | ICD-10-CM | POA: Diagnosis not present

## 2020-12-16 DIAGNOSIS — M461 Sacroiliitis, not elsewhere classified: Secondary | ICD-10-CM | POA: Diagnosis not present

## 2020-12-18 ENCOUNTER — Encounter: Payer: Self-pay | Admitting: Nurse Practitioner

## 2020-12-18 ENCOUNTER — Other Ambulatory Visit: Payer: Self-pay

## 2020-12-18 ENCOUNTER — Other Ambulatory Visit: Payer: Self-pay | Admitting: Nurse Practitioner

## 2020-12-18 ENCOUNTER — Ambulatory Visit (INDEPENDENT_AMBULATORY_CARE_PROVIDER_SITE_OTHER): Payer: 59 | Admitting: Nurse Practitioner

## 2020-12-18 ENCOUNTER — Other Ambulatory Visit (HOSPITAL_COMMUNITY): Payer: Self-pay

## 2020-12-18 VITALS — BP 122/78 | HR 89 | Temp 97.4°F | Ht 63.0 in | Wt 188.8 lb

## 2020-12-18 DIAGNOSIS — Z6833 Body mass index (BMI) 33.0-33.9, adult: Secondary | ICD-10-CM

## 2020-12-18 DIAGNOSIS — Z7689 Persons encountering health services in other specified circumstances: Secondary | ICD-10-CM

## 2020-12-18 DIAGNOSIS — E6609 Other obesity due to excess calories: Secondary | ICD-10-CM

## 2020-12-18 DIAGNOSIS — K760 Fatty (change of) liver, not elsewhere classified: Secondary | ICD-10-CM | POA: Diagnosis not present

## 2020-12-18 DIAGNOSIS — E782 Mixed hyperlipidemia: Secondary | ICD-10-CM | POA: Diagnosis not present

## 2020-12-18 MED ORDER — SEMAGLUTIDE-WEIGHT MANAGEMENT 1 MG/0.5ML ~~LOC~~ SOAJ
1.0000 mg | SUBCUTANEOUS | 0 refills | Status: DC
  Filled 2020-12-18: qty 2, 28d supply, fill #0

## 2020-12-18 MED ORDER — OZEMPIC (0.25 OR 0.5 MG/DOSE) 2 MG/1.5ML ~~LOC~~ SOPN
PEN_INJECTOR | SUBCUTANEOUS | 1 refills | Status: DC
  Filled 2020-12-18: qty 1.5, 42d supply, fill #0
  Filled 2021-01-14: qty 1.5, 28d supply, fill #1

## 2020-12-18 MED ORDER — SEMAGLUTIDE-WEIGHT MANAGEMENT 1.7 MG/0.75ML ~~LOC~~ SOAJ
1.7000 mg | SUBCUTANEOUS | 0 refills | Status: DC
Start: 2021-03-15 — End: 2020-12-18
  Filled 2020-12-18: qty 3, 28d supply, fill #0

## 2020-12-18 MED ORDER — SEMAGLUTIDE-WEIGHT MANAGEMENT 2.4 MG/0.75ML ~~LOC~~ SOAJ
2.4000 mg | SUBCUTANEOUS | 1 refills | Status: DC
  Filled 2020-12-18: qty 3, 28d supply, fill #0

## 2020-12-18 MED ORDER — OZEMPIC (0.25 OR 0.5 MG/DOSE) 2 MG/1.5ML ~~LOC~~ SOPN
0.5000 mg | PEN_INJECTOR | SUBCUTANEOUS | 1 refills | Status: DC
  Filled 2020-12-18: qty 1.5, 28d supply, fill #0

## 2020-12-18 MED ORDER — SEMAGLUTIDE-WEIGHT MANAGEMENT 0.5 MG/0.5ML ~~LOC~~ SOAJ
0.5000 mg | SUBCUTANEOUS | 0 refills | Status: DC
  Filled 2020-12-18: qty 2, 28d supply, fill #0

## 2020-12-18 MED ORDER — SEMAGLUTIDE-WEIGHT MANAGEMENT 0.25 MG/0.5ML ~~LOC~~ SOAJ
0.2500 mg | SUBCUTANEOUS | 1 refills | Status: DC
Start: 2020-12-18 — End: 2020-12-18
  Filled 2020-12-18: qty 2, fill #0

## 2020-12-18 NOTE — Patient Instructions (Signed)
Begin Wegovy 0.25 mg injection weekly for 4-weeks Then increase to 0.5 mg weekly for 4-weeks Then increase to 1mg  weekly for 4-weeks Then increase to 1.7 mg for 4-weeks Then increase to 2.4 mg for 4-weeks Follow-up in 8 weeks  How to Increase Your Level of Physical Activity Getting regular physical activity is important for your overall health and well-being. Most people do not get enough exercise. There are easy ways to increase your level of physical activity, even if you have not been very active in the past or if you are just starting out. What are the benefits of physical activity? Physical activity has many short-term and long-term benefits. Being active on a regular basis can improve your physical and mental health as well as provide other benefits. Physical health benefits Helping you lose weight or maintain a healthy weight. Strengthening your muscles and bones. Reducing your risk of certain long-term (chronic) diseases, including heart disease, cancer, and diabetes. Being able to move around more easily and for longer periods of time without getting tired (increased endurance or stamina). Improving your ability to fight off illness (enhanced immunity). Being able to sleep better. Helping you stay healthy as you get older, including: Helping you stay mobile, or capable of walking and moving around. Preventing accidents, such as falls. Increasing life expectancy. Mental health benefits Boosting your mood and improving your self-esteem. Lowering your chance of having mental health problems, such as depression or anxiety. Helping you feel good about your body. Other benefits Finding new sources of fun and enjoyment. Meeting new people who share a common interest. Before you begin If you have a chronic illness or have not been active for a while, check with your health care provider about how to get started. Ask your health care provider what activities are safe for you. Start out  slowly. Walking or doing some simple chair exercises is a good place to start, especially if you have not been active before or for a long time. Set goals that you can work toward. Ask your health care provider how much exercise is best for you. In general, most adults should: Do moderate-intensity exercise for at least 150 minutes each week (30 minutes on most days of the week) or vigorous exercise for at least 75 minutes each week, or a combination of these. Moderate-intensity exercise can include walking at a quick pace, biking, yoga, water aerobics, or gardening. Vigorous exercise involves activities that take more effort, such as jogging or running, playing sports, swimming laps, or jumping rope. Do strength exercises on at least 2 days each week. This can include weight lifting, body weight exercises, and resistance-band exercises. How to be more physically active Make a plan  Try to find activities that you enjoy. You are more likely to commit to an exercise routine if it does not feel like a chore. If you have bone or joint problems, choose low-impact exercises, like walking or swimming. Use these tips for being successful with an exercise plan: Find a workout partner for accountability. Join a group or class, such as an aerobics class, cycling class, or sports team. Make family time active. Go for a walk, bike, or swim. Include a variety of exercises each week. Consider using a fitness tracker, such as a mobile phone app or a device worn like a watch, that will count the number of steps you take each day. Many people strive to reach 10,000 steps a day. Find ways to be active in your daily routines Besides  your formal exercise plans, you can find ways to do physical activity during your daily routines, such as: Walking or biking to work or to the store. Taking the stairs instead of the elevator. Parking farther away from the door at work or at the store. Planning walking  meetings. Walking around while you are on the phone. Where to find more information Centers for Disease Control and Prevention: CampusCasting.com.ptwww.cdc.gov/physicalactivity President's Council on Fitness, Sports & Nutrition: www.fitness.gov ChooseMyPlate: http://www.harvey.com/www.myplate.gov Contact a health care provider if: You have headaches, muscle aches, or joint pain that is concerning. You feel dizzy or light-headed while exercising. You faint. You feel your heart skipping, racing, or fluttering. You have chest pain while exercising. Summary Exercise benefits your mind and body at any age, even if you are just starting out. If you have a chronic illness or have not been active for a while, check with your health care provider before increasing your physical activity. Choose activities that are safe and enjoyable for you. Ask your health care provider what activities are safe for you. Start slowly. Tell your health care provider if you have problems as you start to increase your activity level. This information is not intended to replace advice given to you by your health care provider. Make sure you discuss any questions you have with your health care provider. Document Revised: 08/01/2020 Document Reviewed: 08/01/2020 Elsevier Patient Education  2022 Elsevier Inc. Calorie Counting for Edison InternationalWeight Loss Calories are units of energy. Your body needs a certain number of calories from food to keep going throughout the day. When you eat or drink more calories than your body needs, your body stores the extra calories mostly as fat. When you eat or drink fewer calories than your body needs, your body burns fat to get the energy it needs. Calorie counting means keeping track of how many calories you eat and drink each day. Calorie counting can be helpful if you need to lose weight. If you eat fewer calories than your body needs, you should lose weight. Ask your health care provider what a healthy weight is for you. For calorie  counting to work, you will need to eat the right number of calories each day to lose a healthy amount of weight per week. A dietitian can help you figure out how many calories you need in a day and will suggest ways to reach your calorie goal. A healthy amount of weight to lose each week is usually 1-2 lb (0.5-0.9 kg). This usually means that your daily calorie intake should be reduced by 500-750 calories. Eating 1,200-1,500 calories a day can help most women lose weight. Eating 1,500-1,800 calories a day can help most men lose weight. What do I need to know about calorie counting? Work with your health care provider or dietitian to determine how many calories you should get each day. To meet your daily calorie goal, you will need to: Find out how many calories are in each food that you would like to eat. Try to do this before you eat. Decide how much of the food you plan to eat. Keep a food log. Do this by writing down what you ate and how many calories it had. To successfully lose weight, it is important to balance calorie counting with a healthy lifestyle that includes regular activity. Where do I find calorie information? The number of calories in a food can be found on a Nutrition Facts label. If a food does not have a Nutrition Facts label,  try to look up the calories online or ask your dietitian for help. Remember that calories are listed per serving. If you choose to have more than one serving of a food, you will have to multiply the calories per serving by the number of servings you plan to eat. For example, the label on a package of bread might say that a serving size is 1 slice and that there are 90 calories in a serving. If you eat 1 slice, you will have eaten 90 calories. If you eat 2 slices, you will have eaten 180 calories. How do I keep a food log? After each time that you eat, record the following in your food log as soon as possible: What you ate. Be sure to include toppings, sauces,  and other extras on the food. How much you ate. This can be measured in cups, ounces, or number of items. How many calories were in each food and drink. The total number of calories in the food you ate. Keep your food log near you, such as in a pocket-sized notebook or on an app or website on your mobile phone. Some programs will calculate calories for you and show you how many calories you have left to meet your daily goal. What are some portion-control tips? Know how many calories are in a serving. This will help you know how many servings you can have of a certain food. Use a measuring cup to measure serving sizes. You could also try weighing out portions on a kitchen scale. With time, you will be able to estimate serving sizes for some foods. Take time to put servings of different foods on your favorite plates or in your favorite bowls and cups so you know what a serving looks like. Try not to eat straight from a food's packaging, such as from a bag or box. Eating straight from the package makes it hard to see how much you are eating and can lead to overeating. Put the amount you would like to eat in a cup or on a plate to make sure you are eating the right portion. Use smaller plates, glasses, and bowls for smaller portions and to prevent overeating. Try not to multitask. For example, avoid watching TV or using your computer while eating. If it is time to eat, sit down at a table and enjoy your food. This will help you recognize when you are full. It will also help you be more mindful of what and how much you are eating. What are tips for following this plan? Reading food labels Check the calorie count compared with the serving size. The serving size may be smaller than what you are used to eating. Check the source of the calories. Try to choose foods that are high in protein, fiber, and vitamins, and low in saturated fat, trans fat, and sodium. Shopping Read nutrition labels while you shop.  This will help you make healthy decisions about which foods to buy. Pay attention to nutrition labels for low-fat or fat-free foods. These foods sometimes have the same number of calories or more calories than the full-fat versions. They also often have added sugar, starch, or salt to make up for flavor that was removed with the fat. Make a grocery list of lower-calorie foods and stick to it. Cooking Try to cook your favorite foods in a healthier way. For example, try baking instead of frying. Use low-fat dairy products. Meal planning Use more fruits and vegetables. One-half of your plate  should be fruits and vegetables. Include lean proteins, such as chicken, [place], and fish. Lifestyle Each week, aim to do one of the following: 150 minutes of moderate exercise, such as walking. 75 minutes of vigorous exercise, such as running. General information Know how many calories are in the foods you eat most often. This will help you calculate calorie counts faster. Find a way of tracking calories that works for you. Get creative. Try different apps or programs if writing down calories does not work for you. What foods should I eat?  Eat nutritious foods. It is better to have a nutritious, high-calorie food, such as an avocado, than a food with few nutrients, such as a bag of potato chips. Use your calories on foods and drinks that will fill you up and will not leave you hungry soon after eating. Examples of foods that fill you up are nuts and nut butters, vegetables, lean proteins, and high-fiber foods such as whole grains. High-fiber foods are foods with more than 5 g of fiber per serving. Pay attention to calories in drinks. Low-calorie drinks include water and unsweetened drinks. The items listed above may not be a complete list of foods and beverages you can eat. Contact a dietitian for more information. What foods should I limit? Limit foods or drinks that are not good sources of vitamins,  minerals, or protein or that are high in unhealthy fats. These include: Candy. Other sweets. Sodas, specialty coffee drinks, alcohol, and juice. The items listed above may not be a complete list of foods and beverages you should avoid. Contact a dietitian for more information. How do I count calories when eating out? Pay attention to portions. Often, portions are much larger when eating out. Try these tips to keep portions smaller: Consider sharing a meal instead of getting your own. If you get your own meal, eat only half of it. Before you start eating, ask for a container and put half of your meal into it. When available, consider ordering smaller portions from the menu instead of full portions. Pay attention to your food and drink choices. Knowing the way food is cooked and what is included with the meal can help you eat fewer calories. If calories are listed on the menu, choose the lower-calorie options. Choose dishes that include vegetables, fruits, whole grains, low-fat dairy products, and lean proteins. Choose items that are boiled, broiled, grilled, or steamed. Avoid items that are buttered, battered, fried, or served with cream sauce. Items labeled as crispy are usually fried, unless stated otherwise. Choose water, low-fat milk, unsweetened iced tea, or other drinks without added sugar. If you want an alcoholic beverage, choose a lower-calorie option, such as a glass of wine or light beer. Ask for dressings, sauces, and syrups on the side. These are usually high in calories, so you should limit the amount you eat. If you want a salad, choose a garden salad and ask for grilled meats. Avoid extra toppings such as bacon, cheese, or fried items. Ask for the dressing on the side, or ask for olive oil and vinegar or lemon to use as dressing. Estimate how many servings of a food you are given. Knowing serving sizes will help you be aware of how much food you are eating at restaurants. Where to  find more information Centers for Disease Control and Prevention: FootballExhibition.com.br U.S. Department of Agriculture: WrestlingReporter.dk Summary Calorie counting means keeping track of how many calories you eat and drink each day. If you eat fewer  calories than your body needs, you should lose weight. A healthy amount of weight to lose per week is usually 1-2 lb (0.5-0.9 kg). This usually means reducing your daily calorie intake by 500-750 calories. The number of calories in a food can be found on a Nutrition Facts label. If a food does not have a Nutrition Facts label, try to look up the calories online or ask your dietitian for help. Use smaller plates, glasses, and bowls for smaller portions and to prevent overeating. Use your calories on foods and drinks that will fill you up and not leave you hungry shortly after a meal. This information is not intended to replace advice given to you by your health care provider. Make sure you discuss any questions you have with your health care provider. Document Revised: 05/17/2019 Document Reviewed: 05/17/2019 Elsevier Patient Education  2022 [employer].

## 2020-12-18 NOTE — Progress Notes (Signed)
Subjective:  Patient ID: Kaitlyn Sawyer, female    DOB: 02-13-1986  Age: 35 y.o. MRN: [id number]  Chief Complaint  Patient presents with   Weight Loss    HPI  Kaitlyn Sawyer is a 35 year old Caucasian female that presents for discussion of weight management. Current BMI 33.44. She has tried and failed Phentermine in the past to assist with weight loss. States she wants to lose weight to improve overall health. She was recently diagnosed with hyperlipidemia and hepatic steatosis. She tells me that her weight gain has affected her self-body image. States she feels fatigued and too tired to exercise currently. She has depression.  Current Outpatient Medications on File Prior to Visit  Medication Sig Dispense Refill   amphetamine-dextroamphetamine (ADDERALL) 10 MG tablet Take two tablets (20mg ) by mouth every morning  and one tablet (10 mg) by mouth every afternoon 90 tablet 0   Boric Acid Vaginal 600 MG SUPP compounded medication  BORIC ACID 600MG  CAP  INSERT 1 PV PRN IRRITATION.     ondansetron (ZOFRAN) 4 MG tablet Take 1 tablet (4 mg total) by mouth daily as needed for nausea or vomiting. 30 tablet 1   vortioxetine HBr (TRINTELLIX) 10 MG TABS tablet Take 1 tablet (10 mg total) by mouth daily. 28 tablet 0   No current facility-administered medications on file prior to visit.   Past Medical History:  Diagnosis Date   Heart murmur    asymptomatic with exception during pregancy notice occ. palpitations   History of abnormal cervical Pap smear    HPV-- S/P CRYOTHERPY   History of gestational hypertension    DUE TO ECLAMPSIA   History of postpartum depression    History of seizure    x1  secondary to preclampsia 2011 (approx)  none since   Past Surgical History:  Procedure Laterality Date   DILATION AND CURETTAGE OF UTERUS     post partem retained placenta   LAPAROSCOPIC TUBAL LIGATION Bilateral 01/23/2015   Procedure: LAPAROSCOPIC TUBAL LIGATION;  Surgeon: 2012, MD;  Location:  [facility] [place];  Service: Gynecology;  Laterality: Bilateral;   uterine ablasion  01/06/2017   WISDOM TOOTH EXTRACTION  age 35    Family History  Problem Relation Age of Onset   Alcohol abuse Maternal Grandfather        liver failure   Heart disease Paternal Grandfather        "massive MI"   Hyperlipidemia Paternal Grandfather    Heart attack Paternal Grandfather    Stroke Paternal Grandmother    Hyperlipidemia Paternal Grandmother    Alcohol abuse Mother    Social History   Socioeconomic History   Marital status: Divorced    Spouse name: Not on file   Number of children: 4   Years of education: Not on file   Highest education level: Not on file  Occupational History   Occupation: 01/08/2017  Tobacco Use   Smoking status: Former    Packs/day: 0.50    Years: 10.00    Pack years: 5.00    Types: Cigarettes    Quit date: 04/19/2017    Years since quitting: 3.6   Smokeless tobacco: Never  Vaping Use   Vaping Use: Never used  Substance and Sexual Activity   Alcohol use: Yes    Comment: social   Drug use: No   Sexual activity: Yes  Other Topics Concern   Not on file  Social History Narrative   Not on file   Social  Determinants of Health   Financial Resource Strain: Not on file  Food Insecurity: Not on file  Transportation Needs: Not on file  Physical Activity: Not on file  Stress: Not on file  Social Connections: Not on file    Review of Systems  Constitutional:  Positive for fatigue and unexpected weight change (weight gain). Negative for appetite change and fever.  HENT:  Negative for congestion, ear pain, sinus pressure and sore throat.   Eyes:  Negative for pain.  Respiratory:  Negative for cough, chest tightness, shortness of breath and wheezing.   Cardiovascular:  Negative for chest pain and palpitations.  Gastrointestinal:  Negative for abdominal pain, constipation, diarrhea, nausea and vomiting.  Genitourinary:  Negative for dysuria and  hematuria.  Musculoskeletal:  Negative for arthralgias, back pain, joint swelling and myalgias.  Skin:  Negative for rash.  Neurological:  Negative for dizziness, weakness and headaches.  Psychiatric/Behavioral:  Negative for dysphoric mood. The patient is not nervous/anxious.     Objective:  BP 122/78 (BP Location: Left Arm, Patient Position: Sitting)   Pulse 89   Temp (!) 97.4 F (36.3 C) (Temporal)   Ht 5\' 3"  (1.6 m)   Wt 188 lb 12.8 oz (85.6 kg)   SpO2 99%   BMI 33.44 kg/m   BP/Weight 12/18/2020 11/28/2020 11/05/2020  Systolic BP 122 - 132  Diastolic BP 78 - 68  Wt. (Lbs) 188.8 196 196  BMI 33.44 34.72 34.72    Physical Exam Vitals reviewed.  Constitutional:      Appearance: Normal appearance.  Skin:    General: Skin is warm and dry.     Capillary Refill: Capillary refill takes less than 2 seconds.  Neurological:     General: No focal deficit present.     Mental Status: She is alert and oriented to person, place, and time.  Psychiatric:        Mood and Affect: Mood normal.        Behavior: Behavior normal.        Lab Results  Component Value Date   WBC 8.4 09/09/2020   HGB 13.8 09/09/2020   HCT 40.3 09/09/2020   PLT 311 09/09/2020   GLUCOSE 103 (H) 09/09/2020   CHOL 154 12/10/2020   TRIG 103 12/10/2020   HDL 30 (L) 12/10/2020   LDLCALC 105 (H) 12/10/2020   ALT 66 (H) 09/09/2020   AST 38 09/09/2020   NA 139 09/09/2020   K 4.2 09/09/2020   CL 100 09/09/2020   CREATININE 0.79 09/09/2020   BUN 12 09/09/2020   CO2 22 09/09/2020   TSH 0.961 09/09/2020      Assessment & Plan:     1. Encounter for weight management - Semaglutide,0.25 or 0.5MG /DOS, (OZEMPIC, 0.25 OR 0.5 MG/DOSE,) 2 MG/1.5ML SOPN; Inject 0.25 mg into skin once weekly for four weeks, then increase to 0.5 mg weekly for four weeks  Dispense: 1.5 mL; Refill: 1  2. BMI 33.0-33.9,adult - Semaglutide,0.25 or 0.5MG /DOS, (OZEMPIC, 0.25 OR 0.5 MG/DOSE,) 2 MG/1.5ML SOPN; Inject 0.25 mg into skin  once weekly for four weeks, then increase to 0.5 mg weekly for four weeks  Dispense: 1.5 mL; Refill: 1  3. Mixed hyperlipidemia - Semaglutide,0.25 or 0.5MG /DOS, (OZEMPIC, 0.25 OR 0.5 MG/DOSE,) 2 MG/1.5ML SOPN; Inject 0.25 mg into skin once weekly for four weeks, then increase to 0.5 mg weekly for four weeks  Dispense: 1.5 mL; Refill: 1  4. Hepatic steatosis - Semaglutide,0.25 or 0.5MG /DOS, (OZEMPIC, 0.25 OR 0.5 MG/DOSE,) 2 MG/1.5ML  SOPN; Inject 0.25 mg into skin once weekly for four weeks, then increase to 0.5 mg weekly for four weeks  Dispense: 1.5 mL; Refill: 1  5. Class 1 obesity due to excess calories with serious comorbidity and body mass index (BMI) of 33.0 to 33.9 in adult - Semaglutide,0.25 or 0.5MG /DOS, (OZEMPIC, 0.25 OR 0.5 MG/DOSE,) 2 MG/1.5ML SOPN; Inject 0.25 mg into skin once weekly for four weeks, then increase to 0.5 mg weekly for four weeks  Dispense: 1.5 mL; Refill: 1   Begin Wegovy 0.25 mg injection weekly for 4-weeks Then increase to 0.5 mg weekly for 4-weeks Then increase to 1mg  weekly for 4-weeks Then increase to 1.7 mg for 4-weeks Then increase to 2.4 mg for 4-weeks Follow-up in 8 weeks    Follow-up: 8-weeks  An After Visit Summary was printed and given to the patient.   I,Lauren M Auman,acting as a for Neurosurgeon, NP.,have documented all relevant documentation on the behalf of [employer], NP,as directed by  Janie Morning, NP while in the presence of Janie Morning, NP.   I, Janie Morning, NP, have reviewed all documentation for this visit. The documentation on 12/18/20 for the exam, diagnosis, procedures, and orders are all accurate and complete.   02/17/21, NP [facility] [phone]

## 2020-12-19 ENCOUNTER — Other Ambulatory Visit (HOSPITAL_COMMUNITY): Payer: Self-pay

## 2020-12-25 ENCOUNTER — Encounter: Payer: Self-pay | Admitting: Nurse Practitioner

## 2020-12-29 ENCOUNTER — Other Ambulatory Visit: Payer: Self-pay | Admitting: Nurse Practitioner

## 2020-12-29 DIAGNOSIS — F909 Attention-deficit hyperactivity disorder, unspecified type: Secondary | ICD-10-CM

## 2020-12-29 MED ORDER — AMPHETAMINE-DEXTROAMPHETAMINE 10 MG PO TABS
20.0000 mg | ORAL_TABLET | Freq: Two times a day (BID) | ORAL | 0 refills | Status: DC
Start: 2020-12-29 — End: 2021-02-02
  Filled 2020-12-29: qty 90, 30d supply, fill #0

## 2020-12-30 ENCOUNTER — Other Ambulatory Visit (HOSPITAL_COMMUNITY): Payer: Self-pay

## 2021-01-06 DIAGNOSIS — M9905 Segmental and somatic dysfunction of pelvic region: Secondary | ICD-10-CM | POA: Diagnosis not present

## 2021-01-06 DIAGNOSIS — M461 Sacroiliitis, not elsewhere classified: Secondary | ICD-10-CM | POA: Diagnosis not present

## 2021-01-06 DIAGNOSIS — M546 Pain in thoracic spine: Secondary | ICD-10-CM | POA: Diagnosis not present

## 2021-01-06 DIAGNOSIS — M5451 Vertebrogenic low back pain: Secondary | ICD-10-CM | POA: Diagnosis not present

## 2021-01-06 DIAGNOSIS — M9901 Segmental and somatic dysfunction of cervical region: Secondary | ICD-10-CM | POA: Diagnosis not present

## 2021-01-06 DIAGNOSIS — M9903 Segmental and somatic dysfunction of lumbar region: Secondary | ICD-10-CM | POA: Diagnosis not present

## 2021-01-06 DIAGNOSIS — R293 Abnormal posture: Secondary | ICD-10-CM | POA: Diagnosis not present

## 2021-01-06 DIAGNOSIS — M5383 Other specified dorsopathies, cervicothoracic region: Secondary | ICD-10-CM | POA: Diagnosis not present

## 2021-01-06 DIAGNOSIS — M9902 Segmental and somatic dysfunction of thoracic region: Secondary | ICD-10-CM | POA: Diagnosis not present

## 2021-01-13 ENCOUNTER — Ambulatory Visit: Payer: 59 | Admitting: Nurse Practitioner

## 2021-01-14 ENCOUNTER — Other Ambulatory Visit (HOSPITAL_COMMUNITY): Payer: Self-pay

## 2021-01-14 ENCOUNTER — Other Ambulatory Visit: Payer: Self-pay | Admitting: Nurse Practitioner

## 2021-01-14 ENCOUNTER — Encounter: Payer: Self-pay | Admitting: Nurse Practitioner

## 2021-01-14 DIAGNOSIS — F419 Anxiety disorder, unspecified: Secondary | ICD-10-CM

## 2021-01-14 DIAGNOSIS — F321 Major depressive disorder, single episode, moderate: Secondary | ICD-10-CM

## 2021-01-14 MED ORDER — VORTIOXETINE HBR 10 MG PO TABS
10.0000 mg | ORAL_TABLET | Freq: Every day | ORAL | 1 refills | Status: DC
  Filled 2021-01-14: qty 30, 30d supply, fill #0
  Filled 2021-03-06: qty 30, 30d supply, fill #1

## 2021-01-15 ENCOUNTER — Encounter: Payer: Self-pay | Admitting: Nurse Practitioner

## 2021-01-15 ENCOUNTER — Other Ambulatory Visit (HOSPITAL_COMMUNITY): Payer: Self-pay

## 2021-01-15 ENCOUNTER — Other Ambulatory Visit: Payer: Self-pay | Admitting: Nurse Practitioner

## 2021-01-15 DIAGNOSIS — F419 Anxiety disorder, unspecified: Secondary | ICD-10-CM

## 2021-01-15 DIAGNOSIS — F321 Major depressive disorder, single episode, moderate: Secondary | ICD-10-CM

## 2021-01-15 MED ORDER — VORTIOXETINE HBR 10 MG PO TABS
10.0000 mg | ORAL_TABLET | Freq: Every day | ORAL | 1 refills | Status: DC
  Filled 2021-01-15: qty 90, 90d supply, fill #0

## 2021-01-20 ENCOUNTER — Other Ambulatory Visit (HOSPITAL_COMMUNITY): Payer: Self-pay

## 2021-02-02 ENCOUNTER — Other Ambulatory Visit (HOSPITAL_COMMUNITY): Payer: Self-pay

## 2021-02-02 ENCOUNTER — Other Ambulatory Visit: Payer: Self-pay | Admitting: Nurse Practitioner

## 2021-02-02 DIAGNOSIS — F909 Attention-deficit hyperactivity disorder, unspecified type: Secondary | ICD-10-CM

## 2021-02-02 MED ORDER — AMPHETAMINE-DEXTROAMPHETAMINE 10 MG PO TABS
20.0000 mg | ORAL_TABLET | Freq: Two times a day (BID) | ORAL | 0 refills | Status: DC
  Filled 2021-02-02: qty 90, 30d supply, fill #0

## 2021-02-03 DIAGNOSIS — R293 Abnormal posture: Secondary | ICD-10-CM | POA: Diagnosis not present

## 2021-02-03 DIAGNOSIS — M5451 Vertebrogenic low back pain: Secondary | ICD-10-CM | POA: Diagnosis not present

## 2021-02-03 DIAGNOSIS — M546 Pain in thoracic spine: Secondary | ICD-10-CM | POA: Diagnosis not present

## 2021-02-03 DIAGNOSIS — M9905 Segmental and somatic dysfunction of pelvic region: Secondary | ICD-10-CM | POA: Diagnosis not present

## 2021-02-03 DIAGNOSIS — M9903 Segmental and somatic dysfunction of lumbar region: Secondary | ICD-10-CM | POA: Diagnosis not present

## 2021-02-03 DIAGNOSIS — M461 Sacroiliitis, not elsewhere classified: Secondary | ICD-10-CM | POA: Diagnosis not present

## 2021-02-03 DIAGNOSIS — M9901 Segmental and somatic dysfunction of cervical region: Secondary | ICD-10-CM | POA: Diagnosis not present

## 2021-02-03 DIAGNOSIS — M9902 Segmental and somatic dysfunction of thoracic region: Secondary | ICD-10-CM | POA: Diagnosis not present

## 2021-02-03 DIAGNOSIS — M5383 Other specified dorsopathies, cervicothoracic region: Secondary | ICD-10-CM | POA: Diagnosis not present

## 2021-02-19 ENCOUNTER — Other Ambulatory Visit (HOSPITAL_COMMUNITY): Payer: Self-pay

## 2021-02-19 ENCOUNTER — Other Ambulatory Visit: Payer: Self-pay | Admitting: Nurse Practitioner

## 2021-02-19 DIAGNOSIS — E6609 Other obesity due to excess calories: Secondary | ICD-10-CM

## 2021-02-19 DIAGNOSIS — E782 Mixed hyperlipidemia: Secondary | ICD-10-CM

## 2021-02-19 DIAGNOSIS — Z7689 Persons encountering health services in other specified circumstances: Secondary | ICD-10-CM

## 2021-02-19 DIAGNOSIS — K760 Fatty (change of) liver, not elsewhere classified: Secondary | ICD-10-CM

## 2021-02-19 DIAGNOSIS — Z6833 Body mass index (BMI) 33.0-33.9, adult: Secondary | ICD-10-CM

## 2021-02-19 MED ORDER — OZEMPIC (0.25 OR 0.5 MG/DOSE) 2 MG/1.5ML ~~LOC~~ SOPN
PEN_INJECTOR | SUBCUTANEOUS | 1 refills | Status: DC
  Filled 2021-02-19: qty 1.5, 28d supply, fill #0

## 2021-02-20 ENCOUNTER — Other Ambulatory Visit (HOSPITAL_COMMUNITY): Payer: Self-pay

## 2021-02-20 ENCOUNTER — Other Ambulatory Visit: Payer: Self-pay | Admitting: Nurse Practitioner

## 2021-02-20 ENCOUNTER — Encounter: Payer: Self-pay | Admitting: Nurse Practitioner

## 2021-02-20 DIAGNOSIS — E6609 Other obesity due to excess calories: Secondary | ICD-10-CM

## 2021-02-20 DIAGNOSIS — Z6833 Body mass index (BMI) 33.0-33.9, adult: Secondary | ICD-10-CM

## 2021-02-20 DIAGNOSIS — Z7689 Persons encountering health services in other specified circumstances: Secondary | ICD-10-CM

## 2021-02-20 MED ORDER — OZEMPIC (1 MG/DOSE) 4 MG/3ML ~~LOC~~ SOPN
1.0000 mg | PEN_INJECTOR | SUBCUTANEOUS | 0 refills | Status: DC
  Filled 2021-02-20: qty 3, 28d supply, fill #0

## 2021-02-21 ENCOUNTER — Other Ambulatory Visit (HOSPITAL_COMMUNITY): Payer: Self-pay

## 2021-02-26 ENCOUNTER — Other Ambulatory Visit: Payer: Self-pay | Admitting: Nurse Practitioner

## 2021-02-26 ENCOUNTER — Other Ambulatory Visit (HOSPITAL_COMMUNITY): Payer: Self-pay

## 2021-02-26 DIAGNOSIS — F909 Attention-deficit hyperactivity disorder, unspecified type: Secondary | ICD-10-CM

## 2021-02-26 MED ORDER — AMPHETAMINE-DEXTROAMPHETAMINE 10 MG PO TABS
20.0000 mg | ORAL_TABLET | Freq: Two times a day (BID) | ORAL | 0 refills | Status: DC
  Filled 2021-02-26 – 2021-03-02 (×2): qty 90, 30d supply, fill #0

## 2021-02-27 ENCOUNTER — Other Ambulatory Visit (HOSPITAL_COMMUNITY): Payer: Self-pay

## 2021-03-02 ENCOUNTER — Telehealth: Payer: 59 | Admitting: Physician Assistant

## 2021-03-02 ENCOUNTER — Other Ambulatory Visit (HOSPITAL_COMMUNITY): Payer: Self-pay

## 2021-03-02 DIAGNOSIS — H1033 Unspecified acute conjunctivitis, bilateral: Secondary | ICD-10-CM | POA: Diagnosis not present

## 2021-03-02 MED ORDER — POLYMYXIN B-TRIMETHOPRIM 10000-0.1 UNIT/ML-% OP SOLN
1.0000 [drp] | OPHTHALMIC | 0 refills | Status: DC
Start: 2021-03-02 — End: 2021-03-24
  Filled 2021-03-02: qty 10, 16d supply, fill #0

## 2021-03-02 NOTE — Progress Notes (Signed)

## 2021-03-03 ENCOUNTER — Other Ambulatory Visit (HOSPITAL_COMMUNITY): Payer: Self-pay

## 2021-03-06 ENCOUNTER — Other Ambulatory Visit (HOSPITAL_COMMUNITY): Payer: Self-pay

## 2021-03-23 NOTE — Progress Notes (Signed)
Subjective:  Patient ID: Kaitlyn Sawyer, female    DOB: 03-May-1985  Age: 35 y.o. MRN: [id number]  Chief Complaint  Patient presents with   Depression   Anxiety   Hyperlipidemia    HPI  Kaitlyn Sawyer is a 35 year old Caucasian female that presents for follow-up of generalized anxiety disorder, ADHD-inattentive type, and weight management. She has rash to palm of right hand and between digits that has been present a few weeks. Pt was seen by telemedicine visit recently. Treatment included Mupirocin cream. States rash has not responded to treatment.    Anxiety, Follow-up  She was last seen for anxiety 5 months ago. Current treatment includes Trintellix 10 mg    She reports excellent compliance with treatment. She reports excellent tolerance of treatment. She is not having side effects.  She feels her anxiety is mild and Improved since last visit.  Symptoms: No chest pain No difficulty concentrating  No dizziness No fatigue  No feelings of losing control No insomnia  No irritable No palpitations  No panic attacks No racing thoughts  No shortness of breath No sweating  No tremors/shakes    GAD-7 Results GAD-7 Generalized Anxiety Disorder Screening Tool 03/24/2021 09/09/2020  1. Feeling Nervous, Anxious, or on Edge 1 1  2. Not Being Able to Stop or Control Worrying 0 1  3. Worrying Too Much About Different Things 0 1  4. Trouble Relaxing 0 0  5. Being So Restless it's Hard To Sit Still 0 0  6. Becoming Easily Annoyed or Irritable 1 1  7. Feeling Afraid As If Something Awful Might Happen 1 0  Total GAD-7 Score 3 4  Difficulty At Work, Home, or Getting  Along With Others? Not difficult at all Somewhat difficult    PHQ-9 Scores PHQ9 SCORE ONLY 03/24/2021 11/28/2020 09/09/2020  PHQ-9 Total Score 2 4 0   Adult ADHD-inattentive type: Kaitlyn Sawyer has a past medical history of ADHD-inattentive type for several years. Current treatment includes Adderall 40 mg QAM and 10 mg QPM. States symptoms  are currently well-controlled. Denies insomnia, tachycardia, or hypertension.  Weight Management:  Current BMI 29.23. Current treatment includes Semaglutide injections. She has lost 31 pounds in five months. She has experienced mild GI side effects of nausea, diarrhea, belching and burping. States she is not drinking fluids liberally or eating often.  Current Outpatient Medications on File Prior to Visit  Medication Sig Dispense Refill   amphetamine-dextroamphetamine (ADDERALL) 10 MG tablet Take two tablets (20mg ) by mouth every morning  and one tablet (10 mg) by mouth every afternoon 90 tablet 0   ondansetron (ZOFRAN) 4 MG tablet Take 1 tablet (4 mg total) by mouth daily as needed for nausea or vomiting. 30 tablet 1   Semaglutide, 1 MG/DOSE, (OZEMPIC, 1 MG/DOSE,) 4 MG/3ML SOPN Inject 1 mg into the skin once a week. 3 mL 0   vortioxetine HBr (TRINTELLIX) 10 MG TABS tablet Take 1 tablet (10 mg total) by mouth daily. 30 tablet 1   No current facility-administered medications on file prior to visit.   Past Medical History:  Diagnosis Date   Heart murmur    asymptomatic with exception during pregancy notice occ. palpitations   History of abnormal cervical Pap smear    HPV-- S/P CRYOTHERPY   History of gestational hypertension    DUE TO ECLAMPSIA   History of postpartum depression    History of seizure    x1  secondary to preclampsia 2011 (approx)  none since   Past Surgical  History:  Procedure Laterality Date   DILATION AND CURETTAGE OF UTERUS     post partem retained placenta   LAPAROSCOPIC TUBAL LIGATION Bilateral 01/23/2015   Procedure: LAPAROSCOPIC TUBAL LIGATION;  Surgeon: Marcelle Overlie, MD;  Location: [facility] [place];  Service: Gynecology;  Laterality: Bilateral;   uterine ablasion  01/06/2017   WISDOM TOOTH EXTRACTION  age 35    Family History  Problem Relation Age of Onset   Alcohol abuse Maternal Grandfather        liver failure   Heart disease Paternal  Grandfather        "massive MI"   Hyperlipidemia Paternal Grandfather    Heart attack Paternal Grandfather    Stroke Paternal Grandmother    Hyperlipidemia Paternal Grandmother    Alcohol abuse Mother    Social History   Socioeconomic History   Marital status: Divorced    Spouse name: Not on file   Number of children: 4   Years of education: Not on file   Highest education level: Not on file  Occupational History   Occupation: Sales executive  Tobacco Use   Smoking status: Former    Packs/day: 0.50    Years: 10.00    Pack years: 5.00    Types: Cigarettes    Quit date: 04/19/2017    Years since quitting: 3.9   Smokeless tobacco: Never  Vaping Use   Vaping Use: Never used  Substance and Sexual Activity   Alcohol use: Yes    Comment: social   Drug use: No   Sexual activity: Yes  Other Topics Concern   Not on file  Social History Narrative   Not on file   Social Determinants of Health   Financial Resource Strain: Not on file  Food Insecurity: Not on file  Transportation Needs: Not on file  Physical Activity: Not on file  Stress: Not on file  Social Connections: Not on file    Review of Systems  Constitutional:  Negative for chills, fatigue and fever.  HENT:  Negative for congestion, ear pain, rhinorrhea and sore throat.   Respiratory:  Negative for cough and shortness of breath.   Cardiovascular:  Negative for chest pain.  Gastrointestinal:  Positive for diarrhea. Negative for abdominal pain, constipation, nausea and vomiting.  Genitourinary:  Negative for dysuria and urgency.  Musculoskeletal:  Negative for back pain and myalgias.  Skin:  Positive for rash (right hand, between digits).  Neurological:  Negative for dizziness, weakness, light-headedness and headaches.  Psychiatric/Behavioral:  Negative for dysphoric mood. The patient is not nervous/anxious.     Objective:  BP 110/78   Pulse 77   Temp (!) 97.2 F (36.2 C)   Ht 5\' 3"  (1.6 m)   Wt 165 lb  (74.8 kg)   SpO2 99%   BMI 29.23 kg/m   BP/Weight 03/24/2021 12/18/2020 11/28/2020  Systolic BP 110 122 -  Diastolic BP 78 78 -  Wt. (Lbs) 165 188.8 196  BMI 29.23 33.44 34.72    Physical Exam Vitals reviewed.  Constitutional:      Appearance: Normal appearance.  HENT:     Head: Normocephalic.     Right Ear: Tympanic membrane normal.     Left Ear: Tympanic membrane normal.     Mouth/Throat:     Mouth: Mucous membranes are moist.  Cardiovascular:     Rate and Rhythm: Normal rate and regular rhythm.     Pulses: Normal pulses.     Heart sounds: Normal heart sounds.  Pulmonary:     Effort: Pulmonary effort is normal.     Breath sounds: Normal breath sounds.  Abdominal:     General: Bowel sounds are normal.     Palpations: Abdomen is soft.  Musculoskeletal:        General: Normal range of motion.  Skin:    General: Skin is warm and dry.     Capillary Refill: Capillary refill takes less than 2 seconds.     Findings: Rash (right palm) present.  Neurological:     General: No focal deficit present.     Mental Status: She is alert and oriented to person, place, and time.  Psychiatric:        Mood and Affect: Mood normal.        Behavior: Behavior normal.   Diabetic Foot Exam - Simple   No data filed      Lab Results  Component Value Date   WBC 8.4 09/09/2020   HGB 13.8 09/09/2020   HCT 40.3 09/09/2020   PLT 311 09/09/2020   GLUCOSE 103 (H) 09/09/2020   CHOL 154 12/10/2020   TRIG 103 12/10/2020   HDL 30 (L) 12/10/2020   LDLCALC 105 (H) 12/10/2020   ALT 66 (H) 09/09/2020   AST 38 09/09/2020   NA 139 09/09/2020   K 4.2 09/09/2020   CL 100 09/09/2020   CREATININE 0.79 09/09/2020   BUN 12 09/09/2020   CO2 22 09/09/2020   TSH 0.961 09/09/2020      Assessment & Plan:   1. Gastroesophageal reflux disease without esophagitis - pantoprazole (PROTONIX) 40 MG tablet; Take 1 tablet (40 mg total) by mouth daily.  Dispense: 90 tablet; Refill: 3 - CBC with  Differential/Platelet - Comprehensive metabolic panel - TSH - VITAMIN D 25 Hydroxy (Vit-D Deficiency, Fractures) -small frequent meals  2. Adult ADHD-well controlled - CBC with Differential/Platelet - Comprehensive metabolic panel - TSH - VITAMIN D 25 Hydroxy (Vit-D Deficiency, Fractures) -continue Adderall as prescribed  3. Anxiety-well controlled - CBC with Differential/Platelet - Comprehensive metabolic panel - TSH - VITAMIN D 25 Hydroxy (Vit-D Deficiency, Fractures) -continue Trintellix 10 mg daily  4. BMI 29.0-29.9,adult - CBC with Differential/Platelet - Comprehensive metabolic panel - TSH - VITAMIN D 25 Hydroxy (Vit-D Deficiency, Fractures) -continue Semaglutide 1 mg injection weekly  5. Rash of hand - triamcinolone cream (KENALOG) 0.1 %; Apply 1 topically 2 (two) times daily.  Dispense: 30 g; Refill: 0 -avoid alcohol hand sanitizers  6. Encounter for weight management  -31 pound weight loss in 5 months, pt is encouraged -continue Semaglutide 1 mg injection weekly -heart healthy diet -increase physical activity and fluid intake .  We will call you with lab results Continue Semaglutide 1 mg injection weekly Protonix 40 mg daily  Avoid foods that trigger GERD symptoms Eat small frequent meals Increase fluid intake Apply Triamcinolone cream to right hand rash Follow-up in 46-months   Follow-up: 1-months  An After Visit Summary was printed and given to the patient.  I, Janie Morning, NP, have reviewed all documentation for this visit. The documentation on 03/24/21 for the exam, diagnosis, procedures, and orders are all accurate and complete.    Signed, Janie Morning, NP [facility] [phone]

## 2021-03-24 ENCOUNTER — Encounter: Payer: Self-pay | Admitting: Nurse Practitioner

## 2021-03-24 ENCOUNTER — Other Ambulatory Visit (HOSPITAL_COMMUNITY): Payer: Self-pay

## 2021-03-24 ENCOUNTER — Ambulatory Visit: Payer: 59 | Admitting: Nurse Practitioner

## 2021-03-24 ENCOUNTER — Other Ambulatory Visit: Payer: Self-pay

## 2021-03-24 VITALS — BP 110/78 | HR 77 | Temp 97.2°F | Ht 63.0 in | Wt 165.0 lb

## 2021-03-24 DIAGNOSIS — F419 Anxiety disorder, unspecified: Secondary | ICD-10-CM

## 2021-03-24 DIAGNOSIS — F909 Attention-deficit hyperactivity disorder, unspecified type: Secondary | ICD-10-CM

## 2021-03-24 DIAGNOSIS — K219 Gastro-esophageal reflux disease without esophagitis: Secondary | ICD-10-CM | POA: Diagnosis not present

## 2021-03-24 DIAGNOSIS — Z6829 Body mass index (BMI) 29.0-29.9, adult: Secondary | ICD-10-CM

## 2021-03-24 DIAGNOSIS — R21 Rash and other nonspecific skin eruption: Secondary | ICD-10-CM | POA: Diagnosis not present

## 2021-03-24 DIAGNOSIS — Z7689 Persons encountering health services in other specified circumstances: Secondary | ICD-10-CM

## 2021-03-24 MED ORDER — PANTOPRAZOLE SODIUM 40 MG PO TBEC
40.0000 mg | DELAYED_RELEASE_TABLET | Freq: Every day | ORAL | 3 refills | Status: DC
  Filled 2021-03-24: qty 90, 90d supply, fill #0

## 2021-03-24 MED ORDER — TRIAMCINOLONE ACETONIDE 0.1 % EX CREA
1.0000 "application " | TOPICAL_CREAM | Freq: Two times a day (BID) | CUTANEOUS | 0 refills | Status: DC
  Filled 2021-03-24: qty 30, 7d supply, fill #0

## 2021-03-24 NOTE — Patient Instructions (Addendum)
We will call you with lab results Continue Semaglutide 1 mg injection weekly Protonix 40 mg daily  Avoid foods that trigger GERD symptoms Eat small frequent meals Increase fluid intake Apply Triamcinolone cream to right hand rash Follow-up in 27-months  Food Choices for Gastroesophageal Reflux Disease, Adult When you have gastroesophageal reflux disease (GERD), the foods you eat and your eating habits are very important. Choosing the right foods can help ease your discomfort. Think about working with a food expert (dietitian) to help you make good choices. What are tips for following this plan? Reading food labels Look for foods that are low in saturated fat. Foods that may help with your symptoms include: Foods that have less than 5% of daily value (DV) of fat. Foods that have 0 grams of trans fat. Cooking Do not fry your food. Cook your food by baking, steaming, grilling, or broiling. These are all methods that do not need a lot of fat for cooking. To add flavor, try to use herbs that are low in spice and acidity. Meal planning  Choose healthy foods that are low in fat, such as: Fruits and vegetables. Whole grains. Low-fat dairy products. Lean meats, fish, and poultry. Eat small meals often instead of eating 3 large meals each day. Eat your meals slowly in a place where you are relaxed. Avoid bending over or lying down until 2-3 hours after eating. Limit high-fat foods such as fatty meats or fried foods. Limit your intake of fatty foods, such as oils, butter, and shortening. Avoid the following as told by your doctor: Foods that cause symptoms. These may be different for different people. Keep a food diary to keep track of foods that cause symptoms. Alcohol. Drinking a lot of liquid with meals. Eating meals during the 2-3 hours before bed. Lifestyle Stay at a healthy weight. Ask your doctor what weight is healthy for you. If you need to lose weight, work with your doctor to do so  safely. Exercise for at least 30 minutes on 5 or more days each week, or as told by your doctor. Wear loose-fitting clothes. Do not smoke or use any products that contain nicotine or tobacco. If you need help quitting, ask your doctor. Sleep with the head of your bed higher than your feet. Use a wedge under the mattress or blocks under the bed frame to raise the head of the bed. Chew sugar-free gum after meals. What foods should eat? Eat a healthy, well-balanced diet of fruits, vegetables, whole grains, low-fat dairy products, lean meats, fish, and poultry. Each person is different. Foods that may cause symptoms in one person may not cause any symptoms in another person. Work with your doctor to find foods that are safe for you. The items listed above may not be a complete list of what you can eat and drink. Contact a food expert for more options. What foods should I avoid? Limiting some of these foods may help in managing the symptoms of GERD. Everyone is different. Talk with a food expert or your doctor to help you find the exact foods to avoid, if any. Fruits Any fruits prepared with added fat. Any fruits that cause symptoms. For some people, this may include citrus fruits, such as oranges, grapefruit, pineapple, and lemons. Vegetables Deep-fried vegetables. [place] fries. Any vegetables prepared with added fat. Any vegetables that cause symptoms. For some people, this may include tomatoes and tomato products, chili peppers, onions and garlic, and horseradish. Grains Pastries or quick breads with  added fat. Meats and other proteins High-fat meats, such as fatty beef or pork, hot dogs, ribs, ham, sausage, salami, and bacon. Fried meat or protein, including fried fish and fried chicken. Nuts and nut butters, in large amounts. Dairy Whole milk and chocolate milk. Sour cream. Cream. Ice cream. Cream cheese. Milkshakes. Fats and oils Butter. Margarine. Shortening. Ghee. Beverages Coffee and  tea, with or without caffeine. Carbonated beverages. Sodas. Energy drinks. Fruit juice made with acidic fruits, such as orange or grapefruit. Tomato juice. Alcoholic drinks. Sweets and desserts Chocolate and cocoa. Donuts. Seasonings and condiments Pepper. Peppermint and spearmint. Added salt. Any condiments, herbs, or seasonings that cause symptoms. For some people, this may include curry, hot sauce, or vinegar-based salad dressings. The items listed above may not be a complete list of what you should not eat and drink. Contact a food expert for more options. Questions to ask your doctor Diet and lifestyle changes are often the first steps that are taken to manage symptoms of GERD. If diet and lifestyle changes do not help, talk with your doctor about taking medicines. Where to find more information International Foundation for Gastrointestinal Disorders: aboutgerd.org Summary When you have GERD, food and lifestyle choices are very important in easing your symptoms. Eat small meals often instead of 3 large meals a day. Eat your meals slowly and in a place where you are relaxed. Avoid bending over or lying down until 2-3 hours after eating. Limit high-fat foods such as fatty meats or fried foods. This information is not intended to replace advice given to you by your health care provider. Make sure you discuss any questions you have with your health care provider. Document Revised: 10/15/2019 Document Reviewed: 10/15/2019 Elsevier Patient Education  2022 Elsevier Inc.    Exercising to [employer] Getting regular exercise is important for everyone. It is especially important if you are overweight. Being overweight increases your risk of heart disease, stroke, diabetes, high blood pressure, and several types of cancer. Exercising, and reducing the calories you consume, can help you lose weight and improve fitness and health. Exercise can be moderate or vigorous intensity. To lose weight, most  people need to do a certain amount of moderate or vigorous-intensity exercise each week. How can exercise affect me? You lose weight when you exercise enough to burn more calories than you eat. Exercise also reduces body fat and builds muscle. The more muscle you have, the more calories you burn. Exercise also: Improves mood. Reduces stress and tension. Improves your overall fitness, flexibility, and endurance. Increases bone strength. Moderate-intensity exercise Moderate-intensity exercise is any activity that gets you moving enough to burn at least three times more energy (calories) than if you were sitting. Examples of moderate exercise include: Walking a mile in 15 minutes. Doing light yard work. Biking at an easy pace. Most people should get at least 150 minutes of moderate-intensity exercise a week to maintain their body weight. Vigorous-intensity exercise Vigorous-intensity exercise is any activity that gets you moving enough to burn at least six times more calories than if you were sitting. When you exercise at this intensity, you should be working hard enough that you are not able to carry on a conversation. Examples of vigorous exercise include: Running. Playing a team sport, such as football, basketball, and soccer. Jumping rope. Most people should get at least 75 minutes a week of vigorous exercise to maintain their body weight. What actions can I take to lose weight? The amount of exercise you need  to lose weight depends on: Your age. The type of exercise. Any health conditions you have. Your overall physical ability. Talk to your health care provider about how much exercise you need and what types of activities are safe for you. Nutrition  Make changes to your diet as told by your health care provider or diet and nutrition specialist (dietitian). This may include: Eating fewer calories. Eating more protein. Eating less unhealthy fats. Eating a diet that includes fresh  fruits and vegetables, whole grains, low-fat dairy products, and lean protein. Avoiding foods with added fat, salt, and sugar. Drink plenty of water while you exercise to prevent dehydration or heat stroke. Activity Choose an activity that you enjoy and set realistic goals. Your health care provider can help you make an exercise plan that works for you. Exercise at a moderate or vigorous intensity most days of the week. The intensity of exercise may vary from person to person. You can tell how intense a workout is for you by paying attention to your breathing and heartbeat. Most people will notice their breathing and heartbeat get faster with more intense exercise. Do resistance training twice each week, such as: Push-ups. Sit-ups. Lifting weights. Using resistance bands. Getting short amounts of exercise can be just as helpful as long, structured periods of exercise. If you have trouble finding time to exercise, try doing these things as part of your daily routine: Get up, stretch, and walk around every 30 minutes throughout the day. Go for a walk during your lunch break. Park your car farther away from your destination. If you take public transportation, get off one stop early and walk the rest of the way. Make phone calls while standing up and walking around. Take the stairs instead of elevators or escalators. Wear comfortable clothes and shoes with good support. Do not exercise so much that you hurt yourself, feel dizzy, or get very short of breath. Where to find more information U.S. Department of Health and Human Services: ThisPath.fi Centers for Disease Control and Prevention: FootballExhibition.com.br Contact a health care provider: Before starting a new exercise program. If you have questions or concerns about your weight. If you have a medical problem that keeps you from exercising. Get help right away if: You have any of the following while exercising: Injury. Dizziness. Difficulty  breathing or shortness of breath that does not go away when you stop exercising. Chest pain. Rapid heartbeat. These symptoms may represent a serious problem that is an emergency. Do not wait to see if the symptoms will go away. Get medical help right away. Call your local emergency services (911 in the [place] Do not drive yourself to the hospital. Summary Getting regular exercise is especially important if you are overweight. Being overweight increases your risk of heart disease, stroke, diabetes, high blood pressure, and several types of cancer. Losing weight happens when you burn more calories than you eat. Reducing the amount of calories you eat, and getting regular moderate or vigorous exercise each week, helps you lose weight. This information is not intended to replace advice given to you by your health care provider. Make sure you discuss any questions you have with your health care provider. Document Revised: 06/01/2020 Document Reviewed: 06/01/2020 Elsevier Patient Education  2022 [employer].

## 2021-03-25 LAB — COMPREHENSIVE METABOLIC PANEL
ALT: 30 IU/L (ref 0–32)
AST: 22 IU/L (ref 0–40)
Albumin/Globulin Ratio: 1.9 (ref 1.2–2.2)
Albumin: 5 g/dL — ABNORMAL HIGH (ref 3.8–4.8)
Alkaline Phosphatase: 68 IU/L (ref 44–121)
BUN/Creatinine Ratio: 11 (ref 9–23)
BUN: 9 mg/dL (ref 6–20)
Bilirubin Total: 0.5 mg/dL (ref 0.0–1.2)
CO2: 25 mmol/L (ref 20–29)
Calcium: 10.1 mg/dL (ref 8.7–10.2)
Chloride: 101 mmol/L (ref 96–106)
Creatinine, Ser: 0.79 mg/dL (ref 0.57–1.00)
Globulin, Total: 2.6 g/dL (ref 1.5–4.5)
Glucose: 99 mg/dL (ref 70–99)
Potassium: 4.3 mmol/L (ref 3.5–5.2)
Sodium: 141 mmol/L (ref 134–144)
Total Protein: 7.6 g/dL (ref 6.0–8.5)
eGFR: 100 mL/min/{1.73_m2} (ref >59)

## 2021-03-25 LAB — CBC WITH DIFFERENTIAL/PLATELET
Basophils Absolute: 0.1 10*3/uL (ref 0.0–0.2)
Basos: 1 %
EOS (ABSOLUTE): 0.1 10*3/uL (ref 0.0–0.4)
Eos: 2 %
Hematocrit: 42.3 % (ref 34.0–46.6)
Hemoglobin: 14.4 g/dL (ref 11.1–15.9)
Immature Grans (Abs): 0 10*3/uL (ref 0.0–0.1)
Immature Granulocytes: 0 %
Lymphocytes Absolute: 2.7 10*3/uL (ref 0.7–3.1)
Lymphs: 34 %
MCH: 29.1 pg (ref 26.6–33.0)
MCHC: 34 g/dL (ref 31.5–35.7)
MCV: 86 fL (ref 79–97)
Monocytes Absolute: 0.4 10*3/uL (ref 0.1–0.9)
Monocytes: 6 %
Neutrophils Absolute: 4.6 10*3/uL (ref 1.4–7.0)
Neutrophils: 57 %
Platelets: 385 10*3/uL (ref 150–450)
RBC: 4.94 x10E6/uL (ref 3.77–5.28)
RDW: 12.4 % (ref 11.7–15.4)
WBC: 8 10*3/uL (ref 3.4–10.8)

## 2021-03-25 LAB — VITAMIN D 25 HYDROXY (VIT D DEFICIENCY, FRACTURES): Vit D, 25-Hydroxy: 25.6 ng/mL — ABNORMAL LOW (ref 30.0–100.0)

## 2021-03-25 LAB — TSH: TSH: 1.24 u[IU]/mL (ref 0.450–4.500)

## 2021-03-27 ENCOUNTER — Encounter: Payer: Self-pay | Admitting: Nurse Practitioner

## 2021-03-27 ENCOUNTER — Other Ambulatory Visit: Payer: Self-pay | Admitting: Nurse Practitioner

## 2021-03-27 ENCOUNTER — Telehealth: Payer: Self-pay

## 2021-03-27 ENCOUNTER — Other Ambulatory Visit (HOSPITAL_COMMUNITY): Payer: Self-pay

## 2021-03-27 DIAGNOSIS — E559 Vitamin D deficiency, unspecified: Secondary | ICD-10-CM

## 2021-03-27 MED ORDER — VITAMIN D (ERGOCALCIFEROL) 1.25 MG (50000 UNIT) PO CAPS
50000.0000 [IU] | ORAL_CAPSULE | ORAL | 2 refills | Status: DC
  Filled 2021-03-27: qty 4, 28d supply, fill #0
  Filled 2021-04-23: qty 4, 28d supply, fill #1
  Filled 2021-05-28: qty 4, 28d supply, fill #2
  Filled 2021-06-24: qty 4, 28d supply, fill #3

## 2021-03-27 NOTE — Telephone Encounter (Signed)
Patient calling as she saw lab results on portal. She noticed her low vitamin D. She questioned if she would be placed on script to increase this level. Made her aware there were no comments on lab work.   If script for vitamin D needs to be sent she requests [facility] as she is at work.   Lorita Officer, [place] 03/27/21 8:28 AM

## 2021-04-06 ENCOUNTER — Encounter: Payer: Self-pay | Admitting: Nurse Practitioner

## 2021-04-09 ENCOUNTER — Other Ambulatory Visit: Payer: Self-pay | Admitting: Nurse Practitioner

## 2021-04-09 ENCOUNTER — Other Ambulatory Visit (HOSPITAL_COMMUNITY): Payer: Self-pay

## 2021-04-09 DIAGNOSIS — F909 Attention-deficit hyperactivity disorder, unspecified type: Secondary | ICD-10-CM

## 2021-04-09 DIAGNOSIS — Z7689 Persons encountering health services in other specified circumstances: Secondary | ICD-10-CM

## 2021-04-09 DIAGNOSIS — E6609 Other obesity due to excess calories: Secondary | ICD-10-CM

## 2021-04-09 MED ORDER — AMPHETAMINE-DEXTROAMPHETAMINE 10 MG PO TABS
20.0000 mg | ORAL_TABLET | Freq: Two times a day (BID) | ORAL | 0 refills | Status: DC
  Filled 2021-04-09: qty 90, 30d supply, fill #0

## 2021-04-09 MED ORDER — OZEMPIC (1 MG/DOSE) 4 MG/3ML ~~LOC~~ SOPN
1.0000 mg | PEN_INJECTOR | SUBCUTANEOUS | 0 refills | Status: DC
  Filled 2021-04-09: qty 3, 28d supply, fill #0

## 2021-04-13 ENCOUNTER — Encounter: Payer: Self-pay | Admitting: Nurse Practitioner

## 2021-04-15 ENCOUNTER — Encounter: Payer: Self-pay | Admitting: Nurse Practitioner

## 2021-04-21 ENCOUNTER — Encounter: Payer: Self-pay | Admitting: Nurse Practitioner

## 2021-04-23 ENCOUNTER — Encounter: Payer: Self-pay | Admitting: Nurse Practitioner

## 2021-04-23 ENCOUNTER — Ambulatory Visit (INDEPENDENT_AMBULATORY_CARE_PROVIDER_SITE_OTHER): Payer: 59 | Admitting: Nurse Practitioner

## 2021-04-23 ENCOUNTER — Other Ambulatory Visit (HOSPITAL_COMMUNITY): Payer: Self-pay

## 2021-04-23 VITALS — Ht 63.0 in | Wt 155.0 lb

## 2021-04-23 DIAGNOSIS — K5909 Other constipation: Secondary | ICD-10-CM

## 2021-04-23 DIAGNOSIS — R197 Diarrhea, unspecified: Secondary | ICD-10-CM

## 2021-04-23 DIAGNOSIS — R112 Nausea with vomiting, unspecified: Secondary | ICD-10-CM | POA: Diagnosis not present

## 2021-04-23 NOTE — Progress Notes (Signed)
Virtual Visit via Telephone Note   This visit type was conducted due to national recommendations for restrictions regarding the COVID-19 Pandemic (e.g. social distancing) in an effort to limit this patient's exposure and mitigate transmission in our community.  Due to her co-morbid illnesses, this patient is at least at moderate risk for complications without adequate follow up.  This format is felt to be most appropriate for this patient at this time.  The patient did not have access to video technology/had technical difficulties with video requiring transitioning to audio format only (telephone).  All issues noted in this document were discussed and addressed.  No physical exam could be performed with this format.  Patient verbally consented to a telehealth visit.   Date:  04/23/2021   ID:  Kaitlyn Sawyer, DOB 07-09-1985, MRN [id number]  Patient Location: Other:  pt's work-place Provider Location: Office/Clinic  PCP:  Janie Morning, NP   Evaluation Performed:  Follow-Up Visit  Chief Complaint:  Nausea  History of Present Illness:    JAMAR CASAGRANDE is a 36 y.o. female with nausea, diarrhea, and constipation. Onset of symptoms was a few weeks ago. Treatment has included Zofran as needed. She has recently prescribed Semaglutide for weight loss. She had titrated up to 1 mg but has decreased due to symptoms. She has recently stopped Trintellix due to nausea. She tells me she has not had side effects, anxiety, or depression after discontinuing Trintellix. She tells me nausea and diarrhea have improved, constipation remains. Treatment has included Miralax without results.   The patient does not have symptoms concerning for COVID-19 infection (fever, chills, cough, or new shortness of breath).    Past Medical History:  Diagnosis Date   Heart murmur    asymptomatic with exception during pregancy notice occ. palpitations   History of abnormal cervical Pap smear    HPV-- S/P  CRYOTHERPY   History of gestational hypertension    DUE TO ECLAMPSIA   History of postpartum depression    History of seizure    x1  secondary to preclampsia 2011 (approx)  none since    Past Surgical History:  Procedure Laterality Date   DILATION AND CURETTAGE OF UTERUS     post partem retained placenta   LAPAROSCOPIC TUBAL LIGATION Bilateral 01/23/2015   Procedure: LAPAROSCOPIC TUBAL LIGATION;  Surgeon: Marcelle Overlie, MD;  Location: [facility];  Service: Gynecology;  Laterality: Bilateral;   uterine ablasion  01/06/2017   WISDOM TOOTH EXTRACTION  age 74    Family History  Problem Relation Age of Onset   Alcohol abuse Maternal Grandfather        liver failure   Heart disease Paternal Grandfather        "massive MI"   Hyperlipidemia Paternal Grandfather    Heart attack Paternal Grandfather    Stroke Paternal Grandmother    Hyperlipidemia Paternal Grandmother    Alcohol abuse Mother     Social History   Socioeconomic History   Marital status: Divorced    Spouse name: Not on file   Number of children: 4   Years of education: Not on file   Highest education level: Not on file  Occupational History   Occupation: Sales executive  Tobacco Use   Smoking status: Former    Packs/day: 0.50    Years: 10.00    Pack years: 5.00    Types: Cigarettes    Quit date: 04/19/2017    Years since quitting: 4.0   Smokeless tobacco:  Never  Vaping Use   Vaping Use: Never used  Substance and Sexual Activity   Alcohol use: Yes    Comment: social   Drug use: No   Sexual activity: Yes  Other Topics Concern   Not on file  Social History Narrative   Not on file   Social Determinants of Health   Financial Resource Strain: Not on file  Food Insecurity: Not on file  Transportation Needs: Not on file  Physical Activity: Not on file  Stress: Not on file  Social Connections: Not on file  Intimate Partner Violence: Not on file    Outpatient Medications Prior to  Visit  Medication Sig Dispense Refill   amphetamine-dextroamphetamine (ADDERALL) 10 MG tablet Take 2 tablets by mouth every morning  and 1 tablet by mouth every afternoon 90 tablet 0   ondansetron (ZOFRAN) 4 MG tablet Take 1 tablet (4 mg total) by mouth daily as needed for nausea or vomiting. 30 tablet 1   pantoprazole (PROTONIX) 40 MG tablet Take 1 tablet (40 mg total) by mouth daily. 90 tablet 3   Semaglutide, 1 MG/DOSE, (OZEMPIC, 1 MG/DOSE,) 4 MG/3ML SOPN Inject 1 mg into the skin once a week. 3 mL 0   triamcinolone cream (KENALOG) 0.1 % Apply 1 topically 2 (two) times daily. 30 g 0   Vitamin D, Ergocalciferol, (DRISDOL) 1.25 MG (50000 UNIT) CAPS capsule Take 1 capsule (50,000 Units total) by mouth every 7 (seven) days. 5 capsule 2   vortioxetine HBr (TRINTELLIX) 10 MG TABS tablet Take 1 tablet (10 mg total) by mouth daily. 30 tablet 1   No facility-administered medications prior to visit.    Allergies:   Diclegis [doxylamine-pyridoxine] and Penicillins   Social History   Tobacco Use   Smoking status: Former    Packs/day: 0.50    Years: 10.00    Pack years: 5.00    Types: Cigarettes    Quit date: 04/19/2017    Years since quitting: 4.0   Smokeless tobacco: Never  Vaping Use   Vaping Use: Never used  Substance Use Topics   Alcohol use: Yes    Comment: social   Drug use: No     Review of Systems  Constitutional:  Negative for chills, fever and malaise/fatigue.  HENT:  Negative for ear pain, sinus pain and sore throat.   Respiratory:  Negative for cough and shortness of breath.   Cardiovascular:  Negative for chest pain.  Gastrointestinal:  Positive for constipation, nausea (resolved) and vomiting (resolved).  Musculoskeletal:  Negative for myalgias.  Neurological:  Negative for headaches.    Labs/Other Tests and Data Reviewed:    Recent Labs: 03/24/2021: ALT 30; BUN 9; Creatinine, Ser 0.79; Hemoglobin 14.4; Platelets 385; Potassium 4.3; Sodium 141; TSH 1.240   Recent  Lipid Panel Lab Results  Component Value Date/Time   CHOL 154 12/10/2020 07:49 AM   TRIG 103 12/10/2020 07:49 AM   HDL 30 (L) 12/10/2020 07:49 AM   CHOLHDL 5.1 (H) 12/10/2020 07:49 AM   LDLCALC 105 (H) 12/10/2020 07:49 AM    Wt Readings from Last 3 Encounters:  03/24/21 165 lb (74.8 kg)  12/18/20 188 lb 12.8 oz (85.6 kg)  11/28/20 196 lb (88.9 kg)     Objective:    Vital Signs:  Ht 5\' 3"  (1.6 m)    Wt 155 lb (70.3 kg)    BMI 27.46 kg/m     Physical Exam No physical exam   ASSESSMENT & PLAN:  1. Nausea and vomiting, unspecified vomiting type -low fat gallbladder diet -continue Zofran as needed  2. Other constipation -Dulcolax OTC two tablets by mouth tonight -OTC probiotics, samples at front desk awaiting pt  3. Diarrhea, unspecified type -Push fluids -Avoid spicy or fried foods   Consider RUQ [place] and possible HIDA scan if symptoms fail to improve with conservative measures      COVID-19 Education: The signs and symptoms of COVID-19 were discussed with the patient and how to seek care for testing (follow up with PCP or arrange E-visit). The importance of social distancing was discussed today.   I spent 20 minutes dedicated to the care of this patient on the date of this encounter to include face-to-face time with the patient, as well as: EMR review.  Follow Up:  In Person prn  Signed,  Flonnie Hailstone, DNP  04/23/2021 1:38 PM    [facility]

## 2021-05-04 ENCOUNTER — Ambulatory Visit (HOSPITAL_BASED_OUTPATIENT_CLINIC_OR_DEPARTMENT_OTHER)
Admission: RE | Admit: 2021-05-04 | Discharge: 2021-05-04 | Disposition: A | Discharge status: Home / Self Care | Payer: 59 | Source: Ambulatory Visit | Attending: Nurse Practitioner | Admitting: Nurse Practitioner

## 2021-05-04 ENCOUNTER — Other Ambulatory Visit: Payer: Self-pay | Admitting: Nurse Practitioner

## 2021-05-04 ENCOUNTER — Encounter: Payer: Self-pay | Admitting: Nurse Practitioner

## 2021-05-04 ENCOUNTER — Other Ambulatory Visit: Payer: Self-pay

## 2021-05-04 DIAGNOSIS — R112 Nausea with vomiting, unspecified: Secondary | ICD-10-CM

## 2021-05-04 DIAGNOSIS — K760 Fatty (change of) liver, not elsewhere classified: Secondary | ICD-10-CM | POA: Diagnosis not present

## 2021-05-04 DIAGNOSIS — R1110 Vomiting, unspecified: Secondary | ICD-10-CM | POA: Diagnosis not present

## 2021-05-04 DIAGNOSIS — R197 Diarrhea, unspecified: Secondary | ICD-10-CM

## 2021-05-05 ENCOUNTER — Encounter: Payer: Self-pay | Admitting: Nurse Practitioner

## 2021-05-05 ENCOUNTER — Other Ambulatory Visit: Payer: Self-pay | Admitting: Nurse Practitioner

## 2021-05-05 DIAGNOSIS — R112 Nausea with vomiting, unspecified: Secondary | ICD-10-CM

## 2021-05-05 DIAGNOSIS — K5909 Other constipation: Secondary | ICD-10-CM

## 2021-05-05 DIAGNOSIS — R197 Diarrhea, unspecified: Secondary | ICD-10-CM

## 2021-05-06 ENCOUNTER — Encounter: Payer: Self-pay | Admitting: Nurse Practitioner

## 2021-05-12 ENCOUNTER — Other Ambulatory Visit (HOSPITAL_COMMUNITY): Payer: Self-pay

## 2021-05-12 ENCOUNTER — Other Ambulatory Visit: Payer: Self-pay | Admitting: Nurse Practitioner

## 2021-05-12 DIAGNOSIS — F909 Attention-deficit hyperactivity disorder, unspecified type: Secondary | ICD-10-CM

## 2021-05-12 MED ORDER — AMPHETAMINE-DEXTROAMPHETAMINE 10 MG PO TABS
20.0000 mg | ORAL_TABLET | Freq: Two times a day (BID) | ORAL | 0 refills | Status: DC
  Filled 2021-05-12: qty 90, 30d supply, fill #0

## 2021-05-13 ENCOUNTER — Encounter: Payer: Self-pay | Admitting: Nurse Practitioner

## 2021-05-13 ENCOUNTER — Ambulatory Visit (HOSPITAL_COMMUNITY)
Admission: RE | Admit: 2021-05-13 | Discharge: 2021-05-13 | Disposition: A | Discharge status: Home / Self Care | Payer: 59 | Source: Ambulatory Visit | Attending: Nurse Practitioner | Admitting: Nurse Practitioner

## 2021-05-13 ENCOUNTER — Other Ambulatory Visit: Payer: Self-pay

## 2021-05-13 ENCOUNTER — Other Ambulatory Visit (HOSPITAL_COMMUNITY): Payer: Self-pay

## 2021-05-13 DIAGNOSIS — R112 Nausea with vomiting, unspecified: Secondary | ICD-10-CM | POA: Diagnosis not present

## 2021-05-13 DIAGNOSIS — R197 Diarrhea, unspecified: Secondary | ICD-10-CM | POA: Diagnosis not present

## 2021-05-13 DIAGNOSIS — R1011 Right upper quadrant pain: Secondary | ICD-10-CM | POA: Diagnosis not present

## 2021-05-13 DIAGNOSIS — K5909 Other constipation: Secondary | ICD-10-CM | POA: Insufficient documentation

## 2021-05-13 MED ORDER — TECHNETIUM TC 99M MEBROFENIN IV KIT
5.5000 | PACK | Freq: Once | INTRAVENOUS | Status: AC | PRN
  Administered 2021-05-13: 07:00:00 5.5 via INTRAVENOUS

## 2021-05-21 ENCOUNTER — Ambulatory Visit (HOSPITAL_COMMUNITY): Payer: 59

## 2021-05-21 ENCOUNTER — Encounter: Payer: Self-pay | Admitting: Nurse Practitioner

## 2021-05-21 ENCOUNTER — Ambulatory Visit (INDEPENDENT_AMBULATORY_CARE_PROVIDER_SITE_OTHER): Payer: 59 | Admitting: Nurse Practitioner

## 2021-05-21 VITALS — BP 130/82 | HR 116 | Temp 96.6°F | Ht 63.0 in | Wt 151.0 lb

## 2021-05-21 DIAGNOSIS — R051 Acute cough: Secondary | ICD-10-CM

## 2021-05-21 DIAGNOSIS — J018 Other acute sinusitis: Secondary | ICD-10-CM

## 2021-05-21 DIAGNOSIS — J0191 Acute recurrent sinusitis, unspecified: Secondary | ICD-10-CM

## 2021-05-21 LAB — POCT INFLUENZA A/B
Influenza A, POC: NEGATIVE
Influenza B, POC: NEGATIVE

## 2021-05-21 LAB — POC COVID19 BINAXNOW: SARS Coronavirus 2 Ag: NEGATIVE

## 2021-05-21 MED ORDER — FLUTICASONE PROPIONATE 50 MCG/ACT NA SUSP: 2.0000 | Freq: Every day | NASAL | 6 refills | Status: DC

## 2021-05-21 MED ORDER — AZITHROMYCIN 250 MG PO TABS: ORAL_TABLET | ORAL | 0 refills | Status: AC

## 2021-05-21 NOTE — Progress Notes (Signed)
Acute Office Visit  Subjective:    Patient ID: Kaitlyn Sawyer, female    DOB: Mar 14, 1986, 36 y.o.   MRN: [id number]  Chief Complaint  Patient presents with   URI    HPI: Patient is in today for Upper respiratory symptoms She complains of congestion, nasal congestion, sore throat, left ear pain. States she has generalized body aches and fatigue.  Onset of symptoms was yesterday and worsening.She is drinking plenty of fluids.  Past history is significant for no history of pneumonia or bronchitis. Patient is non-smoker.   Past Medical History:  Diagnosis Date   Heart murmur    asymptomatic with exception during pregancy notice occ. palpitations   History of abnormal cervical Pap smear    HPV-- S/P CRYOTHERPY   History of gestational hypertension    DUE TO ECLAMPSIA   History of postpartum depression    History of seizure    x1  secondary to preclampsia 2011 (approx)  none since    Past Surgical History:  Procedure Laterality Date   DILATION AND CURETTAGE OF UTERUS     post partem retained placenta   LAPAROSCOPIC TUBAL LIGATION Bilateral 01/23/2015   Procedure: LAPAROSCOPIC TUBAL LIGATION;  Surgeon: Dian Queen, MD;  Location: [place];  Service: Gynecology;  Laterality: Bilateral;   uterine ablasion  01/06/2017   WISDOM TOOTH EXTRACTION  age 36    Family History  Problem Relation Age of Onset   Alcohol abuse Maternal Grandfather        liver failure   Heart disease Paternal Grandfather        "massive MI"   Hyperlipidemia Paternal Grandfather    Heart attack Paternal Grandfather    Stroke Paternal Grandmother    Hyperlipidemia Paternal Grandmother    Alcohol abuse Mother     Social History   Socioeconomic History   Marital status: Divorced    Spouse name: Not on file   Number of children: 4   Years of education: Not on file   Highest education level: Not on file  Occupational History   Occupation: Art therapist  Tobacco Use    Smoking status: Former    Packs/day: 0.50    Years: 10.00    Pack years: 5.00    Types: Cigarettes    Quit date: 04/19/2017    Years since quitting: 4.0   Smokeless tobacco: Never  Vaping Use   Vaping Use: Never used  Substance and Sexual Activity   Alcohol use: Yes    Comment: social   Drug use: No   Sexual activity: Yes  Other Topics Concern   Not on file  Social History Narrative   Not on file   Social Determinants of Health   Financial Resource Strain: Not on file  Food Insecurity: Not on file  Transportation Needs: Not on file  Physical Activity: Not on file  Stress: Not on file  Social Connections: Not on file  Intimate Partner Violence: Not on file    Outpatient Medications Prior to Visit  Medication Sig Dispense Refill   amphetamine-dextroamphetamine (ADDERALL) 10 MG tablet Take 2 tablets by mouth every morning  and 1 tablet by mouth every afternoon 90 tablet 0   ondansetron (ZOFRAN) 4 MG tablet Take 1 tablet (4 mg total) by mouth daily as needed for nausea or vomiting. 30 tablet 1   pantoprazole (PROTONIX) 40 MG tablet Take 1 tablet (40 mg total) by mouth daily. 90 tablet 3   Semaglutide, 1 MG/DOSE, (OZEMPIC,  1 MG/DOSE,) 4 MG/3ML SOPN Inject 1 mg into the skin once a week. 3 mL 0   triamcinolone cream (KENALOG) 0.1 % Apply 1 topically 2 (two) times daily. 30 g 0   Vitamin D, Ergocalciferol, (DRISDOL) 1.25 MG (50000 UNIT) CAPS capsule Take 1 capsule (50,000 Units total) by mouth every 7 (seven) days. 5 capsule 2   No facility-administered medications prior to visit.    Allergies  Allergen Reactions   Diclegis [Doxylamine-Pyridoxine] Hives, Itching and Rash   Penicillins Swelling and Rash    Has patient had a PCN reaction causing immediate rash, facial/tongue/throat swelling, SOB or lightheadedness with hypotension: no Has patient had a PCN reaction causing severe rash involving mucus membranes or skin necrosis: unknown Has patient had a PCN reaction that  required hospitalization:no Has patient had a PCN reaction occurring within the last 10 years:no If all of the above answers are "NO", then may proceed with Cephalosporin use.     Review of Systems  Constitutional:  Positive for fatigue. Negative for chills and fever.  HENT:  Positive for congestion and sore throat. Negative for ear pain, postnasal drip, rhinorrhea, sinus pressure and sinus pain.   Eyes: Negative.   Respiratory:  Positive for cough. Negative for shortness of breath.   Cardiovascular:  Negative for chest pain.  Gastrointestinal:  Negative for diarrhea and nausea.  Endocrine: Negative.   Genitourinary: Negative.   Musculoskeletal:        Generalized body aches  Skin: Negative.   Neurological:  Negative for dizziness and headaches.  Hematological: Negative.       Objective:    Physical Exam Vitals reviewed.  HENT:     Right Ear: Tympanic membrane normal.     Left Ear: Tympanic membrane normal.     Nose: Congestion and rhinorrhea present.     Mouth/Throat:     Pharynx: Posterior oropharyngeal erythema present.  Cardiovascular:     Rate and Rhythm: Regular rhythm. Tachycardia present.     Pulses: Normal pulses.     Heart sounds: Normal heart sounds.  Pulmonary:     Effort: Pulmonary effort is normal.     Breath sounds: Normal breath sounds.  Abdominal:     General: Bowel sounds are normal.     Palpations: Abdomen is soft.  Skin:    General: Skin is warm and dry.     Capillary Refill: Capillary refill takes less than 2 seconds.  Neurological:     General: No focal deficit present.     Mental Status: She is alert and oriented to person, place, and time.  Psychiatric:        Mood and Affect: Mood normal.        Behavior: Behavior normal.    BP 130/82    Pulse (!) 116    Temp (!) 96.6 F (35.9 C)    Ht _0  (1.6 m)    Wt 151 lb (68.5 kg)    SpO2 99%    BMI 26.75 kg/m   Wt Readings from Last 3 Encounters:  04/23/21 155 lb (70.3 kg)  03/24/21 165 lb  (74.8 kg)  12/18/20 188 lb 12.8 oz (85.6 kg)    Health Maintenance Due  Topic Date Due   PAP SMEAR-Modifier  Never done       Lab Results  Component Value Date   TSH 1.240 03/24/2021   Lab Results  Component Value Date   WBC 8.0 03/24/2021   HGB 14.4 03/24/2021   HCT 42.3  03/24/2021   MCV 86 03/24/2021   PLT 385 03/24/2021   Lab Results  Component Value Date   NA 141 03/24/2021   K 4.3 03/24/2021   CO2 25 03/24/2021   GLUCOSE 99 03/24/2021   BUN 9 03/24/2021   CREATININE 0.79 03/24/2021   BILITOT 0.5 03/24/2021   ALKPHOS 68 03/24/2021   AST 22 03/24/2021   ALT 30 03/24/2021   PROT 7.6 03/24/2021   ALBUMIN 5.0 (H) 03/24/2021   CALCIUM 10.1 03/24/2021   ANIONGAP 7 03/19/2016   EGFR 100 03/24/2021   Lab Results  Component Value Date   CHOL 154 12/10/2020   Lab Results  Component Value Date   HDL 30 (L) 12/10/2020   Lab Results  Component Value Date   LDLCALC 105 (H) 12/10/2020   Lab Results  Component Value Date   TRIG 103 12/10/2020   Lab Results  Component Value Date   CHOLHDL 5.1 (H) 12/10/2020        Assessment & Plan:    1. Other acute sinusitis, recurrence not specified - azithromycin (ZITHROMAX) 250 MG tablet; Take 2 tablets on day 1, then 1 tablet daily on days 2 through 5  Dispense: 6 tablet; Refill: 0 - fluticasone (FLONASE) 50 MCG/ACT nasal spray; Place 2 sprays into both nostrils daily.  Dispense: 16 g; Refill: 6  2. Acute cough - POCT Influenza A/B-negative - POC COVID-19 BinaxNow-negative    Take Z-pack as prescribed Use Flonase nasal spray daily Rest and push fluids Follow-up as needed  Follow-up: PRN  An After Visit Summary was printed and given to the patient.  I, Rip Harbour, NP, have reviewed all documentation for this visit. The documentation on 05/22/21 for the exam, diagnosis, procedures, and orders are all accurate and complete.    Signed, Rip Harbour, NP [facility] [phone]

## 2021-05-21 NOTE — Patient Instructions (Addendum)
Take Z-pack as prescribed Use Flonase nasal spray daily Rest and push fluids Follow-up as needed  Sinusitis, Adult Sinusitis is soreness and swelling (inflammation) of your sinuses. Sinuses are hollow spaces in the bones around your face. They are located: Around your eyes. In the middle of your forehead. Behind your nose. In your cheekbones. Your sinuses and nasal passages are lined with a fluid called mucus. Mucus drains out of your sinuses. Swelling can trap mucus in your sinuses. This lets germs (bacteria, virus, or fungus) grow, which leads to infection. Most of the time, this condition is caused by a virus. What are the causes? This condition is caused by: Allergies. Asthma. Germs. Things that block your nose or sinuses. Growths in the nose (nasal polyps). Chemicals or irritants in the air. Fungus (rare). What increases the risk? You are more likely to develop this condition if: You have a weak body defense system (immune system). You do a lot of swimming or diving. You use nasal sprays too much. You smoke. What are the signs or symptoms? The main symptoms of this condition are pain and a feeling of pressure around the sinuses. Other symptoms include: Stuffy nose (congestion). Runny nose (drainage). Swelling and warmth in the sinuses. Headache. Toothache. A cough that may get worse at night. Mucus that collects in the throat or the back of the nose (postnasal drip). Being unable to smell and taste. Being very tired (fatigue). A fever. Sore throat. Bad breath. How is this diagnosed? This condition is diagnosed based on: Your symptoms. Your medical history. A physical exam. Tests to find out if your condition is short-term (acute) or long-term (chronic). Your doctor may: Check your nose for growths (polyps). Check your sinuses using a tool that has a light (endoscope). Check for allergies or germs. Do imaging tests, such as an MRI or CT scan. How is this  treated? Treatment for this condition depends on the cause and whether it is short-term or long-term. If caused by a virus, your symptoms should go away on their own within 10 days. You may be given medicines to relieve symptoms. They include: Medicines that shrink swollen tissue in the nose. Medicines that treat allergies (antihistamines). A spray that treats swelling of the nostrils.  Rinses that help get rid of thick mucus in your nose (nasal saline washes). If caused by bacteria, your doctor may wait to see if you will get better without treatment. You may be given antibiotic medicine if you have: A very bad infection. A weak body defense system. If caused by growths in the nose, you may need to have surgery. Follow these instructions at home: Medicines Take, use, or apply over-the-counter and prescription medicines only as told by your doctor. These may include nasal sprays. If you were prescribed an antibiotic medicine, take it as told by your doctor. Do not stop taking the antibiotic even if you start to feel better. Hydrate and humidify  Drink enough water to keep your pee (urine) pale yellow. Use a cool mist humidifier to keep the humidity level in your home above 50%. Breathe in steam for 10-15 minutes, 3-4 times a day, or as told by your doctor. You can do this in the bathroom while a hot shower is running. Try not to spend time in cool or dry air. Rest Rest as much as you can. Sleep with your head raised (elevated). Make sure you get enough sleep each night. General instructions  Put a warm, moist washcloth on your face  3-4 times a day, or as often as told by your doctor. This will help with discomfort. Wash your hands often with soap and water. If there is no soap and water, use hand sanitizer. Do not smoke. Avoid being around people who are smoking (secondhand smoke). Keep all follow-up visits as told by your doctor. This is important. Contact a doctor if: You have a  fever. Your symptoms get worse. Your symptoms do not get better within 10 days. Get help right away if: You have a very bad headache. You cannot stop throwing up (vomiting). You have very bad pain or swelling around your face or eyes. You have trouble seeing. You feel confused. Your neck is stiff. You have trouble breathing. Summary Sinusitis is swelling of your sinuses. Sinuses are hollow spaces in the bones around your face. This condition is caused by tissues in your nose that become inflamed or swollen. This traps germs. These can lead to infection. If you were prescribed an antibiotic medicine, take it as told by your doctor. Do not stop taking it even if you start to feel better. Keep all follow-up visits as told by your doctor. This is important. This information is not intended to replace advice given to you by your health care provider. Make sure you discuss any questions you have with your health care provider. Document Revised: 09/05/2017 Document Reviewed: 09/05/2017 Elsevier Patient Education  2022 [employer].

## 2021-05-22 ENCOUNTER — Encounter: Payer: Self-pay | Admitting: Nurse Practitioner

## 2021-05-24 ENCOUNTER — Encounter: Payer: Self-pay | Admitting: Nurse Practitioner

## 2021-05-25 ENCOUNTER — Other Ambulatory Visit (HOSPITAL_COMMUNITY): Payer: Self-pay

## 2021-05-25 ENCOUNTER — Other Ambulatory Visit: Payer: Self-pay | Admitting: Nurse Practitioner

## 2021-05-25 DIAGNOSIS — Z6833 Body mass index (BMI) 33.0-33.9, adult: Secondary | ICD-10-CM

## 2021-05-25 DIAGNOSIS — E6609 Other obesity due to excess calories: Secondary | ICD-10-CM

## 2021-05-25 DIAGNOSIS — Z7689 Persons encountering health services in other specified circumstances: Secondary | ICD-10-CM

## 2021-05-25 MED ORDER — SEMAGLUTIDE(0.25 OR 0.5MG/DOS) 2 MG/1.5ML ~~LOC~~ SOPN
0.5000 mg | PEN_INJECTOR | SUBCUTANEOUS | 1 refills | Status: DC
  Filled 2021-05-25: qty 1.5, 28d supply, fill #0
  Filled 2021-06-24: qty 1.5, 28d supply, fill #1

## 2021-05-28 ENCOUNTER — Other Ambulatory Visit (HOSPITAL_COMMUNITY): Payer: Self-pay

## 2021-06-03 ENCOUNTER — Encounter: Payer: Self-pay | Admitting: Nurse Practitioner

## 2021-06-09 ENCOUNTER — Other Ambulatory Visit: Payer: Self-pay

## 2021-06-09 ENCOUNTER — Encounter: Payer: Self-pay | Admitting: Nurse Practitioner

## 2021-06-09 ENCOUNTER — Other Ambulatory Visit (HOSPITAL_COMMUNITY): Payer: Self-pay

## 2021-06-09 ENCOUNTER — Ambulatory Visit: Payer: 59 | Admitting: Nurse Practitioner

## 2021-06-09 VITALS — BP 132/80 | HR 113 | Temp 96.7°F | Ht 63.0 in | Wt 146.0 lb

## 2021-06-09 DIAGNOSIS — F321 Major depressive disorder, single episode, moderate: Secondary | ICD-10-CM | POA: Diagnosis not present

## 2021-06-09 DIAGNOSIS — F411 Generalized anxiety disorder: Secondary | ICD-10-CM | POA: Diagnosis not present

## 2021-06-09 MED ORDER — BUSPIRONE HCL 10 MG PO TABS
10.0000 mg | ORAL_TABLET | Freq: Two times a day (BID) | ORAL | 3 refills | Status: DC
  Filled 2021-06-09: qty 60, 30d supply, fill #0

## 2021-06-09 NOTE — Patient Instructions (Signed)
Begin Buspar 10 mg twice daily Follow-up in 4 weeks, virtual  Managing Anxiety, Adult After being diagnosed with anxiety, you may be relieved to know why you have felt or behaved a certain way. You may also feel overwhelmed about the treatment ahead and what it will mean for your life. With care and support, you can manage this condition. How to manage lifestyle changes Managing stress and anxiety Stress is your body's reaction to life changes and events, both good and bad. Most stress will last just a few hours, but stress can be ongoing and can lead to more than just stress. Although stress can play a major role in anxiety, it is not the same as anxiety. Stress is usually caused by something external, such as a deadline, test, or competition. Stress normally passes after the triggering event has ended.  Anxiety is caused by something internal, such as imagining a terrible outcome or worrying that something will go wrong that will devastate you. Anxiety often does not go away even after the triggering event is over, and it can become long-term (chronic) worry. It is important to understand the differences between stress and anxiety and to manage your stress effectively so that it does not lead to an anxious response. Talk with your health care provider or a counselor to learn more about reducing anxiety and stress. He or she may suggest tension reduction techniques, such as: Music therapy. Spend time creating or listening to music that you enjoy and that inspires you. Mindfulness-based meditation. Practice being aware of your normal breaths while not trying to control your breathing. It can be done while sitting or walking. Centering prayer. This involves focusing on a word, phrase, or sacred image that means something to you and brings you peace. Deep breathing. To do this, expand your stomach and inhale slowly through your nose. Hold your breath for 3-5 seconds. Then exhale slowly, letting your  stomach muscles relax. Self-talk. Learn to notice and identify thought patterns that lead to anxiety reactions and change those patterns to thoughts that feel peaceful. Muscle relaxation. Taking time to tense muscles and then relax them. Choose a tension reduction technique that fits your lifestyle and personality. These techniques take time and practice. Set aside 5-15 minutes a day to do them. Therapists can offer counseling and training in these techniques. The training to help with anxiety may be covered by some insurance plans. Other things you can do to manage stress and anxiety include: Keeping a stress diary. This can help you learn what triggers your reaction and then learn ways to manage your response. Thinking about how you react to certain situations. You may not be able to control everything, but you can control your response. Making time for activities that help you relax and not feeling guilty about spending your time in this way. Doing visual imagery. This involves imagining or creating mental pictures to help you relax. Practicing yoga. Through yoga poses, you can lower tension and promote relaxation.  Medicines Medicines can help ease symptoms. Medicines for anxiety include: Antidepressant medicines. These are usually prescribed for long-term daily control. Anti-anxiety medicines. These may be added in severe cases, especially when panic attacks occur. Medicines will be prescribed by a health care provider. When used together, medicines, psychotherapy, and tension reduction techniques may be the most effective treatment. Relationships Relationships can play a big part in helping you recover. Try to spend more time connecting with trusted friends and family members. Consider going to couples counseling if  you have a partner, taking family education classes, or going to family therapy. Therapy can help you and others better understand your condition. How to recognize changes in  your anxiety Everyone responds differently to treatment for anxiety. Recovery from anxiety happens when symptoms decrease and stop interfering with your daily activities at home or work. This may mean that you will start to: Have better concentration and focus. Worry will interfere less in your daily thinking. Sleep better. Be less irritable. Have more energy. Have improved memory. It is also important to recognize when your condition is getting worse. Contact your health care provider if your symptoms interfere with home or work and you feel like your condition is not improving. Follow these instructions at home: Activity Exercise. Adults should do the following: Exercise for at least 150 minutes each week. The exercise should increase your heart rate and make you sweat (moderate-intensity exercise). Strengthening exercises at least twice a week. Get the right amount and quality of sleep. Most adults need 7-9 hours of sleep each night. Lifestyle  Eat a healthy diet that includes plenty of vegetables, fruits, whole grains, low-fat dairy products, and lean protein. Do not eat a lot of foods that are high in fats, added sugars, or salt (sodium). Make choices that simplify your life. Do not use any products that contain nicotine or tobacco. These products include cigarettes, chewing tobacco, and vaping devices, such as e-cigarettes. If you need help quitting, ask your health care provider. Avoid caffeine, alcohol, and certain over-the-counter cold medicines. These may make you feel worse. Ask your pharmacist which medicines to avoid. General instructions Take over-the-counter and prescription medicines only as told by your health care provider. Keep all follow-up visits. This is important. Where to find support You can get help and support from these sources: Self-help groups. Online and [employer]. A trusted spiritual leader. Couples counseling. Family education  classes. Family therapy. Where to find more information You may find that joining a support group helps you deal with your anxiety. The following sources can help you locate counselors or support groups near you: Mental Health America: www.mentalhealthamerica.net Anxiety and Depression Association of [place] (ADAA): [employer] on Mental Illness (NAMI): www.nami.org Contact a health care provider if: You have a hard time staying focused or finishing daily tasks. You spend many hours a day feeling worried about everyday life. You become exhausted by worry. You start to have headaches or frequently feel tense. You develop chronic nausea or diarrhea. Get help right away if: You have a racing heart and shortness of breath. You have thoughts of hurting yourself or others. If you ever feel like you may hurt yourself or others, or have thoughts about taking your own life, get help right away. Go to your nearest emergency department or: Call your local emergency services (911 in the [place] Call a suicide crisis helpline, such as the National Suicide Prevention Lifeline at (979)262-2837 or 988 in the [place] This is open 24 hours a day in the [place] Text the Crisis Text Line at (806)818-9902 (in the [place] Summary Taking steps to learn and use tension reduction techniques can help calm you and help prevent triggering an anxiety reaction. When used together, medicines, psychotherapy, and tension reduction techniques may be the most effective treatment. Family, friends, and partners can play a big part in supporting you. This information is not intended to replace advice given to you by your health care provider. Make sure you discuss any questions you have with  your health care provider. Document Revised: 10/29/2020 Document Reviewed: 07/27/2020 Elsevier Patient Education  [place].

## 2021-06-09 NOTE — Progress Notes (Signed)
Established Patient Office Visit  Subjective:  Patient ID: Kaitlyn Sawyer, female    DOB: Feb 07, 1986  Age: 36 y.o. MRN: [id number]  CC:  Chief Complaint  Patient presents with   Depression   Anxiety    HPI ROOSEVELT BISHER presents for evaluation of depression and anxiety. She was diagnosed with depression and anxiety several years ago. She is not currently taking any prescription medication or seeing a therapist. She has previously prescribed Lexapro, Effexor, and Trintellix. She tells me her anxiety is worse than depression currently. She is a single mother of four children. Works full-time for a Pharmacist, community in [place]. Reports her four children play sports and have a busy ball season during Spring and Summer months.   Depression, Follow-up  She  was last seen for this 7 months ago. She is not currently taking any treatment for depression    Depression screen [facility] 2/9 06/09/2021 03/24/2021 11/28/2020  Decreased Interest 0 0 0  Down, Depressed, Hopeless 0 0 0  PHQ - 2 Score 0 0 0  Altered sleeping 1 0 0  Tired, decreased energy '1 2 2  ' Change in appetite 0 0 2  Feeling bad or failure about yourself  0 0 0  Trouble concentrating 0 0 0  Moving slowly or fidgety/restless 1 0 0  Suicidal thoughts 0 0 0  PHQ-9 Score '3 2 4  ' Difficult doing work/chores Not difficult at all Not difficult at all Somewhat difficult     Anxiety, Follow-up  She was last seen for anxiety 7 months ago. Currently not taking anything for treatment.    Symptoms: No chest pain No difficulty concentrating  No dizziness No fatigue  No feelings of losing control Yes insomnia  Yes irritable No palpitations  No panic attacks No racing thoughts  No shortness of breath No sweating  No tremors/shakes    GAD-7 Results GAD-7 Generalized Anxiety Disorder Screening Tool 06/09/2021 03/24/2021 09/09/2020  1. Feeling Nervous, Anxious, or on Edge '1 1 1  ' 2. Not Being Able to Stop or Control Worrying 0 0 1  3.  Worrying Too Much About Different Things 1 0 1  4. Trouble Relaxing 0 0 0  5. Being So Restless it's Hard To Sit Still 0 0 0  6. Becoming Easily Annoyed or Irritable '3 1 1  ' 7. Feeling Afraid As If Something Awful Might Happen 0 1 0  Total GAD-7 Score '5 3 4  ' Difficulty At Work, Home, or Getting  Along With Others? Not difficult at all Not difficult at all Somewhat difficult         Past Medical History:  Diagnosis Date   Heart murmur    asymptomatic with exception during pregancy notice occ. palpitations   History of abnormal cervical Pap smear    HPV-- S/P CRYOTHERPY   History of gestational hypertension    DUE TO ECLAMPSIA   History of postpartum depression    History of seizure    x1  secondary to preclampsia 2011 (approx)  none since    Past Surgical History:  Procedure Laterality Date   DILATION AND CURETTAGE OF UTERUS     post partem retained placenta   LAPAROSCOPIC TUBAL LIGATION Bilateral 01/23/2015   Procedure: LAPAROSCOPIC TUBAL LIGATION;  Surgeon: Dian Queen, MD;  Location: [facility];  Service: Gynecology;  Laterality: Bilateral;   uterine ablasion  01/06/2017   WISDOM TOOTH EXTRACTION  age 67    Family History  Problem Relation Age of Onset  Alcohol abuse Maternal Grandfather        liver failure   Heart disease Paternal Grandfather        "massive MI"   Hyperlipidemia Paternal Grandfather    Heart attack Paternal Grandfather    Stroke Paternal Grandmother    Hyperlipidemia Paternal Grandmother    Alcohol abuse Mother     Social History   Socioeconomic History   Marital status: Divorced    Spouse name: Not on file   Number of children: 4   Years of education: Not on file   Highest education level: Not on file  Occupational History   Occupation: Art therapist  Tobacco Use   Smoking status: Former    Packs/day: 0.50    Years: 10.00    Pack years: 5.00    Types: Cigarettes    Quit date: 04/19/2017    Years since quitting:  4.1   Smokeless tobacco: Never  Vaping Use   Vaping Use: Never used  Substance and Sexual Activity   Alcohol use: Yes    Comment: social   Drug use: No   Sexual activity: Yes  Other Topics Concern   Not on file  Social History Narrative   Not on file   Social Determinants of Health   Financial Resource Strain: Not on file  Food Insecurity: Not on file  Transportation Needs: Not on file  Physical Activity: Not on file  Stress: Not on file  Social Connections: Not on file  Intimate Partner Violence: Not on file    Outpatient Medications Prior to Visit  Medication Sig Dispense Refill   amphetamine-dextroamphetamine (ADDERALL) 10 MG tablet Take 2 tablets by mouth every morning  and 1 tablet by mouth every afternoon 90 tablet 0   fluticasone (FLONASE) 50 MCG/ACT nasal spray Place 2 sprays into both nostrils daily. 16 g 6   ondansetron (ZOFRAN) 4 MG tablet Take 1 tablet (4 mg total) by mouth daily as needed for nausea or vomiting. 30 tablet 1   pantoprazole (PROTONIX) 40 MG tablet Take 1 tablet (40 mg total) by mouth daily. 90 tablet 3   Semaglutide,0.25 or 0.5MG/DOS, 2 MG/1.5ML SOPN Inject 0.5 mg into the skin once a week. 4.5 mL 1   triamcinolone cream (KENALOG) 0.1 % Apply 1 topically 2 (two) times daily. 30 g 0   Vitamin D, Ergocalciferol, (DRISDOL) 1.25 MG (50000 UNIT) CAPS capsule Take 1 capsule (50,000 Units total) by mouth every 7 (seven) days. 5 capsule 2   No facility-administered medications prior to visit.    Allergies  Allergen Reactions   Trintellix [Vortioxetine] Nausea Only   Diclegis [Doxylamine-Pyridoxine] Hives, Itching and Rash   Penicillins Swelling and Rash    Has patient had a PCN reaction causing immediate rash, facial/tongue/throat swelling, SOB or lightheadedness with hypotension: no Has patient had a PCN reaction causing severe rash involving mucus membranes or skin necrosis: unknown Has patient had a PCN reaction that required hospitalization:no Has  patient had a PCN reaction occurring within the last 10 years:no If all of the above answers are "NO", then may proceed with Cephalosporin use.     ROS Review of Systems  Psychiatric/Behavioral:  Positive for agitation (irritability) and sleep disturbance (insomnia).      Objective:    Physical Exam Vitals reviewed.  Cardiovascular:     Rate and Rhythm: Normal rate and regular rhythm.     Pulses: Normal pulses.     Heart sounds: Normal heart sounds.  Pulmonary:  Effort: Pulmonary effort is normal.     Breath sounds: Normal breath sounds.  Skin:    General: Skin is warm and dry.     Capillary Refill: Capillary refill takes less than 2 seconds.  Neurological:     General: No focal deficit present.     Mental Status: She is alert and oriented to person, place, and time.  Psychiatric:        Mood and Affect: Mood normal.        Behavior: Behavior normal.   BP 132/80    Pulse (!) 113    Temp (!) 96.7 F (35.9 C)    Ht '5\' 3"'  (1.6 m)    Wt 146 lb (66.2 kg)    SpO2 99%    BMI 25.86 kg/m  Wt Readings from Last 3 Encounters:  06/09/21 146 lb (66.2 kg)  05/21/21 151 lb (68.5 kg)  04/23/21 155 lb (70.3 kg)     Health Maintenance Due  Topic Date Due   PAP SMEAR-Modifier  Never done      Lab Results  Component Value Date   TSH 1.240 03/24/2021   Lab Results  Component Value Date   WBC 8.0 03/24/2021   HGB 14.4 03/24/2021   HCT 42.3 03/24/2021   MCV 86 03/24/2021   PLT 385 03/24/2021   Lab Results  Component Value Date   NA 141 03/24/2021   K 4.3 03/24/2021   CO2 25 03/24/2021   GLUCOSE 99 03/24/2021   BUN 9 03/24/2021   CREATININE 0.79 03/24/2021   BILITOT 0.5 03/24/2021   ALKPHOS 68 03/24/2021   AST 22 03/24/2021   ALT 30 03/24/2021   PROT 7.6 03/24/2021   ALBUMIN 5.0 (H) 03/24/2021   CALCIUM 10.1 03/24/2021   ANIONGAP 7 03/19/2016   EGFR 100 03/24/2021   Lab Results  Component Value Date   CHOL 154 12/10/2020   Lab Results  Component Value  Date   HDL 30 (L) 12/10/2020   Lab Results  Component Value Date   LDLCALC 105 (H) 12/10/2020   Lab Results  Component Value Date   TRIG 103 12/10/2020   Lab Results  Component Value Date   CHOLHDL 5.1 (H) 12/10/2020   No results found for: HGBA1C    Assessment & Plan:   1. GAD (generalized anxiety disorder)-not at goal - busPIRone (BUSPAR) 10 MG tablet; Take 1 tablet by mouth 2 (two) times daily.  Dispense: 60 tablet; Refill: 3  2. Depression, major, single episode, moderate (HCC)-well controlled   Begin Buspar 10 mg twice daily Follow-up in 4 weeks, virtual   Follow-up: 4-weeks, virtual   I, Rip Harbour, NP, have reviewed all documentation for this visit. The documentation on 06/09/21 for the exam, diagnosis, procedures, and orders are all accurate and complete.    Signed, Rip Harbour, NP

## 2021-06-09 NOTE — Progress Notes (Deleted)
Subjective:  Patient ID: Kaitlyn Sawyer, female    DOB: 07-05-1985  Age: 36 y.o. MRN: [id number]  Chief Complaint  Patient presents with   Depression   Anxiety    HPI   Depression, Follow-up  She  was last seen for this 2 months ago. Changes made at last visit include ***.   She reports {excellent/good/fair/poor:19665} compliance with treatment. She {is/is not:21021397} having side effects. ***  She reports {DESC; GOOD/FAIR/POOR:18685} tolerance of treatment. Current symptoms include: {Symptoms; depression:1002} She feels she is {improved/worse/unchanged:3041574} since last visit.  Depression screen [facility] 2/9 06/09/2021 03/24/2021 11/28/2020  Decreased Interest 0 0 0  Down, Depressed, Hopeless 0 0 0  PHQ - 2 Score 0 0 0  Altered sleeping 1 0 0  Tired, decreased energy 1 2 2   Change in appetite 0 0 2  Feeling bad or failure about yourself  0 0 0  Trouble concentrating 0 0 0  Moving slowly or fidgety/restless 1 0 0  Suicidal thoughts 0 0 0  PHQ-9 Score 3 2 4   Difficult doing work/chores Not difficult at all Not difficult at all Somewhat difficult       Anxiety, Follow-up  She was last seen for anxiety 2 months ago. Changes made at last visit include ***.   She reports {excellent/good/fair/poor:19665} compliance with treatment. She reports {good/fair/poor:18685} tolerance of treatment. She {is/is not:21021397} having side effects. {document side effects if present:1}  She feels her anxiety is {Desc; severity:60313} and {improved/worse/unchanged:3041574} since last visit.  GAD-7 Results GAD-7 Generalized Anxiety Disorder Screening Tool 06/09/2021 03/24/2021 09/09/2020  1. Feeling Nervous, Anxious, or on Edge 1 1 1   2. Not Being Able to Stop or Control Worrying 0 0 1  3. Worrying Too Much About Different Things 1 0 1  4. Trouble Relaxing 0 0 0  5. Being So Restless it's Hard To Sit Still 0 0 0  6. Becoming Easily Annoyed or Irritable 3 1 1   7. Feeling Afraid As If  Something Awful Might Happen 0 1 0  Total GAD-7 Score 5 3 4   Difficulty At Work, Home, or Getting  Along With Others? Not difficult at all Not difficult at all Somewhat difficult    PHQ-9 Scores PHQ9 SCORE ONLY 06/09/2021 03/24/2021 11/28/2020  PHQ-9 Total Score 3 2 4      Current Outpatient Medications on File Prior to Visit  Medication Sig Dispense Refill   amphetamine-dextroamphetamine (ADDERALL) 10 MG tablet Take 2 tablets by mouth every morning  and 1 tablet by mouth every afternoon 90 tablet 0   fluticasone (FLONASE) 50 MCG/ACT nasal spray Place 2 sprays into both nostrils daily. 16 g 6   ondansetron (ZOFRAN) 4 MG tablet Take 1 tablet (4 mg total) by mouth daily as needed for nausea or vomiting. 30 tablet 1   pantoprazole (PROTONIX) 40 MG tablet Take 1 tablet (40 mg total) by mouth daily. 90 tablet 3   Semaglutide,0.25 or 0.5MG /DOS, 2 MG/1.5ML SOPN Inject 0.5 mg into the skin once a week. 4.5 mL 1   triamcinolone cream (KENALOG) 0.1 % Apply 1 topically 2 (two) times daily. 30 g 0   Vitamin D, Ergocalciferol, (DRISDOL) 1.25 MG (50000 UNIT) CAPS capsule Take 1 capsule (50,000 Units total) by mouth every 7 (seven) days. 5 capsule 2   No current facility-administered medications on file prior to visit.   Past Medical History:  Diagnosis Date   Heart murmur    asymptomatic with exception during pregancy notice occ. palpitations   History  of abnormal cervical Pap smear    HPV-- S/P CRYOTHERPY   History of gestational hypertension    DUE TO ECLAMPSIA   History of postpartum depression    History of seizure    x1  secondary to preclampsia 2011 (approx)  none since   Past Surgical History:  Procedure Laterality Date   DILATION AND CURETTAGE OF UTERUS     post partem retained placenta   LAPAROSCOPIC TUBAL LIGATION Bilateral 01/23/2015   Procedure: LAPAROSCOPIC TUBAL LIGATION;  Surgeon: Dian Queen, MD;  Location: [facility];  Service: Gynecology;  Laterality:  Bilateral;   uterine ablasion  01/06/2017   WISDOM TOOTH EXTRACTION  age 57    Family History  Problem Relation Age of Onset   Alcohol abuse Maternal Grandfather        liver failure   Heart disease Paternal Grandfather        "massive MI"   Hyperlipidemia Paternal Grandfather    Heart attack Paternal Grandfather    Stroke Paternal Grandmother    Hyperlipidemia Paternal Grandmother    Alcohol abuse Mother    Social History   Socioeconomic History   Marital status: Divorced    Spouse name: Not on file   Number of children: 4   Years of education: Not on file   Highest education level: Not on file  Occupational History   Occupation: Art therapist  Tobacco Use   Smoking status: Former    Packs/day: 0.50    Years: 10.00    Pack years: 5.00    Types: Cigarettes    Quit date: 04/19/2017    Years since quitting: 4.1   Smokeless tobacco: Never  Vaping Use   Vaping Use: Never used  Substance and Sexual Activity   Alcohol use: Yes    Comment: social   Drug use: No   Sexual activity: Yes  Other Topics Concern   Not on file  Social History Narrative   Not on file   Social Determinants of Health   Financial Resource Strain: Not on file  Food Insecurity: Not on file  Transportation Needs: Not on file  Physical Activity: Not on file  Stress: Not on file  Social Connections: Not on file    Review of Systems  Constitutional:  Negative for chills, fatigue and fever.  HENT:  Negative for congestion, ear pain, rhinorrhea and sore throat.   Respiratory:  Negative for cough and shortness of breath.   Cardiovascular:  Negative for chest pain.  Gastrointestinal:  Negative for abdominal pain, constipation, diarrhea, nausea and vomiting.  Genitourinary:  Negative for dysuria and urgency.  Musculoskeletal:  Negative for back pain and myalgias.  Neurological:  Negative for dizziness, weakness, light-headedness and headaches.  Psychiatric/Behavioral:  Negative for dysphoric  mood. The patient is not nervous/anxious.     Objective:  BP 132/80    Pulse (!) 113    Temp (!) 96.7 F (35.9 C)    Ht 5\' 3"  (1.6 m)    Wt 146 lb (66.2 kg)    SpO2 99%    BMI 25.86 kg/m   BP/Weight 06/09/2021 [id number]  Systolic BP [id number] -  Diastolic BP 80 82 -  Wt. (Lbs) 146 151 155  BMI 25.86 26.75 27.46    Physical Exam  Diabetic Foot Exam - Simple   No data filed      Lab Results  Component Value Date   WBC 8.0 03/24/2021   HGB 14.4 03/24/2021   HCT 42.3  03/24/2021   PLT 385 03/24/2021   GLUCOSE 99 03/24/2021   CHOL 154 12/10/2020   TRIG 103 12/10/2020   HDL 30 (L) 12/10/2020   LDLCALC 105 (H) 12/10/2020   ALT 30 03/24/2021   AST 22 03/24/2021   NA 141 03/24/2021   K 4.3 03/24/2021   CL 101 03/24/2021   CREATININE 0.79 03/24/2021   BUN 9 03/24/2021   CO2 25 03/24/2021   TSH 1.240 03/24/2021      Assessment & Plan:   Problem List Items Addressed This Visit   None .  No orders of the defined types were placed in this encounter.   No orders of the defined types were placed in this encounter.    Follow-up: No follow-ups on file.  An After Visit Summary was printed and given to the patient.  Rip Harbour, NP [place] [phone]

## 2021-06-10 ENCOUNTER — Encounter: Payer: Self-pay | Admitting: Nurse Practitioner

## 2021-06-11 ENCOUNTER — Other Ambulatory Visit: Payer: Self-pay | Admitting: Nurse Practitioner

## 2021-06-11 ENCOUNTER — Encounter: Payer: Self-pay | Admitting: Nurse Practitioner

## 2021-06-15 ENCOUNTER — Other Ambulatory Visit (HOSPITAL_COMMUNITY): Payer: Self-pay

## 2021-06-15 ENCOUNTER — Other Ambulatory Visit: Payer: Self-pay | Admitting: Nurse Practitioner

## 2021-06-15 DIAGNOSIS — F909 Attention-deficit hyperactivity disorder, unspecified type: Secondary | ICD-10-CM

## 2021-06-15 MED ORDER — AMPHETAMINE-DEXTROAMPHETAMINE 10 MG PO TABS
20.0000 mg | ORAL_TABLET | Freq: Two times a day (BID) | ORAL | 0 refills | Status: DC
  Filled 2021-06-15: qty 90, 30d supply, fill #0

## 2021-06-18 ENCOUNTER — Encounter: Payer: Self-pay | Admitting: Nurse Practitioner

## 2021-06-23 ENCOUNTER — Ambulatory Visit: Payer: 59 | Admitting: Nurse Practitioner

## 2021-06-25 ENCOUNTER — Other Ambulatory Visit: Payer: Self-pay | Admitting: Nurse Practitioner

## 2021-06-25 ENCOUNTER — Other Ambulatory Visit (HOSPITAL_COMMUNITY): Payer: Self-pay

## 2021-06-25 ENCOUNTER — Encounter: Payer: Self-pay | Admitting: Nurse Practitioner

## 2021-06-25 DIAGNOSIS — E559 Vitamin D deficiency, unspecified: Secondary | ICD-10-CM

## 2021-06-25 DIAGNOSIS — F411 Generalized anxiety disorder: Secondary | ICD-10-CM

## 2021-06-25 MED ORDER — VITAMIN D (ERGOCALCIFEROL) 1.25 MG (50000 UNIT) PO CAPS
50000.0000 [IU] | ORAL_CAPSULE | ORAL | 2 refills | Status: DC
  Filled 2021-06-25: qty 12, 84d supply, fill #0
  Filled 2021-09-28: qty 3, 21d supply, fill #1

## 2021-06-25 MED ORDER — SERTRALINE HCL 50 MG PO TABS
50.0000 mg | ORAL_TABLET | Freq: Every day | ORAL | 3 refills | Status: DC
  Filled 2021-06-25: qty 90, 90d supply, fill #0
  Filled 2021-09-28: qty 90, 90d supply, fill #1
  Filled 2021-12-23: qty 90, 90d supply, fill #2
  Filled 2022-04-08: qty 90, 90d supply, fill #3

## 2021-06-29 ENCOUNTER — Other Ambulatory Visit (HOSPITAL_COMMUNITY): Payer: Self-pay

## 2021-06-29 ENCOUNTER — Other Ambulatory Visit: Payer: Self-pay | Admitting: Nurse Practitioner

## 2021-06-29 DIAGNOSIS — E669 Obesity, unspecified: Secondary | ICD-10-CM

## 2021-06-29 MED ORDER — SEMAGLUTIDE-WEIGHT MANAGEMENT 1.7 MG/0.75ML ~~LOC~~ SOAJ
1.7000 mg | SUBCUTANEOUS | 0 refills | Status: DC
  Filled 2021-06-29: qty 3, 28d supply, fill #0

## 2021-06-29 MED ORDER — SEMAGLUTIDE-WEIGHT MANAGEMENT 0.25 MG/0.5ML ~~LOC~~ SOAJ
0.5000 mg | SUBCUTANEOUS | 0 refills | Status: DC
  Filled 2021-06-29: qty 2, 28d supply, fill #0

## 2021-06-29 MED ORDER — SEMAGLUTIDE-WEIGHT MANAGEMENT 0.5 MG/0.5ML ~~LOC~~ SOAJ
0.5000 mg | SUBCUTANEOUS | 0 refills | Status: AC
  Filled 2021-06-29 – 2021-07-20 (×2): qty 2, 28d supply, fill #0

## 2021-06-29 MED ORDER — SEMAGLUTIDE-WEIGHT MANAGEMENT 2.4 MG/0.75ML ~~LOC~~ SOAJ
2.4000 mg | SUBCUTANEOUS | 1 refills | Status: DC
  Filled 2021-06-29: qty 3, fill #0

## 2021-07-08 ENCOUNTER — Telehealth: Payer: 59 | Admitting: Nurse Practitioner

## 2021-07-08 NOTE — Progress Notes (Signed)
? ?Virtual Visit via Telephone Note  ? ?This visit type was conducted due to national recommendations for restrictions regarding the COVID-19 Pandemic (e.g. social distancing) in an effort to limit this patient's exposure and mitigate transmission in our community.  Due to her co-morbid illnesses, this patient is at least at moderate risk for complications without adequate follow up.  This format is felt to be most appropriate for this patient at this time.  The patient did not have access to video technology/had technical difficulties with video requiring transitioning to audio format only (telephone).  All issues noted in this document were discussed and addressed.  No physical exam could be performed with this format.  Patient verbally consented to a telehealth visit.  ? ?Date:  07/10/2021  ? ?ID:  Mervin Kung, DOB Sep 11, 1985, MRN [id number] ? ?Patient Location: Other:  work ?Provider Location: Office/Clinic ? ?PCP:  Rip Harbour, NP  ? ?Evaluation Performed:  Follow-Up Visit ? ?Chief Complaint:  Anxiety ? ?History of Present Illness:   ? ?Kaitlyn Sawyer is a 36 y.o. female with Anxiety, Follow-up ? ?She was last seen for anxiety 4 weeks ago. ?Changes made at last visit include started Buspar 10 mg BID. ?  ?She reports good compliance with treatment. ?She reports good tolerance of treatment. ?She is not having side effects.  ? ?She feels her anxiety is mild and Improved since last visit. ? ?Symptoms: ?No chest pain No difficulty concentrating  ?No dizziness No fatigue  ?No feelings of losing control No insomnia  ?No irritable No palpitations  ?No panic attacks No racing thoughts  ?No shortness of breath No sweating  ?No tremors/shakes   ? ?GAD-7 Results ? ?  07/10/2021  ? 10:01 AM 06/09/2021  ?  8:51 AM 03/24/2021  ?  3:31 PM  ?GAD-7 Generalized Anxiety Disorder Screening Tool  ?1. Feeling Nervous, Anxious, or on Edge 0 1 1  ?2. Not Being Able to Stop or Control Worrying 0 0 0  ?3. Worrying Too Much  About Different Things 0 1 0  ?4. Trouble Relaxing 0 0 0  ?5. Being So Restless it's Hard To Sit Still 0 0 0  ?6. Becoming Easily Annoyed or Irritable 0 3 1  ?7. Feeling Afraid As If Something Awful Might Happen 0 0 1  ?Total GAD-7 Score 0 5 3  ?Difficulty At Work, Home, or Getting  Along With Others? Not difficult at all Not difficult at all Not difficult at all  ? ? ?PHQ-9 Scores ? ?  06/09/2021  ?  8:53 AM 03/24/2021  ?  3:27 PM 11/28/2020  ?  8:03 AM  ?PHQ9 SCORE ONLY  ?PHQ-9 Total Score 3 2 4   ? ? ? ?The patient does not have symptoms concerning for COVID-19 infection (fever, chills, cough, or new shortness of breath).  ? ? ?Past Medical History:  ?Diagnosis Date  ? Heart murmur   ? asymptomatic with exception during pregancy notice occ. palpitations  ? History of abnormal cervical Pap smear   ? HPV-- S/P CRYOTHERPY  ? History of gestational hypertension   ? DUE TO ECLAMPSIA  ? History of postpartum depression   ? History of seizure   ? x1  secondary to preclampsia 2011 (approx)  none since  ? ? ?Past Surgical History:  ?Procedure Laterality Date  ? DILATION AND CURETTAGE OF UTERUS    ? post partem retained placenta  ? LAPAROSCOPIC TUBAL LIGATION Bilateral 01/23/2015  ? Procedure: LAPAROSCOPIC TUBAL LIGATION;  Surgeon:  Dian Queen, MD;  Location: [facility];  Service: Gynecology;  Laterality: Bilateral;  ? uterine ablasion  01/06/2017  ? WISDOM TOOTH EXTRACTION  age 63  ? ? ?Family History  ?Problem Relation Age of Onset  ? Alcohol abuse Maternal Grandfather   ?     liver failure  ? Heart disease Paternal Grandfather   ?     "massive MI"  ? Hyperlipidemia Paternal Grandfather   ? Heart attack Paternal Grandfather   ? Stroke Paternal Grandmother   ? Hyperlipidemia Paternal Grandmother   ? Alcohol abuse Mother   ? ? ?Social History  ? ?Socioeconomic History  ? Marital status: Divorced  ?  Spouse name: Not on file  ? Number of children: 4  ? Years of education: Not on file  ? Highest education  level: Not on file  ?Occupational History  ? Occupation: Art therapist  ?Tobacco Use  ? Smoking status: Former  ?  Packs/day: 0.50  ?  Years: 10.00  ?  Pack years: 5.00  ?  Types: Cigarettes  ?  Quit date: 04/19/2017  ?  Years since quitting: 4.2  ? Smokeless tobacco: Never  ?Vaping Use  ? Vaping Use: Never used  ?Substance and Sexual Activity  ? Alcohol use: Yes  ?  Comment: social  ? Drug use: No  ? Sexual activity: Yes  ?Other Topics Concern  ? Not on file  ?Social History Narrative  ? Not on file  ? ?Social Determinants of Health  ? ?Financial Resource Strain: Not on file  ?Food Insecurity: Not on file  ?Transportation Needs: Not on file  ?Physical Activity: Not on file  ?Stress: Not on file  ?Social Connections: Not on file  ?Intimate Partner Violence: Not on file  ? ? ?Outpatient Medications Prior to Visit  ?Medication Sig Dispense Refill  ? amphetamine-dextroamphetamine (ADDERALL) 10 MG tablet Take 2 tablets by mouth every morning and 1 tablet by mouth every afternoon 90 tablet 0  ? ondansetron (ZOFRAN) 4 MG tablet Take 1 tablet (4 mg total) by mouth daily as needed for nausea or vomiting. 30 tablet 1  ? [START ON 07/28/2021] Semaglutide-Weight Management 0.5 MG/0.5ML SOAJ Inject 0.5 mg into the skin once a week for 28 days. 2 mL 0  ? [START ON 09/24/2021] Semaglutide-Weight Management 1.7 MG/0.75ML SOAJ Inject 1.7 mg into the skin once a week for 28 days. 3 mL 0  ? [START ON 10/23/2021] Semaglutide-Weight Management 2.4 MG/0.75ML SOAJ Inject 2.4 mg into the skin once a week for 28 days. 3 mL 1  ? sertraline (ZOLOFT) 50 MG tablet Take 1 tablet (50 mg total) by mouth daily. 90 tablet 3  ? Vitamin D, Ergocalciferol, (DRISDOL) 1.25 MG (50000 UNIT) CAPS capsule Take 1 capsule by mouth every 7 days. 5 capsule 2  ? busPIRone (BUSPAR) 10 MG tablet Take 1 tablet by mouth 2 (two) times daily. (Patient not taking: Reported on 07/10/2021) 60 tablet 3  ? fluticasone (FLONASE) 50 MCG/ACT nasal spray Place 2 sprays into both  nostrils daily. (Patient not taking: Reported on 07/10/2021) 16 g 6  ? pantoprazole (PROTONIX) 40 MG tablet Take 1 tablet (40 mg total) by mouth daily. (Patient not taking: Reported on 07/10/2021) 90 tablet 3  ? triamcinolone cream (KENALOG) 0.1 % Apply 1 topically 2 (two) times daily. (Patient not taking: Reported on 07/10/2021) 30 g 0  ? Semaglutide-Weight Management 0.25 MG/0.5ML SOAJ Inject 0.25 mg into the skin once a week for 28 days. 2  mL 0  ? ?No facility-administered medications prior to visit.  ? ? ?Allergies:   Trintellix [vortioxetine], Diclegis [doxylamine-pyridoxine], and Penicillins  ? ?Social History  ? ?Tobacco Use  ? Smoking status: Former  ?  Packs/day: 0.50  ?  Years: 10.00  ?  Pack years: 5.00  ?  Types: Cigarettes  ?  Quit date: 04/19/2017  ?  Years since quitting: 4.2  ? Smokeless tobacco: Never  ?Vaping Use  ? Vaping Use: Never used  ?Substance Use Topics  ? Alcohol use: Yes  ?  Comment: social  ? Drug use: No  ?  ? ?Review of Systems  ?Constitutional:  Negative for chills, diaphoresis and malaise/fatigue.  ?HENT:  Negative for congestion, ear pain and sore throat.   ?Respiratory:  Negative for cough, shortness of breath and wheezing.   ?Cardiovascular:  Negative for chest pain, palpitations and leg swelling.  ?Gastrointestinal:  Negative for abdominal pain.  ?Genitourinary:  Negative for dysuria.  ?Musculoskeletal:  Negative for myalgias.  ?Neurological:  Negative for dizziness, weakness and headaches.  ?Psychiatric/Behavioral:  Negative for depression. The patient is nervous/anxious.   ?All other systems reviewed and are negative.  ? ?Labs/Other Tests and Data Reviewed:   ? ?Recent Labs: ?03/24/2021: ALT 30; BUN 9; Creatinine, Ser 0.79; Hemoglobin 14.4; Platelets 385; Potassium 4.3; Sodium 141; TSH 1.240  ? ?Recent Lipid Panel ?Lab Results  ?Component Value Date/Time  ? CHOL 154 12/10/2020 07:49 AM  ? TRIG 103 12/10/2020 07:49 AM  ? HDL 30 (L) 12/10/2020 07:49 AM  ? CHOLHDL 5.1 (H) 12/10/2020  07:49 AM  ? LDLCALC 105 (H) 12/10/2020 07:49 AM  ? ? ?Wt Readings from Last 3 Encounters:  ?07/10/21 145 lb (65.8 kg)  ?06/09/21 146 lb (66.2 kg)  ?05/21/21 151 lb (68.5 kg)  ?  ? ?Objective:   ? ?Vital Signs:

## 2021-07-10 ENCOUNTER — Telehealth (INDEPENDENT_AMBULATORY_CARE_PROVIDER_SITE_OTHER): Payer: 59 | Admitting: Nurse Practitioner

## 2021-07-10 ENCOUNTER — Encounter: Payer: Self-pay | Admitting: Nurse Practitioner

## 2021-07-10 VITALS — Ht 63.0 in | Wt 145.0 lb

## 2021-07-10 DIAGNOSIS — F411 Generalized anxiety disorder: Secondary | ICD-10-CM

## 2021-07-11 ENCOUNTER — Other Ambulatory Visit (HOSPITAL_COMMUNITY): Payer: Self-pay

## 2021-07-13 ENCOUNTER — Other Ambulatory Visit: Payer: Self-pay | Admitting: Nurse Practitioner

## 2021-07-13 DIAGNOSIS — F909 Attention-deficit hyperactivity disorder, unspecified type: Secondary | ICD-10-CM

## 2021-07-13 MED ORDER — AMPHETAMINE-DEXTROAMPHETAMINE 10 MG PO TABS
20.0000 mg | ORAL_TABLET | Freq: Two times a day (BID) | ORAL | 0 refills | Status: DC
  Filled 2021-07-13: qty 90, 23d supply, fill #0

## 2021-07-14 ENCOUNTER — Other Ambulatory Visit (HOSPITAL_COMMUNITY): Payer: Self-pay

## 2021-07-17 ENCOUNTER — Other Ambulatory Visit (HOSPITAL_COMMUNITY): Payer: Self-pay

## 2021-07-20 ENCOUNTER — Other Ambulatory Visit (HOSPITAL_COMMUNITY): Payer: Self-pay

## 2021-07-20 ENCOUNTER — Encounter: Payer: Self-pay | Admitting: Nurse Practitioner

## 2021-07-23 ENCOUNTER — Other Ambulatory Visit (HOSPITAL_COMMUNITY): Payer: Self-pay

## 2021-07-25 ENCOUNTER — Other Ambulatory Visit (HOSPITAL_COMMUNITY): Payer: Self-pay

## 2021-07-28 ENCOUNTER — Encounter: Payer: Self-pay | Admitting: Nurse Practitioner

## 2021-07-28 ENCOUNTER — Telehealth: Payer: Self-pay

## 2021-07-28 ENCOUNTER — Telehealth: Payer: Self-pay | Admitting: Nurse Practitioner

## 2021-07-28 NOTE — Telephone Encounter (Signed)
Patient aware that e-appeal was denied, appeal letter with same information provided in e-appeal submitted. Stating patient has adjusted her diet and exercise. Waiting for approval or denial. ?

## 2021-07-28 NOTE — Telephone Encounter (Signed)
Appeal for [facility] denied per covermymeds "This request has not been approved. A written notification letter will follow with additional details." Patient is aware of this outcome and plans to contact insurance regarding this matter. ?

## 2021-07-29 ENCOUNTER — Other Ambulatory Visit (HOSPITAL_COMMUNITY): Payer: Self-pay

## 2021-07-30 ENCOUNTER — Encounter: Payer: Self-pay | Admitting: Nurse Practitioner

## 2021-08-03 ENCOUNTER — Other Ambulatory Visit (HOSPITAL_COMMUNITY): Payer: Self-pay

## 2021-08-03 ENCOUNTER — Telehealth: Payer: Self-pay

## 2021-08-03 NOTE — Telephone Encounter (Signed)
Patient requesting update on PA for wegovy. She believes insurance is missing information.  ? ?Attempted to find PA on covermymeds, but do not have access. She is aware Florentina Addison and Carollee Herter are out of the office. Would like an update when they return.  ? ?Terrill Mohr 08/03/21 9:02 AM ? ?

## 2021-08-06 ENCOUNTER — Encounter: Payer: Self-pay | Admitting: Nurse Practitioner

## 2021-08-07 DIAGNOSIS — Z6825 Body mass index (BMI) 25.0-25.9, adult: Secondary | ICD-10-CM | POA: Diagnosis not present

## 2021-08-07 DIAGNOSIS — Z01419 Encounter for gynecological examination (general) (routine) without abnormal findings: Secondary | ICD-10-CM | POA: Diagnosis not present

## 2021-08-07 DIAGNOSIS — K760 Fatty (change of) liver, not elsewhere classified: Secondary | ICD-10-CM | POA: Diagnosis not present

## 2021-08-07 DIAGNOSIS — Z124 Encounter for screening for malignant neoplasm of cervix: Secondary | ICD-10-CM | POA: Diagnosis not present

## 2021-08-14 ENCOUNTER — Other Ambulatory Visit: Payer: Self-pay | Admitting: Nurse Practitioner

## 2021-08-14 ENCOUNTER — Other Ambulatory Visit (HOSPITAL_COMMUNITY): Payer: Self-pay

## 2021-08-14 DIAGNOSIS — F909 Attention-deficit hyperactivity disorder, unspecified type: Secondary | ICD-10-CM

## 2021-08-14 MED ORDER — AMPHETAMINE-DEXTROAMPHETAMINE 10 MG PO TABS
20.0000 mg | ORAL_TABLET | Freq: Two times a day (BID) | ORAL | 0 refills | Status: DC
  Filled 2021-08-14: qty 90, 30d supply, fill #0

## 2021-08-24 ENCOUNTER — Encounter: Payer: Self-pay | Admitting: Nurse Practitioner

## 2021-09-15 ENCOUNTER — Encounter: Payer: Self-pay | Admitting: Nurse Practitioner

## 2021-09-16 ENCOUNTER — Other Ambulatory Visit (HOSPITAL_COMMUNITY): Payer: Self-pay

## 2021-09-16 ENCOUNTER — Encounter: Payer: Self-pay | Admitting: Nurse Practitioner

## 2021-09-16 ENCOUNTER — Other Ambulatory Visit: Payer: Self-pay | Admitting: Nurse Practitioner

## 2021-09-16 DIAGNOSIS — F909 Attention-deficit hyperactivity disorder, unspecified type: Secondary | ICD-10-CM

## 2021-09-16 MED ORDER — AMPHETAMINE-DEXTROAMPHETAMINE 10 MG PO TABS
20.0000 mg | ORAL_TABLET | Freq: Two times a day (BID) | ORAL | 0 refills | Status: DC
  Filled 2021-09-16: qty 90, 30d supply, fill #0

## 2021-09-17 ENCOUNTER — Other Ambulatory Visit (HOSPITAL_COMMUNITY): Payer: Self-pay

## 2021-09-21 ENCOUNTER — Encounter: Payer: Self-pay | Admitting: Nurse Practitioner

## 2021-09-24 ENCOUNTER — Other Ambulatory Visit: Payer: Self-pay | Admitting: Nurse Practitioner

## 2021-09-24 DIAGNOSIS — E6609 Other obesity due to excess calories: Secondary | ICD-10-CM

## 2021-09-24 MED ORDER — SAXENDA 18 MG/3ML ~~LOC~~ SOPN
PEN_INJECTOR | SUBCUTANEOUS | 0 refills | Status: AC
Start: 2021-09-24 — End: 2021-10-29
  Filled 2021-09-24: qty 10.5, 35d supply, fill #0
  Filled 2021-09-25: qty 9, 32d supply, fill #0

## 2021-09-25 ENCOUNTER — Other Ambulatory Visit (HOSPITAL_COMMUNITY): Payer: Self-pay

## 2021-09-28 ENCOUNTER — Other Ambulatory Visit (HOSPITAL_COMMUNITY): Payer: Self-pay

## 2021-10-02 ENCOUNTER — Other Ambulatory Visit (HOSPITAL_COMMUNITY): Payer: Self-pay

## 2021-10-05 ENCOUNTER — Other Ambulatory Visit (HOSPITAL_COMMUNITY): Payer: Self-pay

## 2021-10-13 ENCOUNTER — Encounter: Payer: Self-pay | Admitting: Nurse Practitioner

## 2021-10-16 ENCOUNTER — Other Ambulatory Visit (HOSPITAL_COMMUNITY): Payer: Self-pay

## 2021-10-16 ENCOUNTER — Other Ambulatory Visit: Payer: Self-pay | Admitting: Nurse Practitioner

## 2021-10-16 DIAGNOSIS — E559 Vitamin D deficiency, unspecified: Secondary | ICD-10-CM

## 2021-10-16 DIAGNOSIS — F909 Attention-deficit hyperactivity disorder, unspecified type: Secondary | ICD-10-CM

## 2021-10-18 MED ORDER — AMPHETAMINE-DEXTROAMPHETAMINE 10 MG PO TABS
20.0000 mg | ORAL_TABLET | Freq: Two times a day (BID) | ORAL | 0 refills | Status: DC
  Filled 2021-10-18: qty 90, 30d supply, fill #0

## 2021-10-18 MED ORDER — VITAMIN D (ERGOCALCIFEROL) 1.25 MG (50000 UNIT) PO CAPS
50000.0000 [IU] | ORAL_CAPSULE | ORAL | 2 refills | Status: DC
  Filled 2021-10-18: qty 4, 28d supply, fill #0
  Filled 2021-11-28: qty 4, 28d supply, fill #1
  Filled 2021-12-23: qty 4, 28d supply, fill #2
  Filled 2022-01-25: qty 3, 21d supply, fill #3

## 2021-10-19 ENCOUNTER — Other Ambulatory Visit (HOSPITAL_COMMUNITY): Payer: Self-pay

## 2021-10-29 ENCOUNTER — Other Ambulatory Visit (HOSPITAL_COMMUNITY): Payer: Self-pay

## 2021-10-29 ENCOUNTER — Telehealth: Payer: 59 | Admitting: Physician Assistant

## 2021-10-29 DIAGNOSIS — R3989 Other symptoms and signs involving the genitourinary system: Secondary | ICD-10-CM | POA: Diagnosis not present

## 2021-10-29 MED ORDER — NITROFURANTOIN MONOHYD MACRO 100 MG PO CAPS
100.0000 mg | ORAL_CAPSULE | Freq: Two times a day (BID) | ORAL | 0 refills | Status: DC
  Filled 2021-10-29: qty 10, 5d supply, fill #0

## 2021-10-29 NOTE — Progress Notes (Signed)

## 2021-10-30 ENCOUNTER — Other Ambulatory Visit (HOSPITAL_COMMUNITY): Payer: Self-pay

## 2021-11-03 ENCOUNTER — Encounter: Payer: Self-pay | Admitting: Nurse Practitioner

## 2021-11-03 NOTE — Progress Notes (Signed)
Subjective:  Patient ID: Kaitlyn Sawyer, female    DOB: 11-15-1985  Age: 36 y.o. MRN: [id number]  Chief Complaint  Patient presents with   Wt Management    HPI   Pt presents for follow-up of depression, Vit D deficiency, and weight management. She had previously been prescribed Semaglutide for weight management.She is no longer taking medication. She has modified diet and increased physical activity. States she has gained weight after taking Zoloft. Current weight 155 lbs, BMI 27.44.   Vitamin D deficiency, follow-up  Lab Results  Component Value Date   VD25OH 25.6 (L) 03/24/2021   CALCIUM 10.1 03/24/2021   CALCIUM 9.6 09/09/2020   Wt Readings from Last 3 Encounters:  11/05/21 150 lb (68 kg)  07/10/21 145 lb (65.8 kg)  06/09/21 146 lb (66.2 kg)    She was last seen for vitamin D deficiency 4 months ago.  Management since that visit includes Vit D 50, 000 U weekly. She reports excellent compliance with treatment. She is not having side effects.     Depression, Follow-up  She  was last seen for this 4 months ago. Current treatment includes Zoloft 50 mg. She reports good compliance with treatment. She is not having side effects.  She reports good tolerance of treatment. Current symptoms include: fatigue She feels she is Unchanged since last visit.     11/05/2021    4:02 PM 06/09/2021    8:53 AM 03/24/2021    3:27 PM  Depression screen PHQ 2/9  Decreased Interest 0 0 0  Down, Depressed, Hopeless 1 0 0  PHQ - 2 Score 1 0 0  Altered sleeping 1 1 0  Tired, decreased energy 3 1 2   Change in appetite 0 0 0  Feeling bad or failure about yourself  0 0 0  Trouble concentrating 1 0 0  Moving slowly or fidgety/restless 0 1 0  Suicidal thoughts 0 0 0  PHQ-9 Score 6 3 2   Difficult doing work/chores Not difficult at all Not difficult at all Not difficult at all         Current Outpatient Medications on File Prior to Visit  Medication Sig Dispense Refill    amphetamine-dextroamphetamine (ADDERALL) 10 MG tablet Take 2 tablets by mouth every morning and 1 tablet by mouth every afternoon 90 tablet 0   Liraglutide -Weight Management (SAXENDA) 18 MG/3ML SOPN Inject 0.6 mg into the skin daily for 7 days, THEN 1.2 mg daily for 7 days, THEN 1.8 mg daily for 7 days, THEN 2.4 mg daily for 7 days, THEN 3 mg daily for 7 days. 10.5 mL 0   nitrofurantoin, macrocrystal-monohydrate, (MACROBID) 100 MG capsule Take 1 capsule by mouth 2 times daily. 10 capsule 0   ondansetron (ZOFRAN) 4 MG tablet Take 1 tablet (4 mg total) by mouth daily as needed for nausea or vomiting. 30 tablet 1   sertraline (ZOLOFT) 50 MG tablet Take 1 tablet (50 mg total) by mouth daily. 90 tablet 3   Vitamin D, Ergocalciferol, (DRISDOL) 1.25 MG (50000 UNIT) CAPS capsule Take 1 capsule by mouth every 7 days. 5 capsule 2   No current facility-administered medications on file prior to visit.   Past Medical History:  Diagnosis Date   Heart murmur    asymptomatic with exception during pregancy notice occ. palpitations   History of abnormal cervical Pap smear    HPV-- S/P CRYOTHERPY   History of gestational hypertension    DUE TO ECLAMPSIA   History of postpartum  depression    History of seizure    x1  secondary to preclampsia 2011 (approx)  none since   Past Surgical History:  Procedure Laterality Date   DILATION AND CURETTAGE OF UTERUS     post partem retained placenta   LAPAROSCOPIC TUBAL LIGATION Bilateral 01/23/2015   Procedure: LAPAROSCOPIC TUBAL LIGATION;  Surgeon: Marcelle Overlie, MD;  Location: [facility];  Service: Gynecology;  Laterality: Bilateral;   uterine ablasion  01/06/2017   WISDOM TOOTH EXTRACTION  age 62    Family History  Problem Relation Age of Onset   Alcohol abuse Maternal Grandfather        liver failure   Heart disease Paternal Grandfather        "massive MI"   Hyperlipidemia Paternal Grandfather    Heart attack Paternal Grandfather    Stroke  Paternal Grandmother    Hyperlipidemia Paternal Grandmother    Alcohol abuse Mother    Social History   Socioeconomic History   Marital status: Divorced    Spouse name: Not on file   Number of children: 4   Years of education: Not on file   Highest education level: Not on file  Occupational History   Occupation: Sales executive  Tobacco Use   Smoking status: Former    Packs/day: 0.50    Years: 10.00    Total pack years: 5.00    Types: Cigarettes    Quit date: 04/19/2017    Years since quitting: 4.5   Smokeless tobacco: Never  Vaping Use   Vaping Use: Never used  Substance and Sexual Activity   Alcohol use: Yes    Comment: social   Drug use: No   Sexual activity: Yes  Other Topics Concern   Not on file  Social History Narrative   Not on file   Social Determinants of Health   Financial Resource Strain: Not on file  Food Insecurity: Not on file  Transportation Needs: Not on file  Physical Activity: Not on file  Stress: Not on file  Social Connections: Not on file    Review of Systems  Constitutional:  Positive for fatigue and unexpected weight change (weight gain). Negative for chills and fever.  HENT:  Negative for congestion, ear pain, rhinorrhea and sore throat.   Respiratory:  Negative for cough and shortness of breath.   Cardiovascular:  Negative for chest pain.  Gastrointestinal:  Negative for abdominal pain, constipation, diarrhea, nausea and vomiting.  Genitourinary:  Negative for dysuria and urgency.  Musculoskeletal:  Negative for back pain and myalgias.  Neurological:  Negative for dizziness, weakness, light-headedness and headaches.  Psychiatric/Behavioral:  Negative for dysphoric mood. The patient is not nervous/anxious.      Objective:  BP 116/78   Pulse 77   Temp (!) 97.1 F (36.2 C)   Ht 5\' 2"  (1.575 m)   Wt 150 lb (68 kg)   SpO2 99%   BMI 27.44 kg/m       07/10/2021    9:59 AM 06/09/2021    8:44 AM 05/21/2021    2:41 PM  BP/Weight   Systolic BP  132 07/19/2021  Diastolic BP  80 82  Wt. (Lbs) 145 146 151  BMI 25.69 kg/m2 25.86 kg/m2 26.75 kg/m2    Physical Exam Vitals reviewed.  Constitutional:      Appearance: Normal appearance.  Cardiovascular:     Rate and Rhythm: Normal rate and regular rhythm.     Pulses: Normal pulses.     Heart  sounds: Normal heart sounds.  Skin:    Capillary Refill: Capillary refill takes less than 2 seconds.  Neurological:     General: No focal deficit present.     Mental Status: She is alert and oriented to person, place, and time.  Psychiatric:        Mood and Affect: Mood normal.        Behavior: Behavior normal.    Lab Results  Component Value Date   WBC 8.0 03/24/2021   HGB 14.4 03/24/2021   HCT 42.3 03/24/2021   PLT 385 03/24/2021   GLUCOSE 99 03/24/2021   CHOL 154 12/10/2020   TRIG 103 12/10/2020   HDL 30 (L) 12/10/2020   LDLCALC 105 (H) 12/10/2020   ALT 30 03/24/2021   AST 22 03/24/2021   NA 141 03/24/2021   K 4.3 03/24/2021   CL 101 03/24/2021   CREATININE 0.79 03/24/2021   BUN 9 03/24/2021   CO2 25 03/24/2021   TSH 1.240 03/24/2021      Assessment & Plan:   1. Vitamin D deficiency - VITAMIN D 25 Hydroxy (Vit-D Deficiency, Fractures)  2. Depression, recurrent ([place]) - CBC with Differential/Platelet - Comprehensive metabolic panel - TSH  3. Overweight (BMI 25.0-29.9) - CBC with Differential/Platelet - Comprehensive metabolic panel - TSH - VITAMIN D 25 Hydroxy (Vit-D Deficiency, Fractures) - Semaglutide-Weight Management 0.25 MG/0.5ML SOAJ; Inject 0.25 mg into the skin once a week for 28 days.  Dispense: 2 mL; Refill: 0 - Semaglutide-Weight Management 0.5 MG/0.5ML SOAJ; Inject 0.5 mg into the skin once a week for 28 days.  Dispense: 2 mL; Refill: 0 - Semaglutide-Weight Management 1 MG/0.5ML SOAJ; Inject 1 mg into the skin once a week for 28 days.  Dispense: 2 mL; Refill: 0 - Semaglutide-Weight Management 1.7 MG/0.75ML SOAJ; Inject 1.7 mg into the skin  once a week for 28 days.  Dispense: 3 mL; Refill: 0 - Semaglutide-Weight Management 2.4 MG/0.75ML SOAJ; Inject 2.4 mg into the skin once a week for 28 days.  Dispense: 3 mL; Refill: 0  4. BMI 27.0-27.9,adult - CBC with Differential/Platelet - Comprehensive metabolic panel - TSH - VITAMIN D 25 Hydroxy (Vit-D Deficiency, Fractures) - Semaglutide-Weight Management 0.25 MG/0.5ML SOAJ; Inject 0.25 mg into the skin once a week for 28 days.  Dispense: 2 mL; Refill: 0 - Semaglutide-Weight Management 0.5 MG/0.5ML SOAJ; Inject 0.5 mg into the skin once a week for 28 days.  Dispense: 2 mL; Refill: 0 - Semaglutide-Weight Management 1 MG/0.5ML SOAJ; Inject 1 mg into the skin once a week for 28 days.  Dispense: 2 mL; Refill: 0 - Semaglutide-Weight Management 1.7 MG/0.75ML SOAJ; Inject 1.7 mg into the skin once a week for 28 days.  Dispense: 3 mL; Refill: 0 - Semaglutide-Weight Management 2.4 MG/0.75ML SOAJ; Inject 2.4 mg into the skin once a week for 28 days.  Dispense: 3 mL; Refill: 0    We will call you with lab results  Resume Wegovy: Begin Wegovy 0.25 mg injection weekly for 4 weeks, then increase to 0.5 mg injection for 4 weeks, then increase to 1 mg injection weekly for 4 weeks, then increase to 1.7 mg injection weekly for 4 weeks, then increase to 2 .4 mg injection weekly     Follow-up: 33-months  An After Visit Summary was printed and given to the patient.  I, Rip Harbour, NP, have reviewed all documentation for this visit. The documentation on 11/05/21 for the exam, diagnosis, procedures, and orders are all accurate and complete.  Signed, Rip Harbour, NP [place] [phone]

## 2021-11-05 ENCOUNTER — Ambulatory Visit: Payer: 59 | Admitting: Nurse Practitioner

## 2021-11-05 ENCOUNTER — Encounter: Payer: Self-pay | Admitting: Nurse Practitioner

## 2021-11-05 ENCOUNTER — Other Ambulatory Visit (HOSPITAL_COMMUNITY): Payer: Self-pay

## 2021-11-05 VITALS — BP 116/78 | HR 77 | Temp 97.1°F | Ht 62.0 in | Wt 150.0 lb

## 2021-11-05 DIAGNOSIS — E663 Overweight: Secondary | ICD-10-CM

## 2021-11-05 DIAGNOSIS — Z6827 Body mass index (BMI) 27.0-27.9, adult: Secondary | ICD-10-CM

## 2021-11-05 DIAGNOSIS — F339 Major depressive disorder, recurrent, unspecified: Secondary | ICD-10-CM | POA: Diagnosis not present

## 2021-11-05 DIAGNOSIS — E559 Vitamin D deficiency, unspecified: Secondary | ICD-10-CM | POA: Diagnosis not present

## 2021-11-05 MED ORDER — SEMAGLUTIDE-WEIGHT MANAGEMENT 0.5 MG/0.5ML ~~LOC~~ SOAJ
0.5000 mg | SUBCUTANEOUS | 0 refills | Status: AC
  Filled 2021-11-05 – 2021-12-01 (×2): qty 2, 28d supply, fill #0

## 2021-11-05 MED ORDER — SEMAGLUTIDE-WEIGHT MANAGEMENT 1.7 MG/0.75ML ~~LOC~~ SOAJ
1.7000 mg | SUBCUTANEOUS | 0 refills | Status: DC
  Filled 2021-11-05: qty 3, 28d supply, fill #0

## 2021-11-05 MED ORDER — SEMAGLUTIDE-WEIGHT MANAGEMENT 1 MG/0.5ML ~~LOC~~ SOAJ
1.0000 mg | SUBCUTANEOUS | 0 refills | Status: DC
  Filled 2021-11-05 – 2021-12-30 (×2): qty 2, 28d supply, fill #0

## 2021-11-05 MED ORDER — SEMAGLUTIDE-WEIGHT MANAGEMENT 2.4 MG/0.75ML ~~LOC~~ SOAJ
2.4000 mg | SUBCUTANEOUS | 0 refills | Status: DC
  Filled 2021-11-05 – 2022-04-29 (×2): qty 3, 28d supply, fill #0

## 2021-11-05 MED ORDER — SEMAGLUTIDE-WEIGHT MANAGEMENT 0.25 MG/0.5ML ~~LOC~~ SOAJ
0.2500 mg | SUBCUTANEOUS | 0 refills | Status: AC
  Filled 2021-11-05: qty 2, 28d supply, fill #0

## 2021-11-05 NOTE — Patient Instructions (Signed)
We will call you with lab results  Resume Wegovy: Begin Wegovy 0.25 mg injection weekly for 4 weeks, then increase to 0.5 mg injection for 4 weeks, then increase to 1 mg injection weekly for 4 weeks, then increase to 1.7 mg injection weekly for 4 weeks, then increase to 2 .4 mg injection weekly     Vitamin D Deficiency Vitamin D deficiency is when your body does not have enough vitamin D. Vitamin D is important because: It helps your body use certain minerals. It helps to keep your bones healthy. It lessens irritation and swelling (inflammation). It helps the body's defense system (immune system) work better. Not getting enough vitamin D can make your bones soft. What are the causes? Not eating enough foods that have vitamin D in them. Not getting enough sun. Having diseases that make it hard for your body to take in vitamin D. Having had part of your stomach or part of your small intestine taken out. What increases the risk? Being an older adult. Not spending much time outdoors. Living in a long-term care center. Having dark skin. Taking certain medicines. Being overweight or very overweight (obese). Having long-term (chronic) kidney or liver disease. What are the signs or symptoms? In mild cases, there may be no symptoms. If the condition is very bad, symptoms may include: Bone pain. Muscle pain. Not being able to walk normally. Bones that break easily. Joint pain. How is this treated? Treatment may include taking supplements as told by your doctor. Your doctor will tell you what dose is best for you. This may include taking: Vitamin D. Calcium. Follow these instructions at home: Eating and drinking Eat foods that have vitamin D in them, such as: Dairy products, cereals, or juices that have vitamin D added to them (are fortified). Check the label. Fish, such as salmon or trout. Eggs. The vitamin D is in the yolk. Mushrooms that were treated with UV light. Beef  liver. The items listed above may not be a complete list of foods and beverages you can eat and drink. Contact a dietitian for more information. General instructions Take over-the-counter and prescription medicines only as told by your doctor. Take supplements only as told by your doctor. Get sunlight in a safe way. Do not use a tanning bed. Stay at a healthy weight. Lose weight if you need to. Keep all follow-up visits. How is this prevented? Eating foods that naturally have vitamin D in them. Eating or drinking foods and drinks that have vitamin D added to them, such as cereals, juices, and milk. Taking vitamin D or a multivitamin that has vitamin D in it. Being in the sun. Your body makes vitamin D when your skin gets sunlight. Contact a doctor if: Your symptoms do not go away. You feel like you may vomit (nauseous). You vomit. You poop less often than normal, or you have trouble pooping (constipation). Summary Vitamin D deficiency is when your body does not have enough vitamin D. Vitamin D helps to keep your bones healthy. This condition is often treated by taking a supplement. Your doctor will tell you what dose is best for you. This information is not intended to replace advice given to you by your health care provider. Make sure you discuss any questions you have with your health care provider. Document Revised: 01/09/2021 Document Reviewed: 01/09/2021 Elsevier Patient Education  [place] Injection (Weight Management) What is this medication? SEMAGLUTIDE (SEM a GLOO tide) promotes weight  loss. It may also be used to maintain weight loss. It works by decreasing appetite. Changes to diet and exercise are often combined with this medication. This medicine may be used for other purposes; ask your health care provider or pharmacist if you have questions. COMMON BRAND NAME(S): [id number] What should I tell my care team before I take this medication? They  need to know if you have any of these conditions: Endocrine tumors (MEN 2) or if someone in your family had these tumors Eye disease, vision problems Gallbladder disease History of depression or mental health disease History of pancreatitis Kidney disease Stomach or intestine problems Suicidal thoughts, plans, or attempt; a previous suicide attempt by you or a family member Thyroid cancer or if someone in your family had thyroid cancer An unusual or allergic reaction to semaglutide, other medications, foods, dyes, or preservatives Pregnant or trying to get pregnant Breast-feeding How should I use this medication? This medication is injected under the skin. You will be taught how to prepare and give it. Take it as directed on the prescription label. It is given once every week (every 7 days). Keep taking it unless your care team tells you to stop. It is important that you put your used needles and pens in a special sharps container. Do not put them in a trash can. If you do not have a sharps container, call your pharmacist or care team to get one. A special MedGuide will be given to you by the pharmacist with each prescription and refill. Be sure to read this information carefully each time. This medication comes with INSTRUCTIONS FOR USE. Ask your pharmacist for directions on how to use this medication. Read the information carefully. Talk to your pharmacist or care team if you have questions. Talk to your care team about the use of this medication in children. While it may be prescribed for children as young as 12 years for selected conditions, precautions do apply. Overdosage: If you think you have taken too much of this medicine contact a poison control center or emergency room at once. NOTE: This medicine is only for you. Do not share this medicine with others. What if I miss a dose? If you miss a dose and the next scheduled dose is more than 2 days away, take the missed dose as soon as  possible. If you miss a dose and the next scheduled dose is less than 2 days away, do not take the missed dose. Take the next dose at your regular time. Do not take double or extra doses. If you miss your dose for 2 weeks or more, take the next dose at your regular time or call your care team to talk about how to restart this medication. What may interact with this medication? Insulin and other medications for diabetes This list may not describe all possible interactions. Give your health care provider a list of all the medicines, herbs, non-prescription drugs, or dietary supplements you use. Also tell them if you smoke, drink alcohol, or use illegal drugs. Some items may interact with your medicine. What should I watch for while using this medication? Visit your care team for regular checks on your progress. It may be some time before you see the benefit from this medication. Drink plenty of fluids while taking this medication. Check with your care team if you have severe diarrhea, nausea, and vomiting, or if you sweat a lot. The loss of too much body fluid may make it dangerous for you  to take this medication. This medication may affect blood sugar levels. Ask your care team if changes in diet or medications are needed if you have diabetes. If you or your family notice any changes in your behavior, such as new or worsening depression, thoughts of harming yourself, anxiety, other unusual or disturbing thoughts, or memory loss, call your care team right away. Women should inform their care team if they wish to become pregnant or think they might be pregnant. Losing weight while pregnant is not advised and may cause harm to the unborn child. Talk to your care team for more information. What side effects may I notice from receiving this medication? Side effects that you should report to your care team as soon as possible: Allergic reactions--skin rash, itching, hives, swelling of the face, lips, tongue, or  throat Change in vision Dehydration--increased thirst, dry mouth, feeling faint or lightheaded, headache, dark yellow or brown urine Gallbladder problems--severe stomach pain, nausea, vomiting, fever Heart palpitations--rapid, pounding, or irregular heartbeat Kidney injury--decrease in the amount of urine, swelling of the ankles, hands, or feet Pancreatitis--severe stomach pain that spreads to your back or gets worse after eating or when touched, fever, nausea, vomiting Thoughts of suicide or self-harm, worsening mood, feelings of depression Thyroid cancer--new mass or lump in the neck, pain or trouble swallowing, trouble breathing, hoarseness Side effects that usually do not require medical attention (report to your care team if they continue or are bothersome): Diarrhea Loss of appetite Nausea Stomach pain Vomiting This list may not describe all possible side effects. Call your doctor for medical advice about side effects. You may report side effects to FDA at 1-800-FDA-1088. Where should I keep my medication? Keep out of the reach of children and pets. Refrigeration (preferred): Store in the refrigerator. Do not freeze. Keep this medication in the original container until you are ready to take it. Get rid of any unused medication after the expiration date. Room temperature: If needed, prior to cap removal, the pen can be stored at room temperature for up to 28 days. Protect from light. If it is stored at room temperature, get rid of any unused medication after 28 days or after it expires, whichever is first. It is important to get rid of the medication as soon as you no longer need it or it is expired. You can do this in two ways: Take the medication to a medication take-back program. Check with your pharmacy or law enforcement to find a location. If you cannot return the medication, follow the directions in the [place]. NOTE: This sheet is a summary. It may not cover all possible  information. If you have questions about this medicine, talk to your doctor, pharmacist, or health care provider.  2023 Elsevier/Gold Standard (2021-04-22 00:00:00)   Managing Depression, Adult Depression is a mental health condition that affects your thoughts, feelings, and actions. Being diagnosed with depression can bring you relief if you did not know why you have felt or behaved a certain way. It could also leave you feeling overwhelmed with uncertainty about your future. Preparing yourself to manage your symptoms can help you feel more positive about your future. How to manage lifestyle changes Managing stress  Stress is your body's reaction to life changes and events, both good and bad. Stress can add to your feelings of depression. Learning to manage your stress can help lessen your feelings of depression. Try some of the following approaches to reducing your stress (stress reduction techniques): Listen to music  that you enjoy and that inspires you. Try using a meditation app or take a meditation class. Develop a practice that helps you connect with your spiritual self. Walk in nature, pray, or go to a place of worship. Do some deep breathing. To do this, inhale slowly through your nose. Pause at the top of your inhale for a few seconds and then exhale slowly, letting your muscles relax. Practice yoga to help relax and work your muscles. Choose a stress reduction technique that suits your lifestyle and personality. These techniques take time and practice to develop. Set aside 5-15 minutes a day to do them. Therapists can offer training in these techniques. Other things you can do to manage stress include: Keeping a stress diary. Knowing your limits and saying no when you think something is too much. Paying attention to how you react to certain situations. You may not be able to control everything, but you can change your reaction. Adding humor to your life by watching funny films or TV  shows. Making time for activities that you enjoy and that relax you.  Medicines Medicines, such as antidepressants, are often a part of treatment for depression. Talk with your pharmacist or health care provider about all the medicines, supplements, and herbal products that you take, their possible side effects, and what medicines and other products are safe to take together. Make sure to report any side effects you may have to your health care provider. Relationships Your health care provider may suggest family therapy, couples therapy, or individual therapy as part of your treatment. How to recognize changes Everyone responds differently to treatment for depression. As you recover from depression, you may start to: Have more interest in doing activities. Feel less hopeless. Have more energy. Overeat less often, or have a better appetite. Have better mental focus. It is important to recognize if your depression is not getting better or is getting worse. The symptoms you had in the beginning may return, such as: Tiredness (fatigue) or low energy. Eating too much or too little. Sleeping too much or too little. Feeling restless, agitated, or hopeless. Trouble focusing or making decisions. Unexplained physical complaints. Feeling irritable, angry, or aggressive. If you or your family members notice these symptoms coming back, let your health care provider know right away. Follow these instructions at home: Activity  Try to get some form of exercise each day, such as walking, biking, swimming, or lifting weights. Practice stress reduction techniques. Engage your mind by taking a class or doing some volunteer work. Lifestyle Get the right amount and quality of sleep. Cut down on using caffeine, tobacco, alcohol, and other potentially harmful substances. Eat a healthy diet that includes plenty of vegetables, fruits, whole grains, low-fat dairy products, and lean protein. Do not eat a lot  of foods that are high in solid fats, added sugars, or salt (sodium). General instructions Take over-the-counter and prescription medicines only as told by your health care provider. Keep all follow-up visits as told by your health care provider. This is important. Where to find support Talking to others  Friends and family members can be sources of support and guidance. Talk to trusted friends or family members about your condition. Explain your symptoms to them, and let them know that you are working with a health care provider to treat your depression. Tell friends and family members how they also can be helpful. Finances Find appropriate mental health providers that fit with your financial situation. Talk with your health care  provider about options to get reduced prices on your medicines. Where to find more information You can find support in your area from: Anxiety and Depression Association of America (ADAA): www.adaa.org Mental Health America: www.mentalhealthamerica.net [employer] on Mental Illness: www.nami.org Contact a health care provider if: You stop taking your antidepressant medicines, and you have any of these symptoms: Nausea. Headache. Light-headedness. Chills and body aches. Not being able to sleep (insomnia). You or your friends and family think your depression is getting worse. Get help right away if: You have thoughts of hurting yourself or others. If you ever feel like you may hurt yourself or others, or have thoughts about taking your own life, get help right away. Go to your nearest emergency department or: Call your local emergency services (911 in the [place] Call a suicide crisis helpline, such as the National Suicide Prevention Lifeline at (631)881-2293 or 988 in the [place] This is open 24 hours a day in the [place] Text the Crisis Text Line at 339 199 8775 (in the [place] Summary If you are diagnosed with depression, preparing yourself to manage your symptoms  is a good way to feel positive about your future. Work with your health care provider on a management plan that includes stress reduction techniques, medicines (if applicable), therapy, and healthy lifestyle habits. Keep talking with your health care provider about how your treatment is working. If you have thoughts about taking your own life, call a suicide crisis helpline or text a crisis text line. This information is not intended to replace advice given to you by your health care provider. Make sure you discuss any questions you have with your health care provider. Document Revised: 10/29/2020 Document Reviewed: 02/14/2019 Elsevier Patient Education  2023 [employer].

## 2021-11-06 ENCOUNTER — Other Ambulatory Visit (HOSPITAL_COMMUNITY): Payer: Self-pay

## 2021-11-06 LAB — CBC WITH DIFFERENTIAL/PLATELET
Basophils Absolute: 0.1 10*3/uL (ref 0.0–0.2)
Basos: 1 %
EOS (ABSOLUTE): 0.2 10*3/uL (ref 0.0–0.4)
Eos: 3 %
Hematocrit: 37.7 % (ref 34.0–46.6)
Hemoglobin: 13 g/dL (ref 11.1–15.9)
Immature Grans (Abs): 0 10*3/uL (ref 0.0–0.1)
Immature Granulocytes: 0 %
Lymphocytes Absolute: 2.1 10*3/uL (ref 0.7–3.1)
Lymphs: 30 %
MCH: 29.4 pg (ref 26.6–33.0)
MCHC: 34.5 g/dL (ref 31.5–35.7)
MCV: 85 fL (ref 79–97)
Monocytes Absolute: 0.4 10*3/uL (ref 0.1–0.9)
Monocytes: 5 %
Neutrophils Absolute: 4.4 10*3/uL (ref 1.4–7.0)
Neutrophils: 61 %
Platelets: 296 10*3/uL (ref 150–450)
RBC: 4.42 x10E6/uL (ref 3.77–5.28)
RDW: 12.9 % (ref 11.7–15.4)
WBC: 7.2 10*3/uL (ref 3.4–10.8)

## 2021-11-06 LAB — COMPREHENSIVE METABOLIC PANEL
ALT: 14 IU/L (ref 0–32)
AST: 14 IU/L (ref 0–40)
Albumin/Globulin Ratio: 1.8 (ref 1.2–2.2)
Albumin: 4.4 g/dL (ref 3.9–4.9)
Alkaline Phosphatase: 48 IU/L (ref 44–121)
BUN/Creatinine Ratio: 18 (ref 9–23)
BUN: 12 mg/dL (ref 6–20)
Bilirubin Total: 0.3 mg/dL (ref 0.0–1.2)
CO2: 28 mmol/L (ref 20–29)
Calcium: 9.5 mg/dL (ref 8.7–10.2)
Chloride: 101 mmol/L (ref 96–106)
Creatinine, Ser: 0.67 mg/dL (ref 0.57–1.00)
Globulin, Total: 2.4 g/dL (ref 1.5–4.5)
Glucose: 97 mg/dL (ref 70–99)
Potassium: 4.1 mmol/L (ref 3.5–5.2)
Sodium: 139 mmol/L (ref 134–144)
Total Protein: 6.8 g/dL (ref 6.0–8.5)
eGFR: 116 mL/min/{1.73_m2} (ref >59)

## 2021-11-06 LAB — VITAMIN D 25 HYDROXY (VIT D DEFICIENCY, FRACTURES): Vit D, 25-Hydroxy: 78.1 ng/mL (ref 30.0–100.0)

## 2021-11-06 LAB — TSH: TSH: 1.1 u[IU]/mL (ref 0.450–4.500)

## 2021-11-11 ENCOUNTER — Other Ambulatory Visit (HOSPITAL_COMMUNITY): Payer: Self-pay

## 2021-11-18 ENCOUNTER — Encounter: Payer: Self-pay | Admitting: Nurse Practitioner

## 2021-11-20 ENCOUNTER — Other Ambulatory Visit: Payer: Self-pay | Admitting: Nurse Practitioner

## 2021-11-20 ENCOUNTER — Other Ambulatory Visit (HOSPITAL_COMMUNITY): Payer: Self-pay

## 2021-11-20 DIAGNOSIS — F909 Attention-deficit hyperactivity disorder, unspecified type: Secondary | ICD-10-CM

## 2021-11-20 MED ORDER — AMPHETAMINE-DEXTROAMPHETAMINE 10 MG PO TABS
20.0000 mg | ORAL_TABLET | Freq: Two times a day (BID) | ORAL | 0 refills | Status: DC
  Filled 2021-11-20: qty 90, 30d supply, fill #0

## 2021-11-30 ENCOUNTER — Other Ambulatory Visit (HOSPITAL_COMMUNITY): Payer: Self-pay

## 2021-12-01 ENCOUNTER — Other Ambulatory Visit (HOSPITAL_COMMUNITY): Payer: Self-pay

## 2021-12-17 ENCOUNTER — Other Ambulatory Visit (HOSPITAL_COMMUNITY): Payer: Self-pay

## 2021-12-17 ENCOUNTER — Other Ambulatory Visit: Payer: Self-pay | Admitting: Nurse Practitioner

## 2021-12-17 DIAGNOSIS — F909 Attention-deficit hyperactivity disorder, unspecified type: Secondary | ICD-10-CM

## 2021-12-17 MED ORDER — AMPHETAMINE-DEXTROAMPHETAMINE 10 MG PO TABS
20.0000 mg | ORAL_TABLET | Freq: Two times a day (BID) | ORAL | 0 refills | Status: DC
  Filled 2021-12-17: qty 90, 30d supply, fill #0

## 2021-12-23 ENCOUNTER — Other Ambulatory Visit (HOSPITAL_COMMUNITY): Payer: Self-pay

## 2021-12-24 ENCOUNTER — Other Ambulatory Visit (HOSPITAL_COMMUNITY): Payer: Self-pay

## 2021-12-30 ENCOUNTER — Other Ambulatory Visit (HOSPITAL_COMMUNITY): Payer: Self-pay

## 2022-01-04 ENCOUNTER — Encounter: Payer: Self-pay | Admitting: Nurse Practitioner

## 2022-01-04 ENCOUNTER — Other Ambulatory Visit: Payer: Self-pay | Admitting: Nurse Practitioner

## 2022-01-04 ENCOUNTER — Other Ambulatory Visit (HOSPITAL_COMMUNITY): Payer: Self-pay

## 2022-01-04 DIAGNOSIS — E6609 Other obesity due to excess calories: Secondary | ICD-10-CM

## 2022-01-04 MED ORDER — WEGOVY 0.5 MG/0.5ML ~~LOC~~ SOAJ
0.5000 mg | SUBCUTANEOUS | 0 refills | Status: DC
  Filled 2022-01-04: qty 2, 28d supply, fill #0

## 2022-01-19 ENCOUNTER — Telehealth: Payer: 59 | Admitting: Physician Assistant

## 2022-01-19 ENCOUNTER — Other Ambulatory Visit (HOSPITAL_COMMUNITY): Payer: Self-pay

## 2022-01-19 DIAGNOSIS — J029 Acute pharyngitis, unspecified: Secondary | ICD-10-CM

## 2022-01-19 MED ORDER — PREDNISONE 10 MG PO TABS
30.0000 mg | ORAL_TABLET | Freq: Every day | ORAL | 0 refills | Status: AC
  Filled 2022-01-19: qty 9, 3d supply, fill #0

## 2022-01-19 NOTE — Progress Notes (Signed)
E-Visit for Sore Throat  We are sorry that you are not feeling well.  Here is how we plan to help!  Your symptoms indicate a likely viral infection (Pharyngitis).   Pharyngitis is inflammation in the back of the throat which can cause a sore throat, scratchiness and sometimes difficulty swallowing.   Pharyngitis is typically caused by a respiratory virus and will just run its course.  Please keep in mind that your symptoms could last up to 10 days.  For throat pain, we recommend over the counter oral pain relief medications such as acetaminophen or aspirin, or anti-inflammatory medications such as ibuprofen or naproxen sodium.  Topical treatments such as oral throat lozenges or sprays may be used as needed.  Avoid close contact with loved ones, especially the very young and elderly.  Remember to wash your hands thoroughly throughout the day as this is the number one way to prevent the spread of infection and wipe down door knobs and counters with disinfectant.  I will give you as few days worth of prednisone to help reduce inflammation and further help with pain while your body is fighting this off.   After careful review of your answers, I would not recommend an antibiotic for your condition.  Antibiotics should not be used to treat conditions that we suspect are caused by viruses like the virus that causes the common cold or flu. However, some people can have Strep with atypical symptoms. You may need formal testing in clinic or office to confirm if your symptoms continue or worsen.  Providers prescribe antibiotics to treat infections caused by bacteria. Antibiotics are very powerful in treating bacterial infections when they are used properly.  To maintain their effectiveness, they should be used only when necessary.  Overuse of antibiotics has resulted in the development of super bugs that are resistant to treatment!    Home Care: Only take medications as instructed by your medical team. Do not  drink alcohol while taking these medications. A steam or ultrasonic humidifier can help congestion.  You can place a towel over your head and breathe in the steam from hot water coming from a faucet. Avoid close contacts especially the very young and the elderly. Cover your mouth when you cough or sneeze. Always remember to wash your hands.  Get Help Right Away If: You develop worsening fever or throat pain. You develop a severe head ache or visual changes. Your symptoms persist after you have completed your treatment plan.  Make sure you Understand these instructions. Will watch your condition. Will get help right away if you are not doing well or get worse.   Thank you for choosing an e-visit.  Your e-visit answers were reviewed by a board certified advanced clinical practitioner to complete your personal care plan. Depending upon the condition, your plan could have included both over the counter or prescription medications.  Please review your pharmacy choice. Make sure the pharmacy is open so you can pick up prescription now. If there is a problem, you may contact your provider through [employer] and have the prescription routed to another pharmacy.  Your safety is important to [place]. If you have drug allergies check your prescription carefully.   For the next 24 hours you can use MyChart to ask questions about today's visit, request a non-urgent call back, or ask for a work or school excuse. You will get an email in the next two days asking about your experience. I hope that your e-visit has  been valuable and will speed your recovery.

## 2022-01-19 NOTE — Progress Notes (Signed)
I have spent 5 minutes in review of e-visit questionnaire, review and updating patient chart, medical decision making and response to patient.   Olanda Boughner Cody Thaer Miyoshi, PA-C    

## 2022-01-22 ENCOUNTER — Encounter: Payer: Self-pay | Admitting: Nurse Practitioner

## 2022-01-22 ENCOUNTER — Other Ambulatory Visit: Payer: Self-pay | Admitting: Nurse Practitioner

## 2022-01-22 DIAGNOSIS — F909 Attention-deficit hyperactivity disorder, unspecified type: Secondary | ICD-10-CM

## 2022-01-22 MED ORDER — AMPHETAMINE-DEXTROAMPHETAMINE 10 MG PO TABS
10.0000 mg | ORAL_TABLET | Freq: Every day | ORAL | 0 refills | Status: DC
  Filled 2022-01-22: qty 30, 30d supply, fill #0

## 2022-01-22 MED ORDER — AMPHETAMINE-DEXTROAMPHETAMINE 20 MG PO TABS
20.0000 mg | ORAL_TABLET | Freq: Every day | ORAL | 0 refills | Status: DC
  Filled 2022-01-22: qty 30, 30d supply, fill #0

## 2022-01-23 ENCOUNTER — Other Ambulatory Visit (HOSPITAL_COMMUNITY): Payer: Self-pay

## 2022-01-25 ENCOUNTER — Other Ambulatory Visit: Payer: Self-pay | Admitting: Nurse Practitioner

## 2022-01-25 ENCOUNTER — Other Ambulatory Visit (HOSPITAL_COMMUNITY): Payer: Self-pay

## 2022-01-25 DIAGNOSIS — Z6827 Body mass index (BMI) 27.0-27.9, adult: Secondary | ICD-10-CM

## 2022-01-25 DIAGNOSIS — E663 Overweight: Secondary | ICD-10-CM

## 2022-01-25 MED ORDER — SEMAGLUTIDE-WEIGHT MANAGEMENT 1.7 MG/0.75ML ~~LOC~~ SOAJ
1.7000 mg | SUBCUTANEOUS | 0 refills | Status: DC
  Filled 2022-01-25: qty 3, 28d supply, fill #0

## 2022-01-25 MED ORDER — WEGOVY 1 MG/0.5ML ~~LOC~~ SOAJ
1.0000 mg | SUBCUTANEOUS | 0 refills | Status: DC
  Filled 2022-01-25: qty 2, 28d supply, fill #0

## 2022-02-04 ENCOUNTER — Telehealth: Payer: 59 | Admitting: Nurse Practitioner

## 2022-02-04 ENCOUNTER — Other Ambulatory Visit (HOSPITAL_COMMUNITY): Payer: Self-pay

## 2022-02-04 DIAGNOSIS — R3989 Other symptoms and signs involving the genitourinary system: Secondary | ICD-10-CM

## 2022-02-04 MED ORDER — NITROFURANTOIN MONOHYD MACRO 100 MG PO CAPS
100.0000 mg | ORAL_CAPSULE | Freq: Two times a day (BID) | ORAL | 0 refills | Status: DC
  Filled 2022-02-04: qty 10, 5d supply, fill #0

## 2022-02-04 NOTE — Progress Notes (Signed)

## 2022-02-05 ENCOUNTER — Other Ambulatory Visit (HOSPITAL_COMMUNITY): Payer: Self-pay

## 2022-02-09 ENCOUNTER — Other Ambulatory Visit: Payer: Self-pay | Admitting: Nurse Practitioner

## 2022-02-09 ENCOUNTER — Ambulatory Visit (INDEPENDENT_AMBULATORY_CARE_PROVIDER_SITE_OTHER): Payer: 59 | Admitting: Nurse Practitioner

## 2022-02-09 ENCOUNTER — Encounter: Payer: Self-pay | Admitting: Nurse Practitioner

## 2022-02-09 ENCOUNTER — Other Ambulatory Visit (HOSPITAL_COMMUNITY): Payer: Self-pay

## 2022-02-09 VITALS — BP 118/68 | HR 96 | Temp 97.6°F | Ht 62.5 in | Wt 149.0 lb

## 2022-02-09 DIAGNOSIS — F909 Attention-deficit hyperactivity disorder, unspecified type: Secondary | ICD-10-CM | POA: Diagnosis not present

## 2022-02-09 DIAGNOSIS — E559 Vitamin D deficiency, unspecified: Secondary | ICD-10-CM | POA: Diagnosis not present

## 2022-02-09 DIAGNOSIS — Z7689 Persons encountering health services in other specified circumstances: Secondary | ICD-10-CM

## 2022-02-09 MED ORDER — VITAMIN D (ERGOCALCIFEROL) 1.25 MG (50000 UNIT) PO CAPS
50000.0000 [IU] | ORAL_CAPSULE | ORAL | 2 refills | Status: DC
  Filled 2022-02-09: qty 4, 28d supply, fill #0
  Filled 2022-03-17: qty 4, 28d supply, fill #1
  Filled 2022-04-21: qty 4, 28d supply, fill #2
  Filled 2022-05-13 – 2022-05-18 (×2): qty 3, 21d supply, fill #3

## 2022-02-09 NOTE — Patient Instructions (Signed)
Continue medications  Follow-up in 45-months   Vitamin D Deficiency Vitamin D deficiency is when your body does not have enough vitamin D. Vitamin D is important because: It helps your body use certain minerals. It helps to keep your bones healthy. It lessens irritation and swelling (inflammation). It helps the body's defense system (immune system) work better. Not getting enough vitamin D can make your bones soft. What are the causes? Not eating enough foods that have vitamin D in them. Not getting enough sun. Having diseases that make it hard for your body to take in vitamin D. Having had part of your stomach or part of your small intestine taken out. What increases the risk? Being an older adult. Not spending much time outdoors. Living in a long-term care center. Having dark skin. Taking certain medicines. Being overweight or very overweight (obese). Having long-term (chronic) kidney or liver disease. What are the signs or symptoms? In mild cases, there may be no symptoms. If the condition is very bad, symptoms may include: Bone pain. Muscle pain. Not being able to walk normally. Bones that break easily. Joint pain. How is this treated? Treatment may include taking supplements as told by your doctor. Your doctor will tell you what dose is best for you. This may include taking: Vitamin D. Calcium. Follow these instructions at home: Eating and drinking Eat foods that have vitamin D in them, such as: Dairy products, cereals, or juices that have vitamin D added to them (are fortified). Check the label. Fish, such as salmon or trout. Eggs. The vitamin D is in the yolk. Mushrooms that were treated with UV light. Beef liver. The items listed above may not be a complete list of foods and beverages you can eat and drink. Contact a dietitian for more information. General instructions Take over-the-counter and prescription medicines only as told by your doctor. Take supplements  only as told by your doctor. Get sunlight in a safe way. Do not use a tanning bed. Stay at a healthy weight. Lose weight if you need to. Keep all follow-up visits. How is this prevented? Eating foods that naturally have vitamin D in them. Eating or drinking foods and drinks that have vitamin D added to them, such as cereals, juices, and milk. Taking vitamin D or a multivitamin that has vitamin D in it. Being in the sun. Your body makes vitamin D when your skin gets sunlight. Contact a doctor if: Your symptoms do not go away. You feel like you may vomit (nauseous). You vomit. You poop less often than normal, or you have trouble pooping (constipation). Summary Vitamin D deficiency is when your body does not have enough vitamin D. Vitamin D helps to keep your bones healthy. This condition is often treated by taking a supplement. Your doctor will tell you what dose is best for you. This information is not intended to replace advice given to you by your health care provider. Make sure you discuss any questions you have with your health care provider. Document Revised: 01/09/2021 Document Reviewed: 01/09/2021 Elsevier Patient Education  [place].

## 2022-02-09 NOTE — Progress Notes (Signed)
Subjective:  Patient ID: Kaitlyn Sawyer, female    DOB: 01/06/1986  Age: 36 y.o. MRN: [id number]  Chief Complaint  Patient presents with   Vitamin D   ADHD Depression    HPI   Pt presents for follow-up of Vit D deficiency, ADHD, depression,  and weight management.    Vitamin D deficiency, follow-up  Lab Results  Component Value Date   VD25OH 78.1 11/05/2021   VD25OH 25.6 (L) 03/24/2021   CALCIUM 9.5 11/05/2021   CALCIUM 10.1 03/24/2021   Wt Readings from Last 3 Encounters:  02/09/22 149 lb (67.6 kg)  11/05/21 150 lb (68 kg)  07/10/21 145 lb (65.8 kg)    She was last seen for vitamin D deficiency 3 months ago.  Management since that visit includes Vitamin D 50,000 U weekly. She reports excellent compliance with treatment. She is not having side effects.   Depression, Follow-up  She  was last seen for this 3 months ago. Current treatment includes zoloft 50 mg.  She reports excellent compliance with treatment. She is not having side effects.           She reports good tolerance of treatment. Current symptoms include: fatigue She feels she is Improved since last visit.     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 Current treatment includes Adderall 20 mg QAM and 10 mg QPM.   Weight management, follow-up: is currently prescribed Wegovy 1.7 mg injection weekly for obesity. Current weight 149 lbs, BMI 26.82. States she  is consuming a heart healthy diet and walking regularly. States side effects of nausea has subsided.   ---------------------------------------------------------------------------------------------------  Current Outpatient Medications on File Prior to Visit  Medication Sig Dispense Refill   amphetamine-dextroamphetamine (ADDERALL) 10 MG tablet Take 1 tablet (10 mg total) by mouth daily in the afternoon. 30 tablet 0   amphetamine-dextroamphetamine (ADDERALL) 20 MG tablet Take 1 tablet (20 mg total) by mouth daily. 30 tablet 0   nitrofurantoin, macrocrystal-monohydrate, (MACROBID) 100 MG capsule Take 1 capsule (100 mg total) by mouth 2 (two) times daily. 10 capsule 0   Semaglutide-Weight Management 1.7 MG/0.75ML SOAJ Inject 1 pen (1.7 mg) into the skin once a week for 28 days. 3 mL 0   [START ON 03/01/2022] Semaglutide-Weight Management 2.4 MG/0.75ML SOAJ Inject 1 pen (2.4 mg) into the skin once a week for 28 days. 3 mL 0   sertraline (ZOLOFT) 50 MG tablet Take 1 tablet (50 mg total) by mouth daily. 90 tablet 3   Vitamin D, Ergocalciferol, (DRISDOL) 1.25 MG (50000 UNIT) CAPS capsule Take 1 capsule by mouth every 7 days. 5 capsule 2   No current facility-administered medications on file prior to visit.   Past Medical History:  Diagnosis Date   Heart murmur  asymptomatic with exception during pregancy notice occ. palpitations   History of abnormal cervical Pap smear    HPV-- S/P CRYOTHERPY   History of gestational hypertension    DUE TO ECLAMPSIA   History of postpartum depression    History of seizure    x1  secondary to preclampsia 2011 (approx)  none since   Past Surgical History:  Procedure Laterality Date   DILATION AND CURETTAGE OF UTERUS     post partem retained placenta   LAPAROSCOPIC TUBAL LIGATION Bilateral 01/23/2015   Procedure: LAPAROSCOPIC TUBAL LIGATION;  Surgeon: Dian Queen, MD;  Location: [facility];  Service: Gynecology;  Laterality: Bilateral;    uterine ablasion  01/06/2017   WISDOM TOOTH EXTRACTION  age 53    Family History  Problem Relation Age of Onset   Alcohol abuse Maternal Grandfather        liver failure   Heart disease Paternal Grandfather        "massive MI"   Hyperlipidemia Paternal Grandfather    Heart attack Paternal Grandfather    Stroke Paternal Grandmother    Hyperlipidemia Paternal Grandmother    Alcohol abuse Mother    Social History   Socioeconomic History   Marital status: Divorced    Spouse name: Not on file   Number of children: 4   Years of education: Not on file   Highest education level: Not on file  Occupational History   Occupation: Art therapist  Tobacco Use   Smoking status: Former    Packs/day: 0.50    Years: 10.00    Total pack years: 5.00    Types: Cigarettes    Quit date: 04/19/2017    Years since quitting: 4.8   Smokeless tobacco: Never  Vaping Use   Vaping Use: Never used  Substance and Sexual Activity   Alcohol use: Yes    Comment: social   Drug use: No   Sexual activity: Yes  Other Topics Concern   Not on file  Social History Narrative   Not on file   Social Determinants of Health   Financial Resource Strain: Low Risk  (02/09/2022)   Overall Financial Resource Strain (CARDIA)    Difficulty of Paying Living Expenses: Not hard at all  Food Insecurity: No Food Insecurity (02/09/2022)   Hunger Vital Sign    Worried About Running Out of Food in the Last Year: Never true    [place] in the Last Year: Never true  Transportation Needs: No Transportation Needs (02/09/2022)   PRAPARE - Hydrologist (Medical): No    Lack of Transportation (Non-Medical): No  Physical Activity: Sufficiently Active (02/09/2022)   Exercise Vital Sign    Days of Exercise per Week: 3 days    Minutes of Exercise per Session: 60 min  Stress: No Stress Concern Present (02/09/2022)   [place]     Feeling of Stress : Not at all  Social Connections: Moderately Isolated (02/09/2022)   Social Connection and Isolation Panel [NHANES]    Frequency of Communication with Friends and Family: More than three times a week    Frequency of Social Gatherings with Friends and Family: More than three times a week    Attends Religious Services: 1 to 4 times per year    Active Member of [employer] or Organizations: No    Attends Archivist Meetings: Never    Marital Status: Divorced  Review of Systems  Constitutional:  Negative for chills, fatigue and fever.  HENT:  Negative for congestion, ear pain, rhinorrhea and sore throat.   Respiratory:  Negative for cough and shortness of breath.   Cardiovascular:  Negative for chest pain.  Gastrointestinal:  Negative for abdominal pain, constipation, diarrhea, nausea and vomiting.  Genitourinary:  Negative for dysuria and urgency.  Musculoskeletal:  Negative for back pain and myalgias.  Neurological:  Negative for dizziness, weakness, light-headedness and headaches.  Psychiatric/Behavioral:  Negative for dysphoric mood. The patient is not nervous/anxious.      Objective:  BP 118/68   Pulse 96   Temp 97.6 F (36.4 C)   Ht 5' 2.5" (1.588 m)   Wt 149 lb (67.6 kg)   SpO2 99%   BMI 26.82 kg/m      02/09/2022    3:46 PM 11/05/2021    3:47 PM 07/10/2021    9:59 AM  BP/Weight  Systolic BP 118 116   Diastolic BP 68 78   Wt. (Lbs) 149 150 145  BMI 26.82 kg/m2 27.44 kg/m2 25.69 kg/m2    Physical Exam Vitals reviewed.  Skin:    General: Skin is warm.     Capillary Refill: Capillary refill takes less than 2 seconds.  Neurological:     General: No focal deficit present.     Mental Status: She is alert and oriented to person, place, and time.  Psychiatric:        Mood and Affect: Mood normal.        Behavior: Behavior normal.    Lab Results  Component Value Date   WBC 7.2 11/05/2021   HGB 13.0 11/05/2021   HCT 37.7 11/05/2021    PLT 296 11/05/2021   GLUCOSE 97 11/05/2021   CHOL 154 12/10/2020   TRIG 103 12/10/2020   HDL 30 (L) 12/10/2020   LDLCALC 105 (H) 12/10/2020   ALT 14 11/05/2021   AST 14 11/05/2021   NA 139 11/05/2021   K 4.1 11/05/2021   CL 101 11/05/2021   CREATININE 0.67 11/05/2021   BUN 12 11/05/2021   CO2 28 11/05/2021   TSH 1.100 11/05/2021      Assessment & Plan:   1. Vitamin D deficiency  2. Adult ADHD  3. Encounter for weight management   Continue medications  Follow-up in 75-months  Follow-up: 65-months  An After Visit Summary was printed and given to the patient.  I, Janie Morning, NP, have reviewed all documentation for this visit. The documentation on 02/09/22 for the exam, diagnosis, procedures, and orders are all accurate and complete.   Signed, Janie Morning, NP [facility] [phone]

## 2022-02-10 ENCOUNTER — Other Ambulatory Visit (HOSPITAL_COMMUNITY): Payer: Self-pay

## 2022-02-24 ENCOUNTER — Encounter: Payer: Self-pay | Admitting: Nurse Practitioner

## 2022-02-25 ENCOUNTER — Other Ambulatory Visit: Payer: Self-pay | Admitting: Nurse Practitioner

## 2022-02-25 ENCOUNTER — Other Ambulatory Visit (HOSPITAL_COMMUNITY): Payer: Self-pay

## 2022-02-25 DIAGNOSIS — F909 Attention-deficit hyperactivity disorder, unspecified type: Secondary | ICD-10-CM

## 2022-02-25 DIAGNOSIS — E663 Overweight: Secondary | ICD-10-CM

## 2022-02-25 DIAGNOSIS — Z6827 Body mass index (BMI) 27.0-27.9, adult: Secondary | ICD-10-CM

## 2022-02-26 ENCOUNTER — Other Ambulatory Visit (HOSPITAL_COMMUNITY): Payer: Self-pay

## 2022-02-26 MED ORDER — AMPHETAMINE-DEXTROAMPHETAMINE 10 MG PO TABS
10.0000 mg | ORAL_TABLET | Freq: Every day | ORAL | 0 refills | Status: DC
  Filled 2022-02-26: qty 30, 30d supply, fill #0

## 2022-02-26 MED ORDER — WEGOVY 1.7 MG/0.75ML ~~LOC~~ SOAJ
1.7000 mg | SUBCUTANEOUS | 0 refills | Status: DC
  Filled 2022-02-26: qty 3, 28d supply, fill #0

## 2022-02-26 MED ORDER — AMPHETAMINE-DEXTROAMPHETAMINE 20 MG PO TABS
20.0000 mg | ORAL_TABLET | Freq: Every day | ORAL | 0 refills | Status: DC
  Filled 2022-02-26: qty 30, 30d supply, fill #0

## 2022-03-17 ENCOUNTER — Encounter: Payer: Self-pay | Admitting: Nurse Practitioner

## 2022-03-17 ENCOUNTER — Other Ambulatory Visit (HOSPITAL_COMMUNITY): Payer: Self-pay

## 2022-03-25 ENCOUNTER — Other Ambulatory Visit: Payer: Self-pay | Admitting: Nurse Practitioner

## 2022-03-25 ENCOUNTER — Other Ambulatory Visit (HOSPITAL_COMMUNITY): Payer: Self-pay

## 2022-03-25 DIAGNOSIS — F909 Attention-deficit hyperactivity disorder, unspecified type: Secondary | ICD-10-CM

## 2022-03-25 DIAGNOSIS — Z6827 Body mass index (BMI) 27.0-27.9, adult: Secondary | ICD-10-CM

## 2022-03-25 DIAGNOSIS — E663 Overweight: Secondary | ICD-10-CM

## 2022-03-25 MED ORDER — WEGOVY 1.7 MG/0.75ML ~~LOC~~ SOAJ
1.7000 mg | SUBCUTANEOUS | 0 refills | Status: DC
  Filled 2022-03-25: qty 3, 28d supply, fill #0

## 2022-03-25 MED ORDER — AMPHETAMINE-DEXTROAMPHETAMINE 10 MG PO TABS
10.0000 mg | ORAL_TABLET | Freq: Every day | ORAL | 0 refills | Status: DC
  Filled 2022-03-25 – 2022-03-26 (×2): qty 30, 30d supply, fill #0

## 2022-03-25 MED ORDER — AMPHETAMINE-DEXTROAMPHETAMINE 20 MG PO TABS
20.0000 mg | ORAL_TABLET | Freq: Every day | ORAL | 0 refills | Status: DC
  Filled 2022-03-25 – 2022-03-26 (×2): qty 30, 30d supply, fill #0

## 2022-03-26 ENCOUNTER — Other Ambulatory Visit (HOSPITAL_COMMUNITY): Payer: Self-pay

## 2022-04-21 ENCOUNTER — Other Ambulatory Visit (HOSPITAL_COMMUNITY): Payer: Self-pay

## 2022-04-21 ENCOUNTER — Other Ambulatory Visit: Payer: Self-pay | Admitting: Nurse Practitioner

## 2022-04-21 ENCOUNTER — Other Ambulatory Visit: Payer: Self-pay

## 2022-04-21 DIAGNOSIS — E663 Overweight: Secondary | ICD-10-CM

## 2022-04-21 DIAGNOSIS — Z6827 Body mass index (BMI) 27.0-27.9, adult: Secondary | ICD-10-CM

## 2022-04-21 MED ORDER — WEGOVY 1.7 MG/0.75ML ~~LOC~~ SOAJ
1.7000 mg | SUBCUTANEOUS | 0 refills | Status: AC
  Filled 2022-04-21: qty 3, 28d supply, fill #0

## 2022-04-22 ENCOUNTER — Other Ambulatory Visit (HOSPITAL_COMMUNITY): Payer: Self-pay

## 2022-04-23 ENCOUNTER — Other Ambulatory Visit (HOSPITAL_COMMUNITY): Payer: Self-pay

## 2022-04-28 ENCOUNTER — Other Ambulatory Visit: Payer: Self-pay | Admitting: Nurse Practitioner

## 2022-04-28 ENCOUNTER — Other Ambulatory Visit (HOSPITAL_COMMUNITY): Payer: Self-pay

## 2022-04-28 DIAGNOSIS — F909 Attention-deficit hyperactivity disorder, unspecified type: Secondary | ICD-10-CM

## 2022-04-28 MED ORDER — AMPHETAMINE-DEXTROAMPHETAMINE 10 MG PO TABS
10.0000 mg | ORAL_TABLET | Freq: Every day | ORAL | 0 refills | Status: DC
  Filled 2022-04-28: qty 30, 30d supply, fill #0

## 2022-04-28 MED ORDER — AMPHETAMINE-DEXTROAMPHETAMINE 20 MG PO TABS
20.0000 mg | ORAL_TABLET | Freq: Every day | ORAL | 0 refills | Status: DC
  Filled 2022-04-28: qty 30, 30d supply, fill #0

## 2022-04-29 ENCOUNTER — Other Ambulatory Visit (HOSPITAL_COMMUNITY): Payer: Self-pay

## 2022-05-05 ENCOUNTER — Other Ambulatory Visit (HOSPITAL_COMMUNITY): Payer: Self-pay

## 2022-05-05 ENCOUNTER — Telehealth: Payer: Commercial Managed Care - PPO | Admitting: Nurse Practitioner

## 2022-05-05 ENCOUNTER — Ambulatory Visit (INDEPENDENT_AMBULATORY_CARE_PROVIDER_SITE_OTHER): Payer: Commercial Managed Care - PPO | Admitting: Nurse Practitioner

## 2022-05-05 ENCOUNTER — Encounter: Payer: Self-pay | Admitting: Nurse Practitioner

## 2022-05-05 VITALS — BP 124/84 | HR 102 | Temp 97.6°F | Ht 61.0 in | Wt 145.0 lb

## 2022-05-05 DIAGNOSIS — N3001 Acute cystitis with hematuria: Secondary | ICD-10-CM

## 2022-05-05 DIAGNOSIS — M545 Low back pain, unspecified: Secondary | ICD-10-CM

## 2022-05-05 DIAGNOSIS — B3731 Acute candidiasis of vulva and vagina: Secondary | ICD-10-CM | POA: Diagnosis not present

## 2022-05-05 DIAGNOSIS — R399 Unspecified symptoms and signs involving the genitourinary system: Secondary | ICD-10-CM

## 2022-05-05 LAB — POCT URINALYSIS DIP (CLINITEK)
Glucose, UA: NEGATIVE mg/dL
Nitrite, UA: NEGATIVE
POC PROTEIN,UA: 100 — AB
Spec Grav, UA: >=1.03 — AB (ref 1.010–1.025)
Urobilinogen, UA: 0.2 E.U./dL
pH, UA: 5.5 (ref 5.0–8.0)

## 2022-05-05 MED ORDER — NITROFURANTOIN MONOHYD MACRO 100 MG PO CAPS
100.0000 mg | ORAL_CAPSULE | Freq: Two times a day (BID) | ORAL | 0 refills | Status: DC
  Filled 2022-05-05: qty 14, 7d supply, fill #0

## 2022-05-05 MED ORDER — FLUCONAZOLE 150 MG PO TABS
150.0000 mg | ORAL_TABLET | Freq: Once | ORAL | 0 refills | Status: AC
  Filled 2022-05-05: qty 1, 1d supply, fill #0

## 2022-05-05 NOTE — Progress Notes (Signed)
Acute Office Visit  Subjective:    Patient ID: Kaitlyn Sawyer, female    DOB: July 26, 1985, 37 y.o.   MRN: [id number]  Chief Complaint  Patient presents with   Urinary Tract Infection    HPI: Patient is in today for Urinary symptoms  She reports new onset dysuria, urinary frequency, and urinary urgency. Denies fever, chills, or back pain. The current episode started last night and is staying constant. Patient states symptoms are mild in intensity, occurring constantly. She  has not been recently treated for similar symptoms. Did take a left over macrobid last night.      Past Medical History:  Diagnosis Date   Heart murmur    asymptomatic with exception during pregancy notice occ. palpitations   History of abnormal cervical Pap smear    HPV-- S/P CRYOTHERPY   History of gestational hypertension    DUE TO ECLAMPSIA   History of postpartum depression    History of seizure    x1  secondary to preclampsia 2011 (approx)  none since    Past Surgical History:  Procedure Laterality Date   DILATION AND CURETTAGE OF UTERUS     post partem retained placenta   LAPAROSCOPIC TUBAL LIGATION Bilateral 01/23/2015   Procedure: LAPAROSCOPIC TUBAL LIGATION;  Surgeon: Dian Queen, MD;  Location: [facility];  Service: Gynecology;  Laterality: Bilateral;   uterine ablasion  01/06/2017   WISDOM TOOTH EXTRACTION  age 33    Family History  Problem Relation Age of Onset   Alcohol abuse Maternal Grandfather        liver failure   Heart disease Paternal Grandfather        "massive MI"   Hyperlipidemia Paternal Grandfather    Heart attack Paternal Grandfather    Stroke Paternal Grandmother    Hyperlipidemia Paternal Grandmother    Alcohol abuse Mother     Social History   Socioeconomic History   Marital status: Divorced    Spouse name: Not on file   Number of children: 4   Years of education: Not on file   Highest education level: Not on file  Occupational  History   Occupation: Art therapist  Tobacco Use   Smoking status: Former    Packs/day: 0.50    Years: 10.00    Total pack years: 5.00    Types: Cigarettes    Quit date: 04/19/2017    Years since quitting: 5.0   Smokeless tobacco: Never  Vaping Use   Vaping Use: Never used  Substance and Sexual Activity   Alcohol use: Yes    Comment: social   Drug use: No   Sexual activity: Yes  Other Topics Concern   Not on file  Social History Narrative   Not on file   Social Determinants of Health   Financial Resource Strain: Low Risk  (02/09/2022)   Overall Financial Resource Strain (CARDIA)    Difficulty of Paying Living Expenses: Not hard at all  Food Insecurity: No Food Insecurity (02/09/2022)   Hunger Vital Sign    Worried About Running Out of Food in the Last Year: Never true    [place] in the Last Year: Never true  Transportation Needs: No Transportation Needs (02/09/2022)   PRAPARE - Hydrologist (Medical): No    Lack of Transportation (Non-Medical): No  Physical Activity: Sufficiently Active (02/09/2022)   Exercise Vital Sign    Days of Exercise per Week: 3 days  Minutes of Exercise per Session: 60 min  Stress: No Stress Concern Present (02/09/2022)   [place]    Feeling of Stress : Not at all  Social Connections: Moderately Isolated (02/09/2022)   Social Connection and Isolation Panel [NHANES]    Frequency of Communication with Friends and Family: More than three times a week    Frequency of Social Gatherings with Friends and Family: More than three times a week    Attends Religious Services: 1 to 4 times per year    Active Member of [employer] or Organizations: No    Attends Archivist Meetings: Never    Marital Status: Divorced  Human resources officer Violence: Not At Risk (02/09/2022)   Humiliation, Afraid, Rape, and Kick questionnaire    Fear of Current or  Ex-Partner: No    Emotionally Abused: No    Physically Abused: No    Sexually Abused: No    Outpatient Medications Prior to Visit  Medication Sig Dispense Refill   amphetamine-dextroamphetamine (ADDERALL) 10 MG tablet Take 1 tablet (10 mg total) by mouth daily in the afternoon. 30 tablet 0   amphetamine-dextroamphetamine (ADDERALL) 20 MG tablet Take 1 tablet (20 mg total) by mouth daily. 30 tablet 0   nitrofurantoin, macrocrystal-monohydrate, (MACROBID) 100 MG capsule Take 1 capsule (100 mg total) by mouth 2 (two) times daily. 10 capsule 0   Semaglutide-Weight Management (WEGOVY) 1.7 MG/0.75ML SOAJ Inject 1 pen (1.7 mg) into the skin once a week for 28 days. 3 mL 0   Semaglutide-Weight Management 2.4 MG/0.75ML SOAJ Inject 1 pen (2.4 mg) into the skin once a week for 28 days. 3 mL 0   sertraline (ZOLOFT) 50 MG tablet Take 1 tablet (50 mg total) by mouth daily. 90 tablet 3   Vitamin D, Ergocalciferol, (DRISDOL) 1.25 MG (50000 UNIT) CAPS capsule Take 1 capsule by mouth every 7 (seven) days. 5 capsule 2   No facility-administered medications prior to visit.    Allergies  Allergen Reactions   Trintellix [Vortioxetine] Nausea Only   Diclegis [Doxylamine-Pyridoxine] Hives, Itching and Rash   Penicillins Swelling and Rash    Has patient had a PCN reaction causing immediate rash, facial/tongue/throat swelling, SOB or lightheadedness with hypotension: no Has patient had a PCN reaction causing severe rash involving mucus membranes or skin necrosis: unknown Has patient had a PCN reaction that required hospitalization:no Has patient had a PCN reaction occurring within the last 10 years:no If all of the above answers are "NO", then may proceed with Cephalosporin use.     Review of Systems See pertinent positives and negatives per HPI.     Objective:    Physical Exam Vitals reviewed.  Constitutional:      Appearance: Normal appearance.  Abdominal:     Tenderness: There is no abdominal  tenderness. There is no right CVA tenderness or left CVA tenderness.  Skin:    General: Skin is warm.     Capillary Refill: Capillary refill takes less than 2 seconds.  Neurological:     Mental Status: She is alert and oriented to person, place, and time.  Psychiatric:        Behavior: Behavior normal.     BP 124/84   Pulse (!) 102   Temp 97.6 F (36.4 C)   Ht 5\' 1"  (1.549 m)   Wt 145 lb (65.8 kg)   SpO2 97%   BMI 27.40 kg/m   Wt Readings from Last 3 Encounters:  05/05/22  145 lb (65.8 kg)  02/09/22 149 lb (67.6 kg)  11/05/21 150 lb (68 kg)    Health Maintenance Due  Topic Date Due   DTaP/Tdap/Td (1 - Tdap) Never done   PAP SMEAR-Modifier  Never done       Lab Results  Component Value Date   TSH 1.100 11/05/2021   Lab Results  Component Value Date   WBC 7.2 11/05/2021   HGB 13.0 11/05/2021   HCT 37.7 11/05/2021   MCV 85 11/05/2021   PLT 296 11/05/2021   Lab Results  Component Value Date   NA 139 11/05/2021   K 4.1 11/05/2021   CO2 28 11/05/2021   GLUCOSE 97 11/05/2021   BUN 12 11/05/2021   CREATININE 0.67 11/05/2021   BILITOT 0.3 11/05/2021   ALKPHOS 48 11/05/2021   AST 14 11/05/2021   ALT 14 11/05/2021   PROT 6.8 11/05/2021   ALBUMIN 4.4 11/05/2021   CALCIUM 9.5 11/05/2021   ANIONGAP 7 03/19/2016   EGFR 116 11/05/2021   Lab Results  Component Value Date   CHOL 154 12/10/2020   Lab Results  Component Value Date   HDL 30 (L) 12/10/2020   Lab Results  Component Value Date   LDLCALC 105 (H) 12/10/2020   Lab Results  Component Value Date   TRIG 103 12/10/2020   Lab Results  Component Value Date   CHOLHDL 5.1 (H) 12/10/2020        Assessment & Plan:    1. Acute cystitis with hematuria - POCT URINALYSIS DIP (CLINITEK) - Urine Culture - nitrofurantoin, macrocrystal-monohydrate, (MACROBID) 100 MG capsule; Take 1 capsule (100 mg total) by mouth 2 (two) times daily.  Dispense: 14 capsule; Refill: 0  2. Vaginal candidiasis -  fluconazole (DIFLUCAN) 150 MG tablet; Take 1 tablet (150 mg total) by mouth once for 1 dose.  Dispense: 1 tablet; Refill: 0     Rest and push fluids Take Macrobid twice daily with food for 7 days Follow-up as needed   Follow-up: PRN  An After Visit Summary was printed and given to the patient.  I, Janie Morning, NP, have reviewed all documentation for this visit. The documentation on 05/05/22 for the exam, diagnosis, procedures, and orders are all accurate and complete.    Signed, Janie Morning, NP [facility] [phone]

## 2022-05-05 NOTE — Patient Instructions (Signed)
Rest and push fluids Take Macrobid twice daily with food for 7 days Follow-up as needed  Urinary Tract Infection, Adult  A urinary tract infection (UTI) is an infection of any part of the urinary tract. The urinary tract includes the kidneys, ureters, bladder, and urethra. These organs make, store, and get rid of urine in the body. An upper UTI affects the ureters and kidneys. A lower UTI affects the bladder and urethra. What are the causes? Most urinary tract infections are caused by bacteria in your genital area around your urethra, where urine leaves your body. These bacteria grow and cause inflammation of your urinary tract. What increases the risk? You are more likely to develop this condition if: You have a urinary catheter that stays in place. You are not able to control when you urinate or have a bowel movement (incontinence). You are female and you: Use a spermicide or diaphragm for birth control. Have low estrogen levels. Are pregnant. You have certain genes that increase your risk. You are sexually active. You take antibiotic medicines. You have a condition that causes your flow of urine to slow down, such as: An enlarged prostate, if you are female. Blockage in your urethra. A kidney stone. A nerve condition that affects your bladder control (neurogenic bladder). Not getting enough to drink, or not urinating often. You have certain medical conditions, such as: Diabetes. A weak disease-fighting system (immunesystem). Sickle cell disease. Gout. Spinal cord injury. What are the signs or symptoms? Symptoms of this condition include: Needing to urinate right away (urgency). Frequent urination. This may include small amounts of urine each time you urinate. Pain or burning with urination. Blood in the urine. Urine that smells bad or unusual. Trouble urinating. Cloudy urine. Vaginal discharge, if you are female. Pain in the abdomen or the lower back. You may also  have: Vomiting or a decreased appetite. Confusion. Irritability or tiredness. A fever or chills. Diarrhea. The first symptom in older adults may be confusion. In some cases, they may not have any symptoms until the infection has worsened. How is this diagnosed? This condition is diagnosed based on your medical history and a physical exam. You may also have other tests, including: Urine tests. Blood tests. Tests for STIs (sexually transmitted infections). If you have had more than one UTI, a cystoscopy or imaging studies may be done to determine the cause of the infections. How is this treated? Treatment for this condition includes: Antibiotic medicine. Over-the-counter medicines to treat discomfort. Drinking enough water to stay hydrated. If you have frequent infections or have other conditions such as a kidney stone, you may need to see a health care provider who specializes in the urinary tract (urologist). In rare cases, urinary tract infections can cause sepsis. Sepsis is a life-threatening condition that occurs when the body responds to an infection. Sepsis is treated in the hospital with IV antibiotics, fluids, and other medicines. Follow these instructions at home:  Medicines Take over-the-counter and prescription medicines only as told by your health care provider. If you were prescribed an antibiotic medicine, take it as told by your health care provider. Do not stop using the antibiotic even if you start to feel better. General instructions Make sure you: Empty your bladder often and completely. Do not hold urine for long periods of time. Empty your bladder after sex. Wipe from front to back after urinating or having a bowel movement if you are female. Use each tissue only one time when you wipe. Drink enough  fluid to keep your urine pale yellow. Keep all follow-up visits. This is important. Contact a health care provider if: Your symptoms do not get better after 1-2  days. Your symptoms go away and then return. Get help right away if: You have severe pain in your back or your lower abdomen. You have a fever or chills. You have nausea or vomiting. Summary A urinary tract infection (UTI) is an infection of any part of the urinary tract, which includes the kidneys, ureters, bladder, and urethra. Most urinary tract infections are caused by bacteria in your genital area. Treatment for this condition often includes antibiotic medicines. If you were prescribed an antibiotic medicine, take it as told by your health care provider. Do not stop using the antibiotic even if you start to feel better. Keep all follow-up visits. This is important. This information is not intended to replace advice given to you by your health care provider. Make sure you discuss any questions you have with your health care provider. Document Revised: 11/16/2019 Document Reviewed: 11/16/2019 Elsevier Patient Education  [place].

## 2022-05-05 NOTE — Progress Notes (Signed)
Based on what you shared with me it looks like you have uti symptoms with back pain,that should be evaluated in a face to face office visit. Due to the associating back pain you will need a urinalysis and urine culture for proper treatment. NOTE: There will be NO CHARGE for this eVisit   If you are having a true medical emergency please call 911.      For an urgent face to face visit, [facility] has six urgent care centers for your convenience:     [facility] [place] Get Driving Directions [phone] [address] [place], [place] [address]    [facility] ([place]) Get Driving Directions [phone] [address] [place], [place] [address]  [facility] [facility] [place] Get Driving Directions [phone] [address] [place],  [place]  [address]  [facility] Get Driving Directions [phone] [address] [place], [place] [address]   [facility] [place] Get Driving Directions  [phone] [address] [place], [place] [address]   [facility] Get Driving Directions [phone] [address] [place], [place] [address]  Your MyChart E-visit questionnaire answers were reviewed by a board certified advanced clinical practitioner to complete your personal care plan based on your specific symptoms.  Thank you for using e-Visits.         

## 2022-05-07 LAB — URINE CULTURE

## 2022-05-13 ENCOUNTER — Other Ambulatory Visit (HOSPITAL_COMMUNITY): Payer: Self-pay

## 2022-05-13 ENCOUNTER — Other Ambulatory Visit: Payer: Self-pay

## 2022-05-13 ENCOUNTER — Encounter: Payer: Self-pay | Admitting: Nurse Practitioner

## 2022-05-13 ENCOUNTER — Ambulatory Visit (INDEPENDENT_AMBULATORY_CARE_PROVIDER_SITE_OTHER): Payer: Commercial Managed Care - PPO | Admitting: Nurse Practitioner

## 2022-05-13 VITALS — BP 128/74 | HR 79 | Temp 97.5°F | Ht 62.5 in | Wt 146.0 lb

## 2022-05-13 DIAGNOSIS — E559 Vitamin D deficiency, unspecified: Secondary | ICD-10-CM | POA: Diagnosis not present

## 2022-05-13 DIAGNOSIS — F339 Major depressive disorder, recurrent, unspecified: Secondary | ICD-10-CM | POA: Diagnosis not present

## 2022-05-13 DIAGNOSIS — F909 Attention-deficit hyperactivity disorder, unspecified type: Secondary | ICD-10-CM | POA: Diagnosis not present

## 2022-05-13 DIAGNOSIS — E663 Overweight: Secondary | ICD-10-CM | POA: Diagnosis not present

## 2022-05-13 NOTE — Patient Instructions (Signed)
Continue medications Follow-up in 90-months   Living With Attention Deficit Hyperactivity Disorder If you have been diagnosed with attention deficit hyperactivity disorder (ADHD), you may be relieved that you now know why you have felt or behaved a certain way. Still, you may feel overwhelmed about the treatment ahead. You may also wonder how to get the support you need and how to deal with the condition day-to-day. With treatment and support, you can live with ADHD and manage your symptoms. How to manage lifestyle changes Managing lifestyle changes can be challenging. Seeking support from your healthcare provider, therapist, family, and friends can be helpful. How to recognize changes in your condition The following signs may mean that your treatment is working well and your condition is improving: Consistently being on time for appointments. Being more organized at home and work. Other people noticing improvements in your behavior. Achieving goals that you set for yourself. Thinking more clearly. The following signs may mean that your treatment is not working very well: Feeling impatience or more confusion. Missing, forgetting, or being late for appointments. An increasing sense of disorganization and messiness. More difficulty in reaching goals that you set for yourself. Loved ones becoming angry or frustrated with you. Follow these instructions at home: Medicines Take over-the-counter and prescription medicines only as told by your health care provider. Check with your health care provider before taking any new medicines. General instructions Create structure and an organized atmosphere at home. For example: Make a list of tasks, then rank them from most important to least important. Work on one task at a time until your listed tasks are done. Make a daily schedule and follow it consistently every day. Use an appointment calendar, and check it 2-3 times a day to keep on track. Keep it  with you when you leave the house. Create spaces where you keep certain things, and always put things back in their places after you use them. Keep all follow-up visits. Your health care provider will need to monitor your condition and adjust your treatment over time. Where to find support Talking to others  Keep emotion out of important discussions and speak in a calm, logical way. Listen closely and patiently to your loved ones. Try to understand their point of view, and try to avoid getting defensive. Take responsibility for the consequences of your actions. Ask that others do not take your behaviors personally. Aim to solve problems as they come up, and express your feelings instead of bottling them up. Talk openly about what you need from your loved ones and how they can support you. Consider going to family therapy sessions or having your family meet with a specialist who deals with ADHD-related behavior problems. Finances Not all insurance plans cover mental health care, so it is important to check with your insurance carrier. If paying for co-pays or counseling services is a problem, search for a local or county mental health care center. Public mental health care services may be offered there at a low cost or no cost when you are not able to see a private health care provider. If you are taking medicine for ADHD, you may be able to get the generic form, which may be less expensive than brand-name medicine. Some makers of prescription medicines also offer help to patients who cannot afford the medicines that they need. Therapy and support groups Talking with a mental health care provider and participating in support groups can help to improve your quality of life, daily functioning, and  overall symptoms. Questions to ask your health care provider: What are the risks and benefits of taking medicines? Would I benefit from therapy? How often should I follow up with a health care  provider? Where to find more information Learn more about ADHD from: Children and Adults with Attention Deficit Hyperactivity Disorder: [employer]add[employer] of Mental Health: https://www.frey.org/ Centers for Disease Control and Prevention: StoreMirror.com.cy Contact a health care provider if: You have side effects from your medicines, such as: Repeated muscle twitches, coughing, or speech outbursts. Sleep problems. Loss of appetite. Dizziness. Unusually fast heartbeat. Stomach pains. Headaches. You have new or worsening behavior problems. You are struggling with anxiety, depression, or substance abuse. Get help right away if: You have a severe reaction to a medicine. These symptoms may be an emergency. Get help right away. Call 911. Do not wait to see if the symptoms will go away. Do not drive yourself to the hospital. Take one of these steps if you feel like you may hurt yourself or others, or have thoughts about taking your own life: Go to your nearest emergency room. Call 911. Call the [facility] at [phone] or [phone]. This is open 24 hours a day. Text the Crisis Text Line at 801-568-8117. Summary With treatment and support, you can live with ADHD and manage your symptoms. Consider taking part in family therapy or self-help groups with family members or friends. When you talk with friends and family about your ADHD, be patient and communicate openly. Keep all follow-up visits. Your health care provider will need to monitor your condition and adjust your treatment over time. This information is not intended to replace advice given to you by your health care provider. Make sure you discuss any questions you have with your health care provider. Document Revised: 07/24/2021 Document Reviewed: 07/24/2021 Elsevier Patient Education  [employer].

## 2022-05-13 NOTE — Progress Notes (Signed)
Subjective:  Patient ID: Kaitlyn Sawyer, female    DOB: [birth year]  Age: 37 y.o. MRN: [id number]  Chief Complaint  Patient presents with   Depression   Vitamin D Def ADHD    HPI   Pt presents for follow-up of Vit D deficiency, ADHD, depression,  and weight management.   Vitamin D deficiency, follow-up  Lab Results  Component Value Date   VD25OH 78.1 11/05/2021   VD25OH 25.6 (L) 03/24/2021   CALCIUM 9.5 11/05/2021   CALCIUM 10.1 03/24/2021   Wt Readings from Last 3 Encounters:  05/13/22 146 lb (66.2 kg)  05/05/22 145 lb (65.8 kg)  02/09/22 149 lb (67.6 kg)    She was last seen for vitamin D deficiency 3 months ago.  Management since that visit includes Vitamin D 50k weekly. She reports excellent compliance with treatment. She is not having side effects.   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 Current treatment includes Adderall 20 mg QAM and 10 mg QPM.   Weight management, follow-up: Florentina Addison is currently prescribed Wegovy 2.4 mg injection weekly for obesity. Current weight 146 lbs, BMI 26.28. States she is consuming a heart healthy diet and walking regularly. States side effects of nausea has subsided.   Depression, Follow-up  She  was last seen for this 3 months ago. Current treatment includes Zoloft 50 mg daily.   She reports excellent compliance with treatment. She is not having side effects.   She reports good tolerance of treatment. Current symptoms include: fatigue She feels she is Improved since last visit.     05/13/2022    3:22 PM 02/09/2022    3:51 PM 11/05/2021    4:02 PM  Depression screen PHQ 2/9  Decreased Interest 0 0 0  Down, Depressed, Hopeless 0 0 1  PHQ - 2 Score 0 0 1  Altered sleeping 0 1 1  Tired, decreased energy 1 3 3   Change in appetite 0 0 0  Feeling bad or failure about yourself  0 0 0  Trouble concentrating 0 0 1  Moving slowly or fidgety/restless 0 0 0  Suicidal thoughts 0 0 0  PHQ-9  Score 1 4 6   Difficult doing work/chores Not difficult at all Not difficult at all Not difficult at all     ---------------------------------------------------------------------------------------------------      05/13/2022    3:22 PM 02/09/2022    3:51 PM 11/05/2021    4:02 PM 06/09/2021    8:53 AM 03/24/2021    3:27 PM  Depression screen PHQ 2/9  Decreased Interest 0 0 0 0 0  Down, Depressed, Hopeless 0 0 1 0 0  PHQ - 2 Score 0 0 1 0 0  Altered sleeping 0 1 1 1  0  Tired, decreased energy 1 3 3 1 2   Change in appetite 0 0 0 0 0  Feeling bad or failure about yourself  0 0 0 0 0  Trouble concentrating 0 0 1 0 0  Moving slowly or fidgety/restless 0 0 0 1 0  Suicidal thoughts 0 0 0 0 0  PHQ-9 Score 1 4 6 3 2   Difficult doing work/chores Not difficult at all Not difficult at all Not difficult at all Not difficult at all Not difficult at all         09/09/2020    2:00 PM 03/24/2021    3:25 PM 06/09/2021    8:57 AM 02/09/2022    3:51 PM 05/05/2022    3:20  PM  [place] in the past year? 0 0 0 0 0  Was there an injury with Fall? 0 0 0 0 0  Fall Risk Category Calculator 0 0 0 0 0  Fall Risk Category (Retired) Low Low Low Low   (RETIRED) Patient Fall Risk Level Low fall risk Low fall risk Low fall risk Low fall risk   Patient at Risk for Falls Due to  No Fall Risks No Fall Risks No Fall Risks No Fall Risks  Fall risk Follow up Falls evaluation completed Falls evaluation completed Falls evaluation completed Falls evaluation completed Falls evaluation completed      Review of Systems  Constitutional:  Negative for chills, fatigue and fever.  HENT:  Negative for congestion, ear pain, rhinorrhea and sore throat.   Respiratory:  Negative for cough and shortness of breath.   Cardiovascular:  Negative for chest pain.  Gastrointestinal:  Negative for abdominal pain, constipation, diarrhea, nausea and vomiting.  Genitourinary:  Negative for dysuria and urgency.  Musculoskeletal:   Negative for back pain and myalgias.  Neurological:  Negative for dizziness, weakness, light-headedness and headaches.  Psychiatric/Behavioral:  Negative for dysphoric mood. The patient is not nervous/anxious.     Current Outpatient Medications on File Prior to Visit  Medication Sig Dispense Refill   amphetamine-dextroamphetamine (ADDERALL) 10 MG tablet Take 1 tablet (10 mg total) by mouth daily in the afternoon. 30 tablet 0   amphetamine-dextroamphetamine (ADDERALL) 20 MG tablet Take 1 tablet (20 mg total) by mouth daily. 30 tablet 0   Semaglutide-Weight Management (WEGOVY) 1.7 MG/0.75ML SOAJ Inject 1 pen (1.7 mg) into the skin once a week for 28 days. (Patient not taking: Reported on 05/13/2022) 3 mL 0   Semaglutide-Weight Management 2.4 MG/0.75ML SOAJ Inject 1 pen (2.4 mg) into the skin once a week for 28 days. 3 mL 0   sertraline (ZOLOFT) 50 MG tablet Take 1 tablet (50 mg total) by mouth daily. 90 tablet 3   Vitamin D, Ergocalciferol, (DRISDOL) 1.25 MG (50000 UNIT) CAPS capsule Take 1 capsule (50,000 Units total) by mouth every 7 (seven) days. 5 capsule 2   No current facility-administered medications on file prior to visit.   Past Medical History:  Diagnosis Date   Heart murmur    asymptomatic with exception during pregancy notice occ. palpitations   History of abnormal cervical Pap smear    HPV-- S/P CRYOTHERPY   History of gestational hypertension    DUE TO ECLAMPSIA   History of postpartum depression    History of seizure    x1  secondary to preclampsia 2011 (approx)  none since   Past Surgical History:  Procedure Laterality Date   DILATION AND CURETTAGE OF UTERUS     post partem retained placenta   LAPAROSCOPIC TUBAL LIGATION Bilateral 01/23/2015   Procedure: LAPAROSCOPIC TUBAL LIGATION;  Surgeon: Dian Queen, MD;  Location: [facility];  Service: Gynecology;  Laterality: Bilateral;   uterine ablasion  01/06/2017   WISDOM TOOTH EXTRACTION  age 61     Family History  Problem Relation Age of Onset   Alcohol abuse Maternal Grandfather        liver failure   Heart disease Paternal Grandfather        "massive MI"   Hyperlipidemia Paternal Grandfather    Heart attack Paternal Grandfather    Stroke Paternal Grandmother    Hyperlipidemia Paternal Grandmother    Alcohol abuse Mother    Social History   Socioeconomic History  Marital status: Divorced    Spouse name: Not on file   Number of children: 4   Years of education: Not on file   Highest education level: Not on file  Occupational History   Occupation: Art therapist  Tobacco Use   Smoking status: Former    Packs/day: 0.50    Years: 10.00    Total pack years: 5.00    Types: Cigarettes    Quit date: 04/19/2017    Years since quitting: 5.0   Smokeless tobacco: Never  Vaping Use   Vaping Use: Never used  Substance and Sexual Activity   Alcohol use: Yes    Comment: social   Drug use: No   Sexual activity: Yes  Other Topics Concern   Not on file  Social History Narrative   Not on file   Social Determinants of Health   Financial Resource Strain: Low Risk  (02/09/2022)   Overall Financial Resource Strain (CARDIA)    Difficulty of Paying Living Expenses: Not hard at all  Food Insecurity: No Food Insecurity (02/09/2022)   Hunger Vital Sign    Worried About Running Out of Food in the Last Year: Never true    [place] in the Last Year: Never true  Transportation Needs: No Transportation Needs (02/09/2022)   PRAPARE - Hydrologist (Medical): No    Lack of Transportation (Non-Medical): No  Physical Activity: Sufficiently Active (02/09/2022)   Exercise Vital Sign    Days of Exercise per Week: 3 days    Minutes of Exercise per Session: 60 min  Stress: No Stress Concern Present (02/09/2022)   [place]    Feeling of Stress : Not at all  Social Connections: Moderately  Isolated (02/09/2022)   Social Connection and Isolation Panel [NHANES]    Frequency of Communication with Friends and Family: More than three times a week    Frequency of Social Gatherings with Friends and Family: More than three times a week    Attends Religious Services: 1 to 4 times per year    Active Member of [employer] or Organizations: No    Attends Music therapist: Never    Marital Status: Divorced    Objective:  BP 128/74   Pulse 79   Temp (!) 97.5 F (36.4 C)   Ht 5' 2.5" (1.588 m)   Wt 146 lb (66.2 kg)   SpO2 95%   BMI 26.28 kg/m       05/13/2022    3:17 PM 05/05/2022    3:05 PM 02/09/2022    3:46 PM  BP/Weight  Systolic BP  283 151  Diastolic BP  84 68  Wt. (Lbs) 146 145 149  BMI 26.28 kg/m2 27.4 kg/m2 26.82 kg/m2    Physical Exam Vitals reviewed.  Constitutional:      Appearance: Normal appearance.  Cardiovascular:     Rate and Rhythm: Normal rate and regular rhythm.     Pulses: Normal pulses.     Heart sounds: Normal heart sounds.  Pulmonary:     Effort: Pulmonary effort is normal.     Breath sounds: Normal breath sounds.  Skin:    General: Skin is warm.     Capillary Refill: Capillary refill takes less than 2 seconds.  Neurological:     Mental Status: She is alert and oriented to person, place, and time.  Psychiatric:        Behavior: Behavior normal.  Lab Results  Component Value Date   WBC 7.2 11/05/2021   HGB 13.0 11/05/2021   HCT 37.7 11/05/2021   PLT 296 11/05/2021   GLUCOSE 97 11/05/2021   CHOL 154 12/10/2020   TRIG 103 12/10/2020   HDL 30 (L) 12/10/2020   LDLCALC 105 (H) 12/10/2020   ALT 14 11/05/2021   AST 14 11/05/2021   NA 139 11/05/2021   K 4.1 11/05/2021   CL 101 11/05/2021   CREATININE 0.67 11/05/2021   BUN 12 11/05/2021   CO2 28 11/05/2021   TSH 1.100 11/05/2021      Assessment & Plan:     1. Vitamin D deficiency-well controlled -continue Vit D 50 K weekly -continue Vit D rich diet  2.  Adult ADHD-well controlled -continue Adderall 20 mg QAM and 10 mg afternoons  3. Overweight (BMI 25.0-29.9) -continue Wegovy 2.4 mg injection weekly -continue heart healthy diet -continue regular physical activity  4. Depression, recurrent (HCC)-well controlled -continue Zoloft    Follow-up: 75-months  An After Visit Summary was printed and given to the patient.  I, Janie Morning, NP, have reviewed all documentation for this visit. The documentation on 05/14/22 for the exam, diagnosis, procedures, and orders are all accurate and complete.   Janie Morning, NP [facility] [phone]

## 2022-05-18 ENCOUNTER — Other Ambulatory Visit (HOSPITAL_BASED_OUTPATIENT_CLINIC_OR_DEPARTMENT_OTHER): Payer: Self-pay

## 2022-05-18 ENCOUNTER — Other Ambulatory Visit (HOSPITAL_COMMUNITY): Payer: Self-pay

## 2022-05-26 ENCOUNTER — Other Ambulatory Visit (HOSPITAL_COMMUNITY): Payer: Self-pay

## 2022-05-26 ENCOUNTER — Other Ambulatory Visit: Payer: Self-pay

## 2022-05-27 ENCOUNTER — Encounter: Payer: Self-pay | Admitting: Physician Assistant

## 2022-05-28 ENCOUNTER — Other Ambulatory Visit: Payer: Self-pay

## 2022-05-28 ENCOUNTER — Other Ambulatory Visit (HOSPITAL_COMMUNITY): Payer: Self-pay

## 2022-05-28 DIAGNOSIS — Z6827 Body mass index (BMI) 27.0-27.9, adult: Secondary | ICD-10-CM

## 2022-05-28 DIAGNOSIS — F909 Attention-deficit hyperactivity disorder, unspecified type: Secondary | ICD-10-CM

## 2022-05-28 DIAGNOSIS — E663 Overweight: Secondary | ICD-10-CM

## 2022-05-28 MED ORDER — AMPHETAMINE-DEXTROAMPHETAMINE 10 MG PO TABS
10.0000 mg | ORAL_TABLET | Freq: Every day | ORAL | 0 refills | Status: DC
  Filled 2022-05-28: qty 30, 30d supply, fill #0

## 2022-05-28 MED ORDER — AMPHETAMINE-DEXTROAMPHETAMINE 20 MG PO TABS
20.0000 mg | ORAL_TABLET | Freq: Every day | ORAL | 0 refills | Status: DC
  Filled 2022-05-28: qty 30, 30d supply, fill #0

## 2022-05-28 MED ORDER — SEMAGLUTIDE-WEIGHT MANAGEMENT 2.4 MG/0.75ML ~~LOC~~ SOAJ
2.4000 mg | SUBCUTANEOUS | 3 refills | Status: DC
  Filled 2022-05-28: qty 3, 28d supply, fill #0
  Filled 2022-07-09 – 2022-08-03 (×2): qty 3, 28d supply, fill #1
  Filled 2022-08-30: qty 3, 28d supply, fill #2
  Filled 2022-09-29 – 2022-10-07 (×2): qty 3, 28d supply, fill #3

## 2022-05-29 ENCOUNTER — Other Ambulatory Visit (HOSPITAL_COMMUNITY): Payer: Self-pay

## 2022-06-04 ENCOUNTER — Other Ambulatory Visit: Payer: Self-pay

## 2022-06-04 ENCOUNTER — Other Ambulatory Visit (HOSPITAL_COMMUNITY): Payer: Self-pay

## 2022-06-09 ENCOUNTER — Other Ambulatory Visit: Payer: Self-pay

## 2022-06-09 ENCOUNTER — Other Ambulatory Visit (HOSPITAL_COMMUNITY): Payer: Self-pay

## 2022-06-20 ENCOUNTER — Other Ambulatory Visit: Payer: Self-pay

## 2022-06-20 ENCOUNTER — Other Ambulatory Visit (HOSPITAL_COMMUNITY): Payer: Self-pay

## 2022-06-28 ENCOUNTER — Other Ambulatory Visit: Payer: Self-pay | Admitting: Family Medicine

## 2022-06-28 ENCOUNTER — Other Ambulatory Visit (HOSPITAL_COMMUNITY): Payer: Self-pay

## 2022-06-28 DIAGNOSIS — E559 Vitamin D deficiency, unspecified: Secondary | ICD-10-CM

## 2022-06-28 DIAGNOSIS — F909 Attention-deficit hyperactivity disorder, unspecified type: Secondary | ICD-10-CM

## 2022-06-29 ENCOUNTER — Other Ambulatory Visit (HOSPITAL_COMMUNITY): Payer: Self-pay

## 2022-06-29 ENCOUNTER — Other Ambulatory Visit: Payer: Self-pay

## 2022-06-29 MED ORDER — AMPHETAMINE-DEXTROAMPHETAMINE 20 MG PO TABS
20.0000 mg | ORAL_TABLET | Freq: Every day | ORAL | 0 refills | Status: DC
  Filled 2022-06-29 – 2022-07-01 (×2): qty 30, 30d supply, fill #0

## 2022-06-29 MED ORDER — AMPHETAMINE-DEXTROAMPHETAMINE 10 MG PO TABS
10.0000 mg | ORAL_TABLET | Freq: Every day | ORAL | 0 refills | Status: DC
  Filled 2022-06-29 – 2022-07-01 (×2): qty 30, 30d supply, fill #0

## 2022-06-29 MED FILL — Ergocalciferol Cap 1.25 MG (50000 Unit): ORAL | 35 days supply | Qty: 5 | Fill #0 | Status: AC

## 2022-06-30 ENCOUNTER — Encounter (HOSPITAL_COMMUNITY): Payer: Self-pay

## 2022-06-30 ENCOUNTER — Other Ambulatory Visit (HOSPITAL_COMMUNITY): Payer: Self-pay

## 2022-07-01 ENCOUNTER — Other Ambulatory Visit (HOSPITAL_COMMUNITY): Payer: Self-pay

## 2022-07-02 ENCOUNTER — Other Ambulatory Visit (HOSPITAL_BASED_OUTPATIENT_CLINIC_OR_DEPARTMENT_OTHER): Payer: Self-pay

## 2022-07-02 ENCOUNTER — Other Ambulatory Visit: Payer: Self-pay

## 2022-07-09 ENCOUNTER — Other Ambulatory Visit: Payer: Self-pay | Admitting: Family Medicine

## 2022-07-09 ENCOUNTER — Other Ambulatory Visit (HOSPITAL_COMMUNITY): Payer: Self-pay

## 2022-07-09 DIAGNOSIS — F411 Generalized anxiety disorder: Secondary | ICD-10-CM

## 2022-07-13 ENCOUNTER — Other Ambulatory Visit (HOSPITAL_COMMUNITY): Payer: Self-pay

## 2022-07-13 MED ORDER — SERTRALINE HCL 50 MG PO TABS
50.0000 mg | ORAL_TABLET | Freq: Every day | ORAL | 3 refills | Status: DC
  Filled 2022-07-13: qty 90, 90d supply, fill #0
  Filled 2022-10-11: qty 90, 90d supply, fill #1
  Filled 2023-01-10: qty 90, 90d supply, fill #2
  Filled 2023-04-08: qty 90, 90d supply, fill #3

## 2022-07-14 ENCOUNTER — Other Ambulatory Visit (HOSPITAL_COMMUNITY): Payer: Self-pay

## 2022-07-14 ENCOUNTER — Encounter: Payer: Self-pay | Admitting: Physician Assistant

## 2022-07-24 ENCOUNTER — Other Ambulatory Visit (HOSPITAL_COMMUNITY): Payer: Self-pay

## 2022-07-26 ENCOUNTER — Ambulatory Visit (INDEPENDENT_AMBULATORY_CARE_PROVIDER_SITE_OTHER): Payer: No Typology Code available for payment source

## 2022-07-26 ENCOUNTER — Other Ambulatory Visit (HOSPITAL_COMMUNITY): Payer: Self-pay

## 2022-07-26 DIAGNOSIS — Z111 Encounter for screening for respiratory tuberculosis: Secondary | ICD-10-CM

## 2022-07-26 NOTE — Progress Notes (Signed)
Patient came in for TB screening test, given in the left forearm. Patient told to come back Wednesday for reading.

## 2022-07-28 ENCOUNTER — Ambulatory Visit (INDEPENDENT_AMBULATORY_CARE_PROVIDER_SITE_OTHER): Payer: No Typology Code available for payment source

## 2022-07-28 ENCOUNTER — Other Ambulatory Visit: Payer: Self-pay | Admitting: Physician Assistant

## 2022-07-28 ENCOUNTER — Other Ambulatory Visit (HOSPITAL_COMMUNITY): Payer: Self-pay

## 2022-07-28 DIAGNOSIS — F909 Attention-deficit hyperactivity disorder, unspecified type: Secondary | ICD-10-CM

## 2022-07-28 DIAGNOSIS — Z111 Encounter for screening for respiratory tuberculosis: Secondary | ICD-10-CM | POA: Diagnosis not present

## 2022-07-28 LAB — TB SKIN TEST
Induration: 0 mm
TB Skin Test: NEGATIVE

## 2022-07-28 MED ORDER — AMPHETAMINE-DEXTROAMPHETAMINE 10 MG PO TABS
10.0000 mg | ORAL_TABLET | Freq: Every day | ORAL | 0 refills | Status: DC
  Filled 2022-07-28 – 2022-08-06 (×2): qty 30, 30d supply, fill #0

## 2022-07-28 MED ORDER — AMPHETAMINE-DEXTROAMPHETAMINE 20 MG PO TABS
20.0000 mg | ORAL_TABLET | Freq: Every day | ORAL | 0 refills | Status: DC
  Filled 2022-07-28 – 2022-08-06 (×2): qty 30, 30d supply, fill #0

## 2022-07-28 MED FILL — Ergocalciferol Cap 1.25 MG (50000 Unit): ORAL | 35 days supply | Qty: 5 | Fill #1 | Status: CN

## 2022-07-28 NOTE — Progress Notes (Signed)
Patient came in today to get the reading done on her TB skin Test.  Results sent to provider.

## 2022-07-29 ENCOUNTER — Encounter: Payer: Self-pay | Admitting: Nurse Practitioner

## 2022-08-03 ENCOUNTER — Other Ambulatory Visit (HOSPITAL_COMMUNITY): Payer: Self-pay

## 2022-08-05 ENCOUNTER — Other Ambulatory Visit (HOSPITAL_COMMUNITY): Payer: Self-pay

## 2022-08-06 ENCOUNTER — Other Ambulatory Visit (HOSPITAL_COMMUNITY): Payer: Self-pay

## 2022-08-06 MED FILL — Ergocalciferol Cap 1.25 MG (50000 Unit): ORAL | 35 days supply | Qty: 5 | Fill #1 | Status: AC

## 2022-08-16 ENCOUNTER — Other Ambulatory Visit (HOSPITAL_COMMUNITY): Payer: Self-pay

## 2022-08-16 ENCOUNTER — Ambulatory Visit (INDEPENDENT_AMBULATORY_CARE_PROVIDER_SITE_OTHER): Payer: No Typology Code available for payment source | Admitting: Physician Assistant

## 2022-08-16 VITALS — BP 112/74 | HR 84 | Temp 97.7°F | Ht 62.25 in | Wt 145.0 lb

## 2022-08-16 DIAGNOSIS — F339 Major depressive disorder, recurrent, unspecified: Secondary | ICD-10-CM | POA: Diagnosis not present

## 2022-08-16 DIAGNOSIS — F909 Attention-deficit hyperactivity disorder, unspecified type: Secondary | ICD-10-CM | POA: Diagnosis not present

## 2022-08-16 NOTE — Progress Notes (Signed)
Established Patient Office Visit  Subjective:  Patient ID: Kaitlyn Sawyer, female    DOB: [birth year]  Age: 37 y.o. MRN: [id number]  CC:  Chief Complaint  Patient presents with   ADHD   Depression    HPI BRITTNE KAWASAKI presents for follow up ADD - pt with history of ADD diagnosed as an adult.  Currently taking adderall 20mg  qam and adderall 10mg  qpm.  States medication working well for her  Pt with history of mild depression. Currently taking zoloft 50mg  qd - says medication works well for symptoms - denies any problems  Past Medical History:  Diagnosis Date   Heart murmur    asymptomatic with exception during pregancy notice occ. palpitations   History of abnormal cervical Pap smear    HPV-- S/P CRYOTHERPY   History of gestational hypertension    DUE TO ECLAMPSIA   History of postpartum depression    History of seizure    x1  secondary to preclampsia 2011 (approx)  none since    Past Surgical History:  Procedure Laterality Date   DILATION AND CURETTAGE OF UTERUS     post partem retained placenta   LAPAROSCOPIC TUBAL LIGATION Bilateral 01/23/2015   Procedure: LAPAROSCOPIC TUBAL LIGATION;  Surgeon: Marcelle Overlie, MD;  Location: [facility];  Service: Gynecology;  Laterality: Bilateral;   uterine ablasion  01/06/2017   WISDOM TOOTH EXTRACTION  age 50    Family History  Problem Relation Age of Onset   Alcohol abuse Maternal Grandfather        liver failure   Heart disease Paternal Grandfather        "massive MI"   Hyperlipidemia Paternal Grandfather    Heart attack Paternal Grandfather    Stroke Paternal Grandmother    Hyperlipidemia Paternal Grandmother    Alcohol abuse Mother     Social History   Socioeconomic History   Marital status: Divorced    Spouse name: Not on file   Number of children: 4   Years of education: Not on file   Highest education level: Associate degree: occupational, Scientist, product/process development, or vocational program  Occupational  History   Occupation: Sales executive  Tobacco Use   Smoking status: Former    Packs/day: 0.50    Years: 10.00    Additional pack years: 0.00    Total pack years: 5.00    Types: Cigarettes    Quit date: 04/19/2017    Years since quitting: 5.3   Smokeless tobacco: Never  Vaping Use   Vaping Use: Never used  Substance and Sexual Activity   Alcohol use: Yes    Comment: social   Drug use: No   Sexual activity: Yes  Other Topics Concern   Not on file  Social History Narrative   Not on file   Social Determinants of Health   Financial Resource Strain: Low Risk  (08/16/2022)   Overall Financial Resource Strain (CARDIA)    Difficulty of Paying Living Expenses: Not very hard  Food Insecurity: No Food Insecurity (08/16/2022)   Hunger Vital Sign    Worried About Running Out of Food in the Last Year: Never true    Ran Out of Food in the Last Year: Never true  Transportation Needs: No Transportation Needs (08/16/2022)   PRAPARE - Administrator, Civil Service (Medical): No    Lack of Transportation (Non-Medical): No  Physical Activity: Insufficiently Active (08/16/2022)   Exercise Vital Sign    Days of Exercise per Week:  3 days    Minutes of Exercise per Session: 30 min  Stress: No Stress Concern Present (08/16/2022)   [employer] of Occupational Health - Occupational Stress Questionnaire    Feeling of Stress : Only a little  Social Connections: Moderately Isolated (08/16/2022)   Social Connection and Isolation Panel [NHANES]    Frequency of Communication with Friends and Family: Three times a week    Frequency of Social Gatherings with Friends and Family: Once a week    Attends Religious Services: More than 4 times per year    Active Member of [employer] or Organizations: No    Attends Banker Meetings: Never    Marital Status: Divorced  Catering manager Violence: Not At Risk (02/09/2022)   Humiliation, Afraid, Rape, and Kick questionnaire    Fear of Current  or Ex-Partner: No    Emotionally Abused: No    Physically Abused: No    Sexually Abused: No     Current Outpatient Medications:    amphetamine-dextroamphetamine (ADDERALL) 10 MG tablet, Take 1 tablet (10 mg total) by mouth daily in the afternoon., Disp: 30 tablet, Rfl: 0   amphetamine-dextroamphetamine (ADDERALL) 20 MG tablet, Take 1 tablet (20 mg total) by mouth daily., Disp: 30 tablet, Rfl: 0   Semaglutide-Weight Management 2.4 MG/0.75ML SOAJ, Inject 1 pen (2.4 mg) into the skin once a week for 28 days., Disp: 3 mL, Rfl: 3   sertraline (ZOLOFT) 50 MG tablet, Take 1 tablet (50 mg total) by mouth daily., Disp: 90 tablet, Rfl: 3   Vitamin D, Ergocalciferol, (DRISDOL) 1.25 MG (50000 UNIT) CAPS capsule, Take 1 capsule by mouth every 7 days., Disp: 5 capsule, Rfl: 2   Allergies  Allergen Reactions   Trintellix [Vortioxetine] Nausea Only   Diclegis [Doxylamine-Pyridoxine] Hives, Itching and Rash   Penicillins Swelling and Rash    Has patient had a PCN reaction causing immediate rash, facial/tongue/throat swelling, SOB or lightheadedness with hypotension: no Has patient had a PCN reaction causing severe rash involving mucus membranes or skin necrosis: unknown Has patient had a PCN reaction that required hospitalization:no Has patient had a PCN reaction occurring within the last 10 years:no If all of the above answers are "NO", then may proceed with Cephalosporin use.     ROS CONSTITUTIONAL: Negative for chills, fatigue, fever,    CARDIOVASCULAR: Negative for chest pain, dizziness, RESPIRATORY: Negative for recent cough and dyspnea.   PSYCHIATRIC: Negative for sleep disturbance and to question depression screen.  Negative for depression, negative for anhedonia.        Objective:  PHYSICAL EXAM:   VS: BP 112/74 (BP Location: Left Arm, Patient Position: Sitting, Cuff Size: Normal)   Pulse 84   Temp 97.7 F (36.5 C) (Temporal)   Ht 5' 2.25" (1.581 m)   Wt 145 lb (65.8 kg)   SpO2  100%   BMI 26.31 kg/m   GEN: Well nourished, well developed, in no acute distress  Cardiac: RRR; no murmurs,  Respiratory:  normal respiratory rate and pattern with no distress - normal breath sounds with no rales, rhonchi, wheezes or rubs  Psych: euthymic mood, appropriate affect and demeanor     Health Maintenance Due  Topic Date Due   DTaP/Tdap/Td (1 - Tdap) Never done   PAP SMEAR-Modifier  Never done    There are no preventive care reminders to display for this patient.  Lab Results  Component Value Date   TSH 1.100 11/05/2021   Lab Results  Component Value Date  WBC 7.2 11/05/2021   HGB 13.0 11/05/2021   HCT 37.7 11/05/2021   MCV 85 11/05/2021   PLT 296 11/05/2021   Lab Results  Component Value Date   NA 139 11/05/2021   K 4.1 11/05/2021   CO2 28 11/05/2021   GLUCOSE 97 11/05/2021   BUN 12 11/05/2021   CREATININE 0.67 11/05/2021   BILITOT 0.3 11/05/2021   ALKPHOS 48 11/05/2021   AST 14 11/05/2021   ALT 14 11/05/2021   PROT 6.8 11/05/2021   ALBUMIN 4.4 11/05/2021   CALCIUM 9.5 11/05/2021   ANIONGAP 7 03/19/2016   EGFR 116 11/05/2021   Lab Results  Component Value Date   CHOL 154 12/10/2020   Lab Results  Component Value Date   HDL 30 (L) 12/10/2020   Lab Results  Component Value Date   LDLCALC 105 (H) 12/10/2020   Lab Results  Component Value Date   TRIG 103 12/10/2020   Lab Results  Component Value Date   CHOLHDL 5.1 (H) 12/10/2020   No results found for: "HGBA1C"    Assessment & Plan:   Problem List Items Addressed This Visit       Other   Adult ADHD - Primary Continue current medication   Depression, recurrent (HCC) Continue current medication    No orders of the defined types were placed in this encounter.   Follow-up: Return in about 4 months (around 12/16/2022) for fasting physical - no pap.    SARA R Kinlie Janice, PA-C

## 2022-08-30 ENCOUNTER — Other Ambulatory Visit: Payer: Self-pay | Admitting: Physician Assistant

## 2022-08-30 ENCOUNTER — Other Ambulatory Visit: Payer: Self-pay

## 2022-08-30 DIAGNOSIS — F909 Attention-deficit hyperactivity disorder, unspecified type: Secondary | ICD-10-CM

## 2022-08-30 MED ORDER — AMPHETAMINE-DEXTROAMPHETAMINE 10 MG PO TABS
10.0000 mg | ORAL_TABLET | Freq: Every day | ORAL | 0 refills | Status: DC
  Filled 2022-08-30 – 2022-09-03 (×3): qty 30, 30d supply, fill #0

## 2022-08-30 MED ORDER — AMPHETAMINE-DEXTROAMPHETAMINE 20 MG PO TABS
20.0000 mg | ORAL_TABLET | Freq: Every day | ORAL | 0 refills | Status: DC
  Filled 2022-08-30 – 2022-09-03 (×3): qty 30, 30d supply, fill #0

## 2022-08-30 MED FILL — Ergocalciferol Cap 1.25 MG (50000 Unit): ORAL | 35 days supply | Qty: 5 | Fill #2 | Status: CN

## 2022-08-31 ENCOUNTER — Other Ambulatory Visit (HOSPITAL_COMMUNITY): Payer: Self-pay

## 2022-08-31 ENCOUNTER — Other Ambulatory Visit: Payer: Self-pay

## 2022-09-01 ENCOUNTER — Other Ambulatory Visit: Payer: Self-pay

## 2022-09-01 ENCOUNTER — Other Ambulatory Visit (HOSPITAL_COMMUNITY): Payer: Self-pay

## 2022-09-01 ENCOUNTER — Encounter: Payer: Self-pay | Admitting: Physician Assistant

## 2022-09-01 MED FILL — Ergocalciferol Cap 1.25 MG (50000 Unit): ORAL | 35 days supply | Qty: 5 | Fill #2 | Status: AC

## 2022-09-02 ENCOUNTER — Other Ambulatory Visit (HOSPITAL_COMMUNITY): Payer: Self-pay

## 2022-09-02 ENCOUNTER — Other Ambulatory Visit: Payer: Self-pay

## 2022-09-03 ENCOUNTER — Other Ambulatory Visit (HOSPITAL_COMMUNITY): Payer: Self-pay

## 2022-09-03 ENCOUNTER — Other Ambulatory Visit: Payer: Self-pay

## 2022-09-16 ENCOUNTER — Other Ambulatory Visit: Payer: Self-pay

## 2022-09-16 DIAGNOSIS — Z111 Encounter for screening for respiratory tuberculosis: Secondary | ICD-10-CM

## 2022-09-20 ENCOUNTER — Ambulatory Visit (INDEPENDENT_AMBULATORY_CARE_PROVIDER_SITE_OTHER): Payer: No Typology Code available for payment source

## 2022-09-20 DIAGNOSIS — Z111 Encounter for screening for respiratory tuberculosis: Secondary | ICD-10-CM

## 2022-09-22 ENCOUNTER — Encounter: Payer: Self-pay | Admitting: Physician Assistant

## 2022-09-22 LAB — QUANTIFERON-TB GOLD PLUS
QuantiFERON Mitogen Value: >10 IU/mL
QuantiFERON Nil Value: 0 IU/mL
QuantiFERON TB1 Ag Value: 0.07 IU/mL
QuantiFERON TB2 Ag Value: 0.07 IU/mL
QuantiFERON-TB Gold Plus: NEGATIVE

## 2022-10-04 ENCOUNTER — Other Ambulatory Visit (HOSPITAL_COMMUNITY): Payer: Self-pay

## 2022-10-04 ENCOUNTER — Other Ambulatory Visit: Payer: Self-pay | Admitting: Family Medicine

## 2022-10-04 DIAGNOSIS — F909 Attention-deficit hyperactivity disorder, unspecified type: Secondary | ICD-10-CM

## 2022-10-04 MED ORDER — AMPHETAMINE-DEXTROAMPHETAMINE 20 MG PO TABS
20.0000 mg | ORAL_TABLET | Freq: Every day | ORAL | 0 refills | Status: DC
Start: 2022-10-04 — End: 2022-11-01
  Filled 2022-10-04: qty 30, 30d supply, fill #0

## 2022-10-04 MED ORDER — AMPHETAMINE-DEXTROAMPHETAMINE 10 MG PO TABS
10.0000 mg | ORAL_TABLET | Freq: Every day | ORAL | 0 refills | Status: DC
Start: 2022-10-04 — End: 2022-11-01
  Filled 2022-10-04: qty 30, 30d supply, fill #0

## 2022-10-07 ENCOUNTER — Other Ambulatory Visit (HOSPITAL_COMMUNITY): Payer: Self-pay

## 2022-10-11 ENCOUNTER — Other Ambulatory Visit: Payer: Self-pay | Admitting: Family Medicine

## 2022-10-11 ENCOUNTER — Other Ambulatory Visit: Payer: Self-pay

## 2022-10-11 ENCOUNTER — Other Ambulatory Visit (HOSPITAL_COMMUNITY): Payer: Self-pay

## 2022-10-11 DIAGNOSIS — E559 Vitamin D deficiency, unspecified: Secondary | ICD-10-CM

## 2022-10-11 MED ORDER — VITAMIN D (ERGOCALCIFEROL) 1.25 MG (50000 UNIT) PO CAPS
50000.0000 [IU] | ORAL_CAPSULE | ORAL | 2 refills | Status: DC
Start: 2022-10-11 — End: 2023-03-15
  Filled 2022-10-11: qty 5, 35d supply, fill #0
  Filled 2022-11-14: qty 5, 35d supply, fill #1
  Filled 2022-12-18: qty 5, 35d supply, fill #2

## 2022-11-01 ENCOUNTER — Other Ambulatory Visit (HOSPITAL_COMMUNITY): Payer: Self-pay

## 2022-11-01 ENCOUNTER — Other Ambulatory Visit: Payer: Self-pay | Admitting: Family Medicine

## 2022-11-01 ENCOUNTER — Other Ambulatory Visit: Payer: Self-pay | Admitting: Physician Assistant

## 2022-11-01 DIAGNOSIS — E663 Overweight: Secondary | ICD-10-CM

## 2022-11-01 DIAGNOSIS — Z6827 Body mass index (BMI) 27.0-27.9, adult: Secondary | ICD-10-CM

## 2022-11-01 DIAGNOSIS — F909 Attention-deficit hyperactivity disorder, unspecified type: Secondary | ICD-10-CM

## 2022-11-01 MED ORDER — AMPHETAMINE-DEXTROAMPHETAMINE 10 MG PO TABS
10.0000 mg | ORAL_TABLET | Freq: Every day | ORAL | 0 refills | Status: DC
Start: 2022-11-01 — End: 2022-11-04
  Filled 2022-11-01: qty 30, 30d supply, fill #0

## 2022-11-01 MED ORDER — WEGOVY 2.4 MG/0.75ML ~~LOC~~ SOAJ
2.4000 mg | SUBCUTANEOUS | 0 refills | Status: DC
Start: 2022-11-01 — End: 2022-12-03
  Filled 2022-11-01: qty 3, 28d supply, fill #0

## 2022-11-01 MED ORDER — AMPHETAMINE-DEXTROAMPHETAMINE 20 MG PO TABS
20.0000 mg | ORAL_TABLET | Freq: Every day | ORAL | 0 refills | Status: DC
Start: 2022-11-01 — End: 2022-11-04
  Filled 2022-11-01: qty 30, 30d supply, fill #0

## 2022-11-02 ENCOUNTER — Other Ambulatory Visit (HOSPITAL_COMMUNITY): Payer: Self-pay

## 2022-11-04 ENCOUNTER — Other Ambulatory Visit: Payer: Self-pay

## 2022-11-04 ENCOUNTER — Encounter: Payer: Self-pay | Admitting: Physician Assistant

## 2022-11-04 DIAGNOSIS — F909 Attention-deficit hyperactivity disorder, unspecified type: Secondary | ICD-10-CM

## 2022-11-04 MED ORDER — AMPHETAMINE-DEXTROAMPHETAMINE 10 MG PO TABS
10.0000 mg | ORAL_TABLET | Freq: Every day | ORAL | 0 refills | Status: DC
Start: 2022-11-04 — End: 2022-12-03
  Filled 2022-11-04: qty 30, 30d supply, fill #0

## 2022-11-04 MED ORDER — AMPHETAMINE-DEXTROAMPHETAMINE 20 MG PO TABS
20.0000 mg | ORAL_TABLET | Freq: Every day | ORAL | 0 refills | Status: DC
Start: 2022-11-04 — End: 2022-12-03
  Filled 2022-11-04: qty 30, 30d supply, fill #0

## 2022-11-05 ENCOUNTER — Other Ambulatory Visit (HOSPITAL_COMMUNITY): Payer: Self-pay

## 2022-11-06 ENCOUNTER — Other Ambulatory Visit (HOSPITAL_COMMUNITY): Payer: Self-pay

## 2022-11-15 ENCOUNTER — Other Ambulatory Visit (HOSPITAL_COMMUNITY): Payer: Self-pay

## 2022-12-03 ENCOUNTER — Other Ambulatory Visit (HOSPITAL_COMMUNITY): Payer: Self-pay

## 2022-12-03 ENCOUNTER — Other Ambulatory Visit: Payer: Self-pay | Admitting: Physician Assistant

## 2022-12-03 DIAGNOSIS — E663 Overweight: Secondary | ICD-10-CM

## 2022-12-03 DIAGNOSIS — F909 Attention-deficit hyperactivity disorder, unspecified type: Secondary | ICD-10-CM

## 2022-12-03 DIAGNOSIS — Z6827 Body mass index (BMI) 27.0-27.9, adult: Secondary | ICD-10-CM

## 2022-12-03 MED ORDER — AMPHETAMINE-DEXTROAMPHETAMINE 10 MG PO TABS
10.0000 mg | ORAL_TABLET | Freq: Every day | ORAL | 0 refills | Status: DC
Start: 2022-12-03 — End: 2023-01-10
  Filled 2022-12-03 – 2022-12-11 (×2): qty 30, 30d supply, fill #0

## 2022-12-03 MED ORDER — AMPHETAMINE-DEXTROAMPHETAMINE 20 MG PO TABS
20.0000 mg | ORAL_TABLET | Freq: Every day | ORAL | 0 refills | Status: DC
Start: 2022-12-03 — End: 2023-01-10
  Filled 2022-12-03 – 2022-12-11 (×2): qty 30, 30d supply, fill #0

## 2022-12-03 MED ORDER — WEGOVY 2.4 MG/0.75ML ~~LOC~~ SOAJ
2.4000 mg | SUBCUTANEOUS | 0 refills | Status: DC
Start: 2022-12-03 — End: 2023-02-11
  Filled 2022-12-03 – 2022-12-18 (×2): qty 3, 28d supply, fill #0

## 2022-12-11 ENCOUNTER — Other Ambulatory Visit (HOSPITAL_COMMUNITY): Payer: Self-pay

## 2022-12-13 ENCOUNTER — Encounter: Payer: No Typology Code available for payment source | Admitting: Physician Assistant

## 2022-12-17 ENCOUNTER — Encounter: Payer: No Typology Code available for payment source | Admitting: Physician Assistant

## 2022-12-18 ENCOUNTER — Other Ambulatory Visit (HOSPITAL_BASED_OUTPATIENT_CLINIC_OR_DEPARTMENT_OTHER): Payer: Self-pay

## 2022-12-29 ENCOUNTER — Other Ambulatory Visit (HOSPITAL_COMMUNITY): Payer: Self-pay

## 2022-12-30 ENCOUNTER — Other Ambulatory Visit (HOSPITAL_COMMUNITY): Payer: Self-pay

## 2023-01-10 ENCOUNTER — Other Ambulatory Visit: Payer: Self-pay | Admitting: Physician Assistant

## 2023-01-10 ENCOUNTER — Other Ambulatory Visit: Payer: Self-pay

## 2023-01-10 ENCOUNTER — Other Ambulatory Visit (HOSPITAL_COMMUNITY): Payer: Self-pay

## 2023-01-10 DIAGNOSIS — F909 Attention-deficit hyperactivity disorder, unspecified type: Secondary | ICD-10-CM

## 2023-01-10 MED ORDER — AMPHETAMINE-DEXTROAMPHETAMINE 20 MG PO TABS
20.0000 mg | ORAL_TABLET | Freq: Every day | ORAL | 0 refills | Status: DC
Start: 2023-01-10 — End: 2023-02-11
  Filled 2023-01-10: qty 30, 30d supply, fill #0

## 2023-01-10 MED ORDER — AMPHETAMINE-DEXTROAMPHETAMINE 10 MG PO TABS
10.0000 mg | ORAL_TABLET | Freq: Every day | ORAL | 0 refills | Status: DC
Start: 2023-01-10 — End: 2023-02-11
  Filled 2023-01-10: qty 30, 30d supply, fill #0

## 2023-01-12 ENCOUNTER — Other Ambulatory Visit (HOSPITAL_COMMUNITY): Payer: Self-pay

## 2023-01-12 IMAGING — US US ABDOMEN LIMITED
1 series · 14 of 25 positions shown · non-contrast
Comparison: None.

CLINICAL DATA: Nausea and vomiting. Right upper quadrant pain for 2
months.

EXAM:
ULTRASOUND ABDOMEN LIMITED RIGHT UPPER QUADRANT

[Series 1: us abdomen limited ruq (liver/gb) · 14 of 40 slices shown]
[im 1/40]
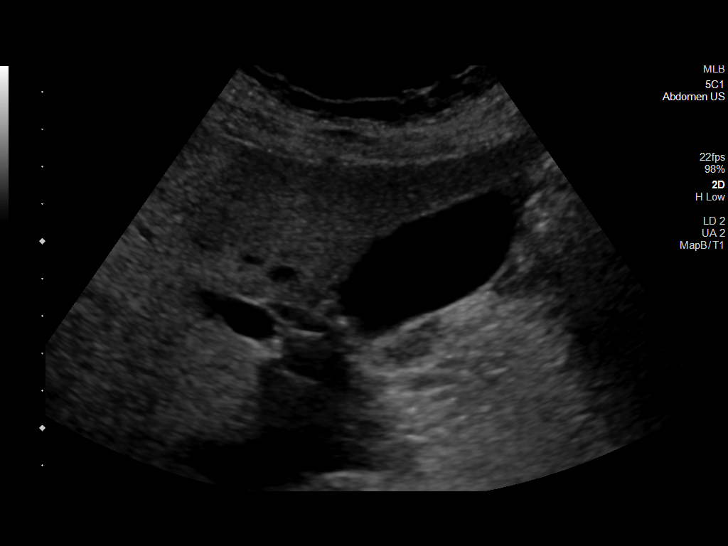
[im 4/40]
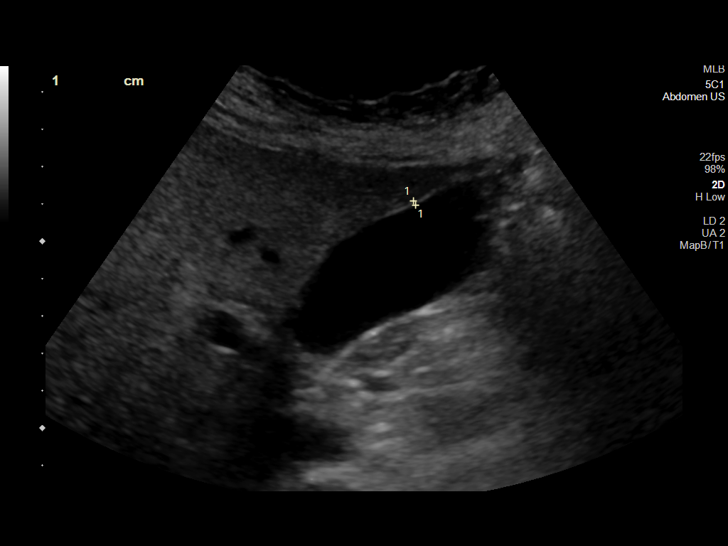
[im 7/40]
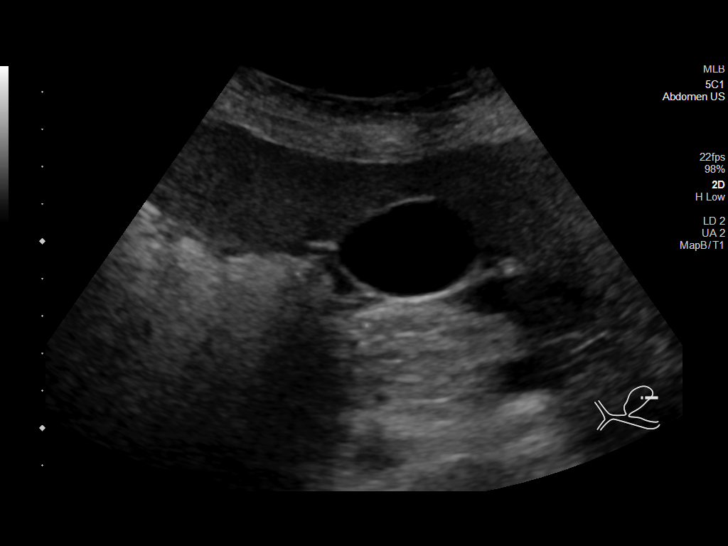
[im 10/40]
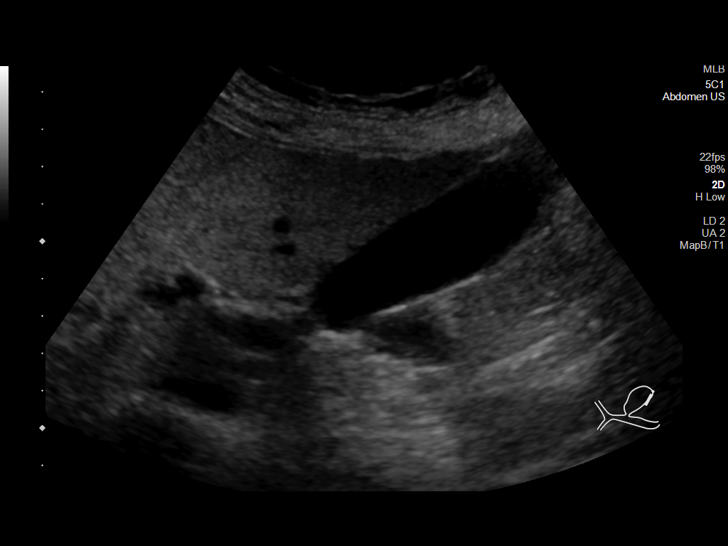
[im 14/40]
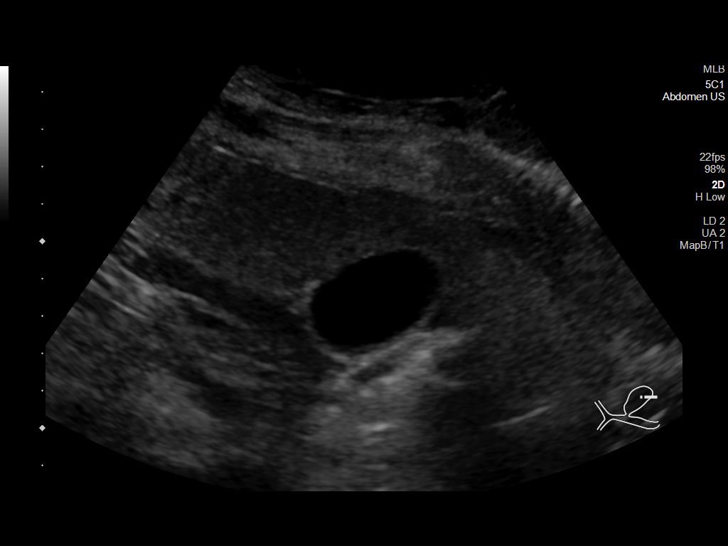
[im 15/40]
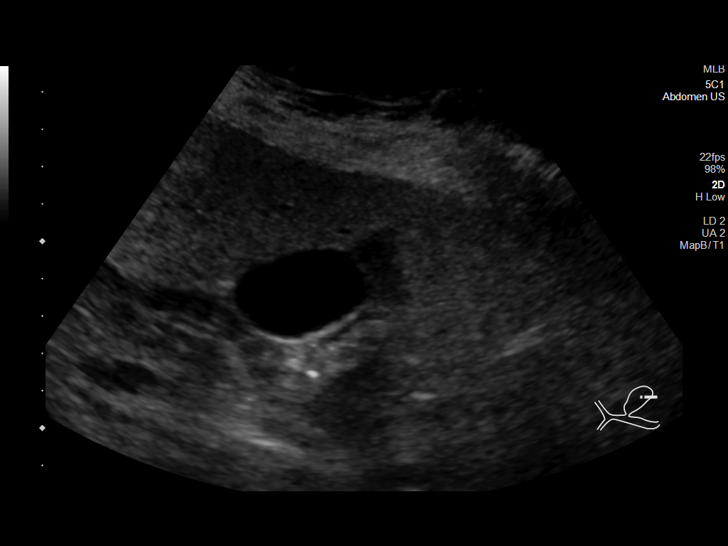
[im 18/40]
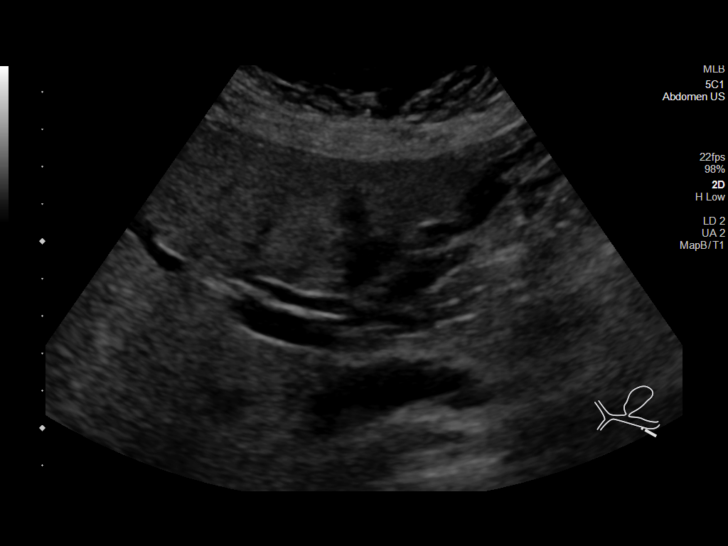
[im 22/40]
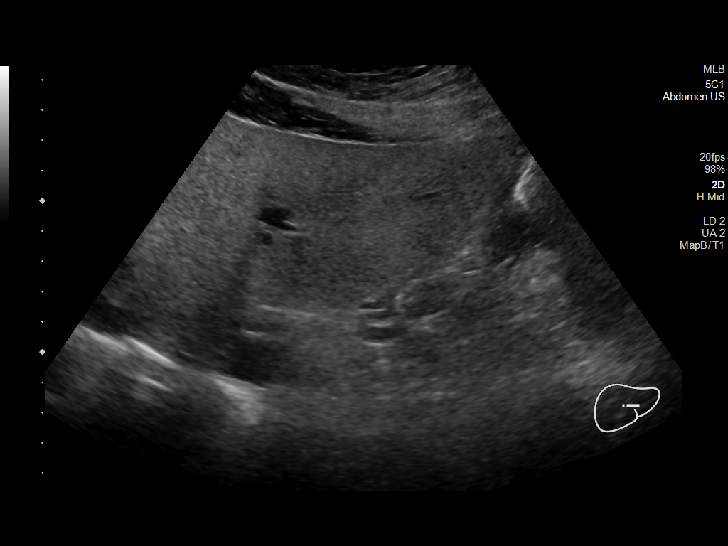
[im 25/40]
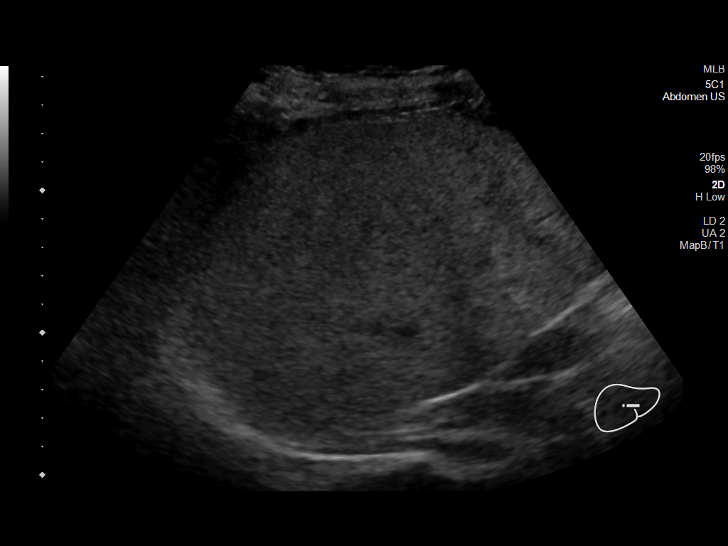
[im 27/40]
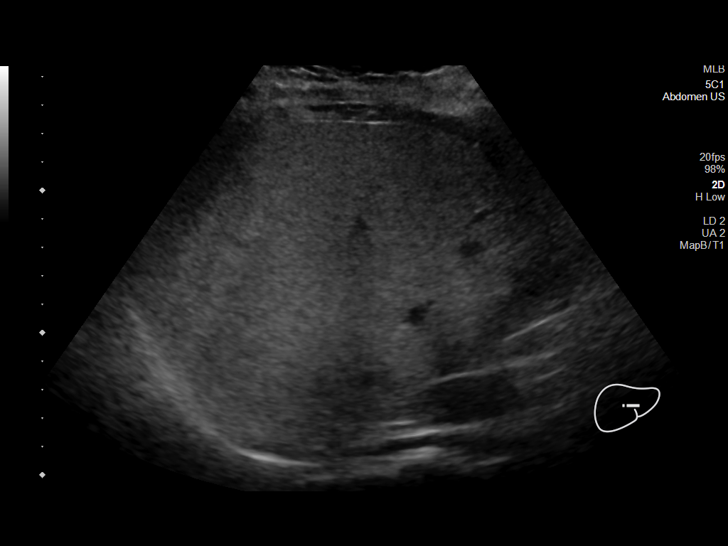
[im 30/40]
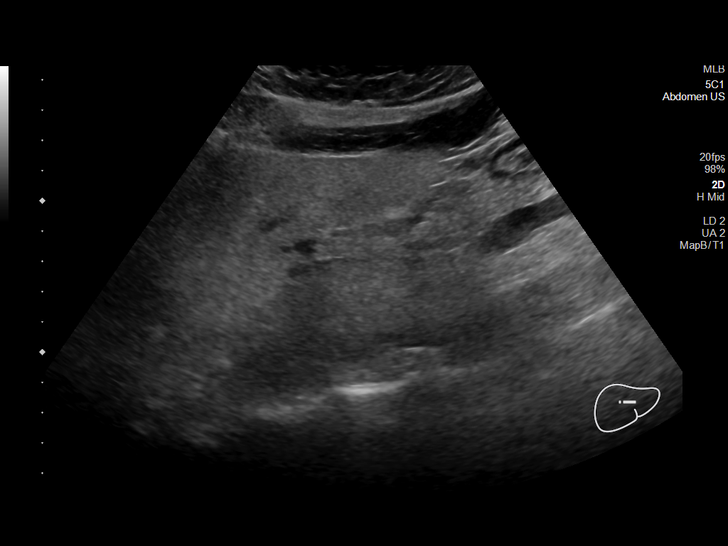
[im 33/40]
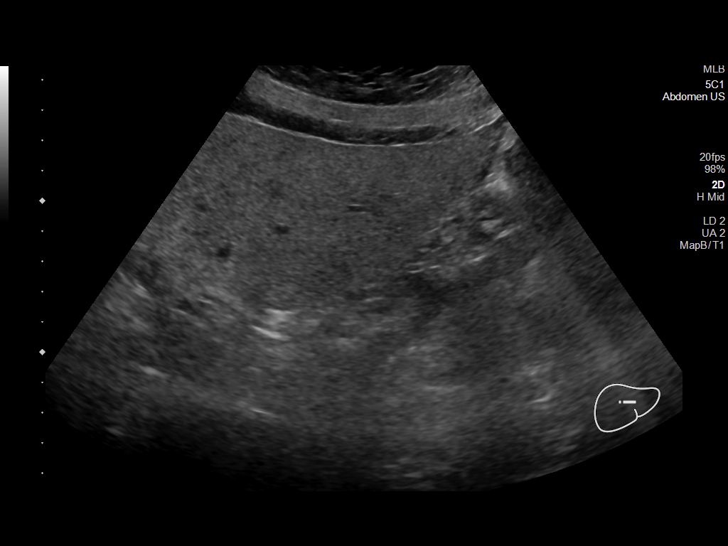
[im 36/40]
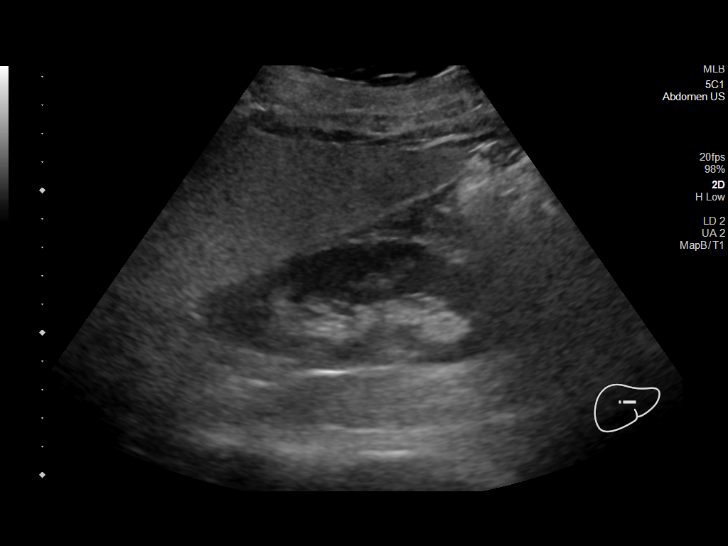
[im 40/40]
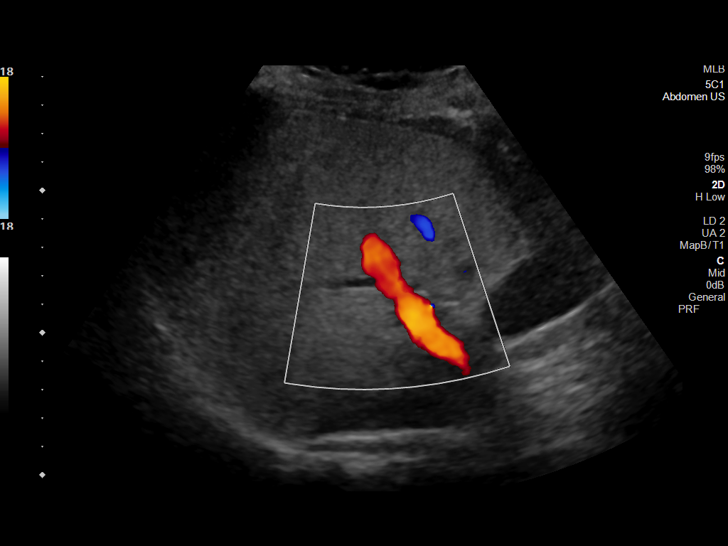

[14 of 25 positions shown; findings below may reference images not displayed]

FINDINGS: Gallbladder:

No gallstones or wall thickening visualized. No sonographic Murphy
sign noted by sonographer.

Common bile duct:

Diameter: 4 mm.

Liver:

No focal lesion identified. Increased and heterogeneous parenchymal
echogenicity. Portal vein is patent on color Doppler imaging with
normal direction of blood flow towards the liver.

Other: None.
IMPRESSION: Hepatic steatosis with slightly heterogeneous hepatic parenchyma.
Please note limited evaluation for focal hepatic masses in a patient
with hepatic steatosis due to decreased penetration of the acoustic
ultrasound waves. Recommend MRI liver protocol for further
evaluation.

## 2023-02-02 ENCOUNTER — Other Ambulatory Visit (HOSPITAL_COMMUNITY): Payer: Self-pay

## 2023-02-02 MED ORDER — VALACYCLOVIR HCL 1 G PO TABS
1.0000 g | ORAL_TABLET | Freq: Two times a day (BID) | ORAL | 2 refills | Status: AC | PRN
  Filled 2023-02-02 – 2023-03-28 (×4): qty 20, 10d supply, fill #0
  Filled 2023-10-14: qty 20, 10d supply, fill #1

## 2023-02-03 ENCOUNTER — Other Ambulatory Visit (HOSPITAL_COMMUNITY): Payer: Self-pay

## 2023-02-07 ENCOUNTER — Other Ambulatory Visit: Payer: Self-pay | Admitting: Physician Assistant

## 2023-02-07 DIAGNOSIS — F909 Attention-deficit hyperactivity disorder, unspecified type: Secondary | ICD-10-CM

## 2023-02-07 DIAGNOSIS — E559 Vitamin D deficiency, unspecified: Secondary | ICD-10-CM

## 2023-02-09 ENCOUNTER — Telehealth: Payer: Self-pay

## 2023-02-09 ENCOUNTER — Other Ambulatory Visit (HOSPITAL_COMMUNITY): Payer: Self-pay

## 2023-02-09 ENCOUNTER — Encounter: Payer: No Typology Code available for payment source | Admitting: Physician Assistant

## 2023-02-09 NOTE — Telephone Encounter (Signed)
She will need follow up before med refills because the last several appts she has rescheduled and these are controlled medications - can work with her to get in before December -- see what dates she is available and will see when we can work in

## 2023-02-09 NOTE — Telephone Encounter (Signed)
Patient called and left voicemail stating that she had to call and cancel her Appointment for Physical and med follow up because she states that she had Clinicals for that week or day and now she has been re-scheduled to December. She states that she needs her refill on her medications but she was denied refills so she is calling to ask if she can be worked in on a sooner day that works with her clinical schedule so she can get her medications or what does she need to do? Please advise.

## 2023-02-10 ENCOUNTER — Encounter: Payer: Self-pay | Admitting: Physician Assistant

## 2023-02-11 ENCOUNTER — Ambulatory Visit (INDEPENDENT_AMBULATORY_CARE_PROVIDER_SITE_OTHER): Payer: No Typology Code available for payment source | Admitting: Family Medicine

## 2023-02-11 ENCOUNTER — Encounter: Payer: Self-pay | Admitting: Family Medicine

## 2023-02-11 VITALS — BP 124/72 | HR 73 | Temp 97.9°F | Ht 62.25 in | Wt 147.0 lb

## 2023-02-11 DIAGNOSIS — E559 Vitamin D deficiency, unspecified: Secondary | ICD-10-CM | POA: Diagnosis not present

## 2023-02-11 DIAGNOSIS — F909 Attention-deficit hyperactivity disorder, unspecified type: Secondary | ICD-10-CM

## 2023-02-11 DIAGNOSIS — F330 Major depressive disorder, recurrent, mild: Secondary | ICD-10-CM

## 2023-02-11 DIAGNOSIS — F339 Major depressive disorder, recurrent, unspecified: Secondary | ICD-10-CM

## 2023-02-11 NOTE — Progress Notes (Unsigned)
Subjective:  Patient ID: Kaitlyn Sawyer, female    DOB: 06/07/1985  Age: 37 y.o. MRN: [id number]  Chief Complaint  Patient presents with   Medical Management of Chronic Issues    HPI   ADHD: Taking Adderall 20 mg 1 tablet in the AM and Adderall 10 mg 1 tablet in the afternoon. Helps her focus, but often forgets the adderal 10 mg. Wondering if she could try xr.   Depression: Taking Sertraline 50 mg 1 tablet daily.  Vitamin D: Taking Vitamin D 50,000 mg 1 capsule once weekly.       02/11/2023    9:54 AM 08/16/2022    3:30 PM 05/13/2022    3:22 PM 02/09/2022    3:51 PM 11/05/2021    4:02 PM  Depression screen PHQ 2/9  Decreased Interest 0 0 0 0 0  Down, Depressed, Hopeless 0 0 0 0 1  PHQ - 2 Score 0 0 0 0 1  Altered sleeping 1 0 0 1 1  Tired, decreased energy 1 0 1 3 3   Change in appetite 0 0 0 0 0  Feeling bad or failure about yourself  0 0 0 0 0  Trouble concentrating 0 0 0 0 1  Moving slowly or fidgety/restless 0 0 0 0 0  Suicidal thoughts 0 0 0 0 0  PHQ-9 Score 2 0 1 4 6   Difficult doing work/chores Not difficult at all Not difficult at all Not difficult at all Not difficult at all Not difficult at all        02/11/2023    9:56 AM  Fall Risk   Falls in the past year? 0  Number falls in past yr: 0  Injury with Fall? 0  Risk for fall due to : No Fall Risks  Follow up Falls evaluation completed    Patient Care Team: Marianne Sofia, Cordelia Poche as PCP - General (Physician Assistant)   Review of Systems  Constitutional:  Negative for appetite change, fatigue and fever.  HENT:  Negative for congestion, ear pain, sinus pressure and sore throat.   Respiratory:  Negative for cough, chest tightness, shortness of breath and wheezing.   Cardiovascular:  Negative for chest pain and palpitations.  Gastrointestinal:  Negative for abdominal pain, constipation, diarrhea, nausea and vomiting.  Genitourinary:  Negative for dysuria and hematuria.  Musculoskeletal:  Negative for  arthralgias, back pain, joint swelling and myalgias.  Skin:  Negative for rash.  Neurological:  Negative for dizziness, weakness and headaches.  Psychiatric/Behavioral:  Negative for dysphoric mood. The patient is not nervous/anxious.     Current Outpatient Medications on File Prior to Visit  Medication Sig Dispense Refill   sertraline (ZOLOFT) 50 MG tablet Take 1 tablet (50 mg total) by mouth daily. 90 tablet 3   valACYclovir (VALTREX) 1000 MG tablet Take 1 tablet (1,000 mg total) by mouth 2 (two) times daily on an as needed basis. 20 tablet 2   Vitamin D, Ergocalciferol, (DRISDOL) 1.25 MG (50000 UNIT) CAPS capsule Take 1 capsule by mouth every 7 days. 5 capsule 2   No current facility-administered medications on file prior to visit.   Past Medical History:  Diagnosis Date   Heart murmur    asymptomatic with exception during pregancy notice occ. palpitations   History of abnormal cervical Pap smear    HPV-- S/P CRYOTHERPY   History of gestational hypertension    DUE TO ECLAMPSIA   History of postpartum depression    History of seizure  x1  secondary to preclampsia 2011 (approx)  none since   Past Surgical History:  Procedure Laterality Date   DILATION AND CURETTAGE OF UTERUS     post partem retained placenta   LAPAROSCOPIC TUBAL LIGATION Bilateral 01/23/2015   Procedure: LAPAROSCOPIC TUBAL LIGATION;  Surgeon: Marcelle Overlie, MD;  Location: [facility];  Service: Gynecology;  Laterality: Bilateral;   uterine ablasion  01/06/2017   WISDOM TOOTH EXTRACTION  age 52    Family History  Problem Relation Age of Onset   Alcohol abuse Maternal Grandfather        liver failure   Heart disease Paternal Grandfather        "massive MI"   Hyperlipidemia Paternal Grandfather    Heart attack Paternal Grandfather    Stroke Paternal Grandmother    Hyperlipidemia Paternal Grandmother    Alcohol abuse Mother    Social History   Socioeconomic History   Marital status:  Divorced    Spouse name: Not on file   Number of children: 4   Years of education: Not on file   Highest education level: Associate degree: occupational, Scientist, product/process development, or vocational program  Occupational History   Occupation: Sales executive  Tobacco Use   Smoking status: Former    Current packs/day: 0.00    Average packs/day: 0.5 packs/day for 10.0 years (5.0 ttl pk-yrs)    Types: Cigarettes    Start date: 04/20/2007    Quit date: 04/19/2017    Years since quitting: 5.8   Smokeless tobacco: Never  Vaping Use   Vaping status: Never Used  Substance and Sexual Activity   Alcohol use: Yes    Comment: social   Drug use: No   Sexual activity: Yes  Other Topics Concern   Not on file  Social History Narrative   Not on file   Social Determinants of Health   Financial Resource Strain: Low Risk  (08/16/2022)   Overall Financial Resource Strain (CARDIA)    Difficulty of Paying Living Expenses: Not very hard  Food Insecurity: No Food Insecurity (08/16/2022)   Hunger Vital Sign    Worried About Running Out of Food in the Last Year: Never true    Ran Out of Food in the Last Year: Never true  Transportation Needs: No Transportation Needs (08/16/2022)   PRAPARE - Administrator, Civil Service (Medical): No    Lack of Transportation (Non-Medical): No  Physical Activity: Insufficiently Active (08/16/2022)   Exercise Vital Sign    Days of Exercise per Week: 3 days    Minutes of Exercise per Session: 30 min  Stress: No Stress Concern Present (08/16/2022)   [employer] of Occupational Health - Occupational Stress Questionnaire    Feeling of Stress : Only a little  Social Connections: Moderately Isolated (08/16/2022)   Social Connection and Isolation Panel [NHANES]    Frequency of Communication with Friends and Family: Three times a week    Frequency of Social Gatherings with Friends and Family: Once a week    Attends Religious Services: More than 4 times per year    Active Member  of [employer] or Organizations: No    Attends Engineer, structural: Not on file    Marital Status: Divorced    Objective:  BP 124/72 (BP Location: Left Arm, Patient Position: Sitting)   Pulse 73   Temp 97.9 F (36.6 C) (Temporal)   Ht 5' 2.25" (1.581 m)   Wt 147 lb (66.7 kg)   SpO2 98%  BMI 26.67 kg/m      02/11/2023    9:56 AM 08/16/2022    3:29 PM 05/13/2022    3:17 PM  BP/Weight  Systolic BP 124 112 128  Diastolic BP 72 74 74  Wt. (Lbs) 147 145 146  BMI 26.67 kg/m2 26.31 kg/m2 26.28 kg/m2    Physical Exam Vitals reviewed.  Constitutional:      Appearance: Normal appearance. She is normal weight.  Cardiovascular:     Rate and Rhythm: Normal rate and regular rhythm.     Heart sounds: Normal heart sounds.  Pulmonary:     Effort: Pulmonary effort is normal. No respiratory distress.     Breath sounds: Normal breath sounds.  Neurological:     Mental Status: She is alert and oriented to person, place, and time.  Psychiatric:        Mood and Affect: Mood normal.        Behavior: Behavior normal.     Diabetic Foot Exam - Simple   No data filed      Lab Results  Component Value Date   WBC 7.2 11/05/2021   HGB 13.0 11/05/2021   HCT 37.7 11/05/2021   PLT 296 11/05/2021   GLUCOSE 97 11/05/2021   CHOL 154 12/10/2020   TRIG 103 12/10/2020   HDL 30 (L) 12/10/2020   LDLCALC 105 (H) 12/10/2020   ALT 14 11/05/2021   AST 14 11/05/2021   NA 139 11/05/2021   K 4.1 11/05/2021   CL 101 11/05/2021   CREATININE 0.67 11/05/2021   BUN 12 11/05/2021   CO2 28 11/05/2021   TSH 1.100 11/05/2021      Assessment & Plan:    Vitamin D deficiency Assessment & Plan: The current medical regimen is effective;  continue present plan and medications.  Taking Vitamin D 50,000 mg 1 capsule once weekly.    Mild recurrent major depression (HCC) Assessment & Plan: The current medical regimen is effective;  continue present plan and medications.  Taking Sertraline 50 mg 1  tablet daily.    Adult ADHD Assessment & Plan: The current medical regimen is effective;  continue present plan and medications.  Sent Adderall XR   Orders: -     Amphetamine-Dextroamphet ER; Take 1 capsule (20 mg total) by mouth every morning.  Dispense: 30 capsule; Refill: 0     Meds ordered this encounter  Medications   amphetamine-dextroamphetamine (ADDERALL XR) 20 MG 24 hr capsule    Sig: Take 1 capsule (20 mg total) by mouth every morning.    Dispense:  30 capsule    Refill:  0    No orders of the defined types were placed in this encounter.    Follow-up: No follow-ups on file.   I,Lauren M Auman,acting as a scribe for Blane Ohara, MD.,have documented all relevant documentation on the behalf of Blane Ohara, MD,as directed by  Blane Ohara, MD while in the presence of Blane Ohara, MD.   Clayborn Bigness I Leal-Borjas,acting as a scribe for Blane Ohara, MD.,have documented all relevant documentation on the behalf of Blane Ohara, MD,as directed by  Blane Ohara, MD while in the presence of Blane Ohara, MD.    An After Visit Summary was printed and given to the patient.  Blane Ohara, MD [facility] [phone]

## 2023-02-12 ENCOUNTER — Other Ambulatory Visit (HOSPITAL_COMMUNITY): Payer: Self-pay

## 2023-02-13 DIAGNOSIS — E559 Vitamin D deficiency, unspecified: Secondary | ICD-10-CM | POA: Insufficient documentation

## 2023-02-13 MED ORDER — AMPHETAMINE-DEXTROAMPHET ER 20 MG PO CP24: 20.0000 mg | ORAL_CAPSULE | ORAL | 0 refills | Status: DC

## 2023-02-13 NOTE — Assessment & Plan Note (Addendum)
The current medical regimen is effective;  continue present plan and medications.  Taking Vitamin D 50,000 mg 1 capsule once weekly.

## 2023-02-13 NOTE — Assessment & Plan Note (Signed)
The current medical regimen is effective;  continue present plan and medications.  Sent Adderall XR

## 2023-02-13 NOTE — Assessment & Plan Note (Signed)
The current medical regimen is effective;  continue present plan and medications.  Taking Sertraline 50 mg 1 tablet daily.

## 2023-02-14 ENCOUNTER — Encounter: Payer: Self-pay | Admitting: Family Medicine

## 2023-02-15 ENCOUNTER — Other Ambulatory Visit (HOSPITAL_COMMUNITY): Payer: Self-pay

## 2023-02-15 ENCOUNTER — Other Ambulatory Visit: Payer: Self-pay | Admitting: Family Medicine

## 2023-02-15 MED ORDER — AMPHETAMINE-DEXTROAMPHETAMINE 10 MG PO TABS
10.0000 mg | ORAL_TABLET | Freq: Every day | ORAL | 0 refills | Status: DC
  Filled 2023-02-15: qty 30, 30d supply, fill #0

## 2023-02-15 MED ORDER — AMPHETAMINE-DEXTROAMPHETAMINE 20 MG PO TABS
20.0000 mg | ORAL_TABLET | Freq: Every day | ORAL | 0 refills | Status: DC
  Filled 2023-02-15: qty 30, 30d supply, fill #0

## 2023-02-15 MED ORDER — AMPHETAMINE-DEXTROAMPHET ER 20 MG PO CP24
20.0000 mg | ORAL_CAPSULE | Freq: Every morning | ORAL | 0 refills | Status: DC
Start: 2023-02-11 — End: 2023-02-15
  Filled 2023-02-15: qty 30, 30d supply, fill #0

## 2023-03-08 ENCOUNTER — Other Ambulatory Visit (HOSPITAL_COMMUNITY): Payer: Self-pay

## 2023-03-15 ENCOUNTER — Other Ambulatory Visit (HOSPITAL_BASED_OUTPATIENT_CLINIC_OR_DEPARTMENT_OTHER): Payer: Self-pay

## 2023-03-15 ENCOUNTER — Other Ambulatory Visit: Payer: Self-pay | Admitting: Physician Assistant

## 2023-03-15 ENCOUNTER — Other Ambulatory Visit: Payer: Self-pay | Admitting: Family Medicine

## 2023-03-15 DIAGNOSIS — E559 Vitamin D deficiency, unspecified: Secondary | ICD-10-CM

## 2023-03-15 MED ORDER — AMPHETAMINE-DEXTROAMPHETAMINE 20 MG PO TABS
20.0000 mg | ORAL_TABLET | Freq: Every day | ORAL | 0 refills | Status: DC
  Filled 2023-03-15: qty 30, 30d supply, fill #0

## 2023-03-15 MED ORDER — VITAMIN D (ERGOCALCIFEROL) 1.25 MG (50000 UNIT) PO CAPS
50000.0000 [IU] | ORAL_CAPSULE | ORAL | 0 refills | Status: DC
  Filled 2023-03-15: qty 5, 35d supply, fill #0

## 2023-03-15 MED ORDER — AMPHETAMINE-DEXTROAMPHETAMINE 10 MG PO TABS
10.0000 mg | ORAL_TABLET | Freq: Every day | ORAL | 0 refills | Status: DC
  Filled 2023-03-15: qty 30, 30d supply, fill #0

## 2023-03-16 ENCOUNTER — Other Ambulatory Visit (HOSPITAL_BASED_OUTPATIENT_CLINIC_OR_DEPARTMENT_OTHER): Payer: Self-pay

## 2023-03-16 ENCOUNTER — Other Ambulatory Visit: Payer: Self-pay

## 2023-03-16 MED ORDER — AMPHETAMINE-DEXTROAMPHETAMINE 20 MG PO TABS: 20.0000 mg | ORAL_TABLET | Freq: Every day | ORAL | 0 refills | Status: DC

## 2023-03-16 MED ORDER — AMPHETAMINE-DEXTROAMPHETAMINE 10 MG PO TABS: 10.0000 mg | ORAL_TABLET | Freq: Every day | ORAL | 0 refills | Status: DC

## 2023-03-16 NOTE — Telephone Encounter (Signed)
Copied from CRM [id number]. Topic: Clinical - Medication Question >> Mar 16, 2023 11:55 AM Larwance Sachs wrote: Reason for CRM: Patient called regarding amphetamine-dextroamphetamine (ADDERALL) 10 MG tablet and amphetamine-dextroamphetamine (ADDERALL) 20 MG tablet, stated medication was called into [facility] and will not be available until next week. Patient stated having enough medication for 2 days left and she will be out. Patient is wondering can she have both prescriptions called into [facility] since they have medication stock

## 2023-03-18 ENCOUNTER — Other Ambulatory Visit (HOSPITAL_BASED_OUTPATIENT_CLINIC_OR_DEPARTMENT_OTHER): Payer: Self-pay

## 2023-03-23 ENCOUNTER — Encounter: Payer: Self-pay | Admitting: Physician Assistant

## 2023-03-23 ENCOUNTER — Ambulatory Visit (INDEPENDENT_AMBULATORY_CARE_PROVIDER_SITE_OTHER): Payer: No Typology Code available for payment source | Admitting: Physician Assistant

## 2023-03-23 VITALS — BP 112/62 | HR 100 | Temp 97.9°F | Resp 16 | Ht 62.25 in | Wt 149.4 lb

## 2023-03-23 DIAGNOSIS — Z23 Encounter for immunization: Secondary | ICD-10-CM | POA: Diagnosis not present

## 2023-03-23 DIAGNOSIS — E559 Vitamin D deficiency, unspecified: Secondary | ICD-10-CM | POA: Diagnosis not present

## 2023-03-23 DIAGNOSIS — Z0000 Encounter for general adult medical examination without abnormal findings: Secondary | ICD-10-CM | POA: Diagnosis not present

## 2023-03-23 DIAGNOSIS — M533 Sacrococcygeal disorders, not elsewhere classified: Secondary | ICD-10-CM

## 2023-03-23 DIAGNOSIS — Z1231 Encounter for screening mammogram for malignant neoplasm of breast: Secondary | ICD-10-CM

## 2023-03-23 LAB — POCT URINALYSIS DIP (CLINITEK)
Bilirubin, UA: NEGATIVE
Blood, UA: NEGATIVE
Glucose, UA: NEGATIVE mg/dL
Ketones, POC UA: NEGATIVE mg/dL
Leukocytes, UA: NEGATIVE
Nitrite, UA: NEGATIVE
POC PROTEIN,UA: NEGATIVE
Spec Grav, UA: >=1.03 — AB (ref 1.010–1.025)
Urobilinogen, UA: 0.2 U/dL
pH, UA: 6 (ref 5.0–8.0)

## 2023-03-23 NOTE — Progress Notes (Signed)
Subjective:  Patient ID: Kaitlyn Sawyer, female    DOB: 1985-08-18  Age: 37 y.o. MRN: [id number]  Chief Complaint  Patient presents with   Annual Exam    HPI Well Adult Physical: Patient here for a comprehensive physical exam.The patient reports  has had sacral pain with sitting for almost a year - does stoop and bend a lot at work - pain not bothersome with standing or walking Do you take any herbs or supplements that were not prescribed by a doctor? no Are you taking calcium supplements? no Are you taking aspirin daily? no  Encounter for general adult medical examination without abnormal findings  Physical ("At Risk" items are starred): Patient's last physical exam was 1 year ago .  Patient is not afflicted from Stress Incontinence and Urge Incontinence  Patient wears a seat belts Patient has smoke detectors and has carbon monoxide detectors. Patient practices appropriate gun safety. Patient wears sunscreen with extended sun exposure. Dental Care: brushes and flosses daily. Last dental visit: up to date Vision impairments: wears glasses Ophthalmology/Optometry: Annual visit.  Hearing loss: none  No LMP recorded. Patient has had an ablation.  Safe at home: Yes Self breast exams: Yes Last pap: up to date with GYN Last mammogram: due for first screening exam Would like Tdap      03/23/2023   11:05 AM 02/11/2023    9:54 AM 08/16/2022    3:30 PM 05/13/2022    3:22 PM 02/09/2022    3:51 PM  Depression screen PHQ 2/9  Decreased Interest 0 0 0 0 0  Down, Depressed, Hopeless 0 0 0 0 0  PHQ - 2 Score 0 0 0 0 0  Altered sleeping 0 1 0 0 1  Tired, decreased energy 1 1 0 1 3  Change in appetite 0 0 0 0 0  Feeling bad or failure about yourself  0 0 0 0 0  Trouble concentrating 0 0 0 0 0  Moving slowly or fidgety/restless 0 0 0 0 0  Suicidal thoughts 0 0 0 0 0  PHQ-9 Score 1 2 0 1 4  Difficult doing work/chores Somewhat difficult Not difficult at all Not difficult at all Not  difficult at all Not difficult at all         02/09/2022    3:51 PM 05/05/2022    3:20 PM 08/16/2022    3:30 PM 02/11/2023    9:56 AM 03/23/2023   11:05 AM  Fall Risk  Falls in the past year? 0 0 0 0 0  Was there an injury with Fall? 0 0 0 0 0  Fall Risk Category Calculator 0 0 0 0 0  Fall Risk Category (Retired) Low      (RETIRED) Patient Fall Risk Level Low fall risk      Patient at Risk for Falls Due to No Fall Risks No Fall Risks No Fall Risks No Fall Risks No Fall Risks  Fall risk Follow up Falls evaluation completed Falls evaluation completed Falls evaluation completed Falls evaluation completed Falls evaluation completed             Social Hx   Social History   Socioeconomic History   Marital status: Divorced    Spouse name: Not on file   Number of children: 4   Years of education: Not on file   Highest education level: Associate degree: occupational, Scientist, product/process development, or vocational program  Occupational History   Occupation: Sales executive  Tobacco Use   Smoking  status: Former    Current packs/day: 0.00    Average packs/day: 0.5 packs/day for 10.0 years (5.0 ttl pk-yrs)    Types: Cigarettes    Start date: 04/20/2007    Quit date: 04/19/2017    Years since quitting: 5.9   Smokeless tobacco: Never  Vaping Use   Vaping status: Never Used  Substance and Sexual Activity   Alcohol use: Yes    Comment: social   Drug use: No   Sexual activity: Yes  Other Topics Concern   Not on file  Social History Narrative   Not on file   Social Determinants of Health   Financial Resource Strain: Low Risk  (08/16/2022)   Overall Financial Resource Strain (CARDIA)    Difficulty of Paying Living Expenses: Not very hard  Food Insecurity: No Food Insecurity (08/16/2022)   Hunger Vital Sign    Worried About Running Out of Food in the Last Year: Never true    Ran Out of Food in the Last Year: Never true  Transportation Needs: No Transportation Needs (08/16/2022)   PRAPARE -  Administrator, Civil Service (Medical): No    Lack of Transportation (Non-Medical): No  Physical Activity: Insufficiently Active (08/16/2022)   Exercise Vital Sign    Days of Exercise per Week: 3 days    Minutes of Exercise per Session: 30 min  Stress: No Stress Concern Present (08/16/2022)   [employer] of Occupational Health - Occupational Stress Questionnaire    Feeling of Stress : Only a little  Social Connections: Moderately Isolated (08/16/2022)   Social Connection and Isolation Panel [NHANES]    Frequency of Communication with Friends and Family: Three times a week    Frequency of Social Gatherings with Friends and Family: Once a week    Attends Religious Services: More than 4 times per year    Active Member of [employer] or Organizations: No    Attends Engineer, structural: Not on file    Marital Status: Divorced   Past Medical History:  Diagnosis Date   Heart murmur    asymptomatic with exception during pregancy notice occ. palpitations   History of abnormal cervical Pap smear    HPV-- S/P CRYOTHERPY   History of gestational hypertension    DUE TO ECLAMPSIA   History of postpartum depression    History of seizure    x1  secondary to preclampsia 2011 (approx)  none since   Past Surgical History:  Procedure Laterality Date   DILATION AND CURETTAGE OF UTERUS     post partem retained placenta   LAPAROSCOPIC TUBAL LIGATION Bilateral 01/23/2015   Procedure: LAPAROSCOPIC TUBAL LIGATION;  Surgeon: Marcelle Overlie, MD;  Location: [facility] [place];  Service: Gynecology;  Laterality: Bilateral;   uterine ablasion  01/06/2017   WISDOM TOOTH EXTRACTION  age 38    Family History  Problem Relation Age of Onset   Alcohol abuse Maternal Grandfather        liver failure   Heart disease Paternal Grandfather        "massive MI"   Hyperlipidemia Paternal Grandfather    Heart attack Paternal Grandfather    Stroke Paternal Grandmother    Hyperlipidemia  Paternal Grandmother    Alcohol abuse Mother     ROS CONSTITUTIONAL: Negative for chills, fatigue, fever, unintentional weight gain and unintentional weight loss.  E/N/T: Negative for ear pain, nasal congestion and sore throat.  CARDIOVASCULAR: Negative for chest pain, dizziness, palpitations and pedal edema.  RESPIRATORY:  Negative for recent cough and dyspnea.  GASTROINTESTINAL: Negative for abdominal pain, acid reflux symptoms, constipation, diarrhea, nausea and vomiting.  MSK: see HPI INTEGUMENTARY: Negative for rash.  NEUROLOGICAL: Negative for dizziness and headaches.  PSYCHIATRIC: Negative for sleep disturbance and to question depression screen.  Negative for depression, negative for anhedonia.   Objective:  PHYSICAL EXAM:   BP 112/62 (BP Location: Left Arm, Patient Position: Sitting, Cuff Size: Normal)   Pulse 100   Temp 97.9 F (36.6 C) (Temporal)   Resp 16   Ht 5' 2.25" (1.581 m)   Wt 149 lb 6.4 oz (67.8 kg)   SpO2 98%   BMI 27.11 kg/m   Vision Screening   Right eye Left eye Both eyes  Without correction     With correction 20/15 20/15 20/15     GEN: Well nourished, well developed, in no acute distress  HEENT: normal external ears and nose - normal external auditory canals and TMS - hearing grossly normal -  - Lips, Teeth and Gums - normal  Oropharynx - normal mucosa, palate, and posterior pharynx Cardiac: RRR; no murmurs, rubs, or gallops,no edema - no significant varicosities Respiratory:  normal respiratory rate and pattern with no distress - normal breath sounds with no rales, rhonchi, wheezes or rubs GI: normal bowel sounds, no masses or tenderness MS: no deformity or atrophy  Skin: warm and dry, no rash  Neuro:  Alert and Oriented x 3, Strength and sensation are intact - CN II-Xii grossly intact Psych: euthymic mood, appropriate affect and demeanor Office Visit on 03/23/2023  Component Date Value Ref Range Status   Color, UA 03/23/2023 yellow  yellow Final    Clarity, UA 03/23/2023 clear  clear Final   Glucose, UA 03/23/2023 negative  negative mg/dL Final   Bilirubin, UA 40/98/1191 negative  negative Final   Ketones, POC UA 03/23/2023 negative  negative mg/dL Final   Spec Grav, UA 47/82/9562 >=1.030 (A)  1.010 - 1.025 Final   Blood, UA 03/23/2023 negative  negative Final   pH, UA 03/23/2023 6.0  5.0 - 8.0 Final   POC PROTEIN,UA 03/23/2023 negative  negative, trace Final   Urobilinogen, UA 03/23/2023 0.2  0.2 or 1.0 E.U./dL Final   Nitrite, UA 13/11/6576 Negative  Negative Final   Leukocytes, UA 03/23/2023 Negative  Negative Final     Assessment & Plan:  Annual physical exam -     POCT URINALYSIS DIP (CLINITEK) -     CBC with Differential/Platelet -     Comprehensive metabolic panel -     TSH -     Lipid panel -     VITAMIN D 25 Hydroxy (Vit-D Deficiency, Fractures) -     Tdap vaccine greater than or equal to 7yo IM  Vitamin D deficiency -     VITAMIN D 25 Hydroxy (Vit-D Deficiency, Fractures)  Need for diphtheria-tetanus-pertussis (Tdap) vaccine -     Tdap vaccine greater than or equal to 7yo IM  Pain in sacrum -     DG Sacrum/Coccyx; Future  Encounter for screening mammogram for breast cancer -     3D Screening Mammogram, Left and Right; Future    This is a list of the screening recommended for you and due dates:  Health Maintenance  Topic Date Due   Pap with HPV screening  03/22/2024*   DTaP/Tdap/Td vaccine (2 - Td or Tdap) 03/22/2033   Flu Shot  Completed   HIV Screening  Completed   HPV Vaccine  Aged Out  COVID-19 Vaccine  Discontinued   Hepatitis C Screening  Discontinued  *Topic was postponed. The date shown is not the original due date.     Follow-up: Return in about 6 months (around 09/21/2023) for follow-up.  An After Visit Summary was printed and given to the patient.  Jettie Pagan [facility] [phone]

## 2023-03-24 LAB — COMPREHENSIVE METABOLIC PANEL
ALT: 11 [IU]/L (ref 0–32)
AST: 21 [IU]/L (ref 0–40)
Albumin: 4.6 g/dL (ref 3.9–4.9)
Alkaline Phosphatase: 47 [IU]/L (ref 44–121)
BUN/Creatinine Ratio: 14 (ref 9–23)
BUN: 11 mg/dL (ref 6–20)
Bilirubin Total: 0.5 mg/dL (ref 0.0–1.2)
CO2: 24 mmol/L (ref 20–29)
Calcium: 9.5 mg/dL (ref 8.7–10.2)
Chloride: 102 mmol/L (ref 96–106)
Creatinine, Ser: 0.76 mg/dL (ref 0.57–1.00)
Globulin, Total: 2.4 g/dL (ref 1.5–4.5)
Glucose: 83 mg/dL (ref 70–99)
Potassium: 5 mmol/L (ref 3.5–5.2)
Sodium: 140 mmol/L (ref 134–144)
Total Protein: 7 g/dL (ref 6.0–8.5)
eGFR: 103 mL/min/{1.73_m2} (ref >59)

## 2023-03-24 LAB — CBC WITH DIFFERENTIAL/PLATELET
Basophils Absolute: 0.1 10*3/uL (ref 0.0–0.2)
Basos: 1 %
EOS (ABSOLUTE): 0.2 10*3/uL (ref 0.0–0.4)
Eos: 3 %
Hematocrit: 40 % (ref 34.0–46.6)
Hemoglobin: 13 g/dL (ref 11.1–15.9)
Immature Grans (Abs): 0 10*3/uL (ref 0.0–0.1)
Immature Granulocytes: 0 %
Lymphocytes Absolute: 2.1 10*3/uL (ref 0.7–3.1)
Lymphs: 34 %
MCH: 28.3 pg (ref 26.6–33.0)
MCHC: 32.5 g/dL (ref 31.5–35.7)
MCV: 87 fL (ref 79–97)
Monocytes Absolute: 0.3 10*3/uL (ref 0.1–0.9)
Monocytes: 5 %
Neutrophils Absolute: 3.5 10*3/uL (ref 1.4–7.0)
Neutrophils: 57 %
Platelets: 309 10*3/uL (ref 150–450)
RBC: 4.6 x10E6/uL (ref 3.77–5.28)
RDW: 12.3 % (ref 11.7–15.4)
WBC: 6.1 10*3/uL (ref 3.4–10.8)

## 2023-03-24 LAB — LIPID PANEL
Chol/HDL Ratio: 4.3 {ratio} (ref 0.0–4.4)
Cholesterol, Total: 166 mg/dL (ref 100–199)
HDL: 39 mg/dL — ABNORMAL LOW (ref >39)
LDL Chol Calc (NIH): 114 mg/dL — ABNORMAL HIGH (ref 0–99)
Triglycerides: 64 mg/dL (ref 0–149)
VLDL Cholesterol Cal: 13 mg/dL (ref 5–40)

## 2023-03-24 LAB — VITAMIN D 25 HYDROXY (VIT D DEFICIENCY, FRACTURES): Vit D, 25-Hydroxy: 54.4 ng/mL (ref 30.0–100.0)

## 2023-03-24 LAB — TSH: TSH: 1.23 u[IU]/mL (ref 0.450–4.500)

## 2023-03-28 ENCOUNTER — Other Ambulatory Visit (HOSPITAL_BASED_OUTPATIENT_CLINIC_OR_DEPARTMENT_OTHER): Payer: Self-pay

## 2023-04-08 ENCOUNTER — Other Ambulatory Visit (HOSPITAL_BASED_OUTPATIENT_CLINIC_OR_DEPARTMENT_OTHER): Payer: Self-pay

## 2023-04-14 ENCOUNTER — Other Ambulatory Visit: Payer: Self-pay | Admitting: Physician Assistant

## 2023-04-14 MED ORDER — AMPHETAMINE-DEXTROAMPHETAMINE 10 MG PO TABS
10.0000 mg | ORAL_TABLET | Freq: Every day | ORAL | 0 refills | Status: DC
  Filled 2023-04-14: qty 30, 30d supply, fill #0

## 2023-04-14 MED ORDER — AMPHETAMINE-DEXTROAMPHETAMINE 20 MG PO TABS
20.0000 mg | ORAL_TABLET | Freq: Every day | ORAL | 0 refills | Status: DC
  Filled 2023-04-14: qty 30, 30d supply, fill #0

## 2023-04-15 ENCOUNTER — Other Ambulatory Visit (HOSPITAL_BASED_OUTPATIENT_CLINIC_OR_DEPARTMENT_OTHER): Payer: Self-pay

## 2023-05-11 ENCOUNTER — Other Ambulatory Visit: Payer: Self-pay | Admitting: Physician Assistant

## 2023-05-11 ENCOUNTER — Other Ambulatory Visit (HOSPITAL_BASED_OUTPATIENT_CLINIC_OR_DEPARTMENT_OTHER): Payer: Self-pay

## 2023-05-11 ENCOUNTER — Other Ambulatory Visit: Payer: Self-pay | Admitting: Family Medicine

## 2023-05-11 DIAGNOSIS — E559 Vitamin D deficiency, unspecified: Secondary | ICD-10-CM

## 2023-05-11 MED ORDER — AMPHETAMINE-DEXTROAMPHETAMINE 20 MG PO TABS
20.0000 mg | ORAL_TABLET | Freq: Every day | ORAL | 0 refills | Status: DC
  Filled 2023-05-11 – 2023-05-13 (×2): qty 30, 30d supply, fill #0

## 2023-05-11 MED ORDER — AMPHETAMINE-DEXTROAMPHETAMINE 10 MG PO TABS
10.0000 mg | ORAL_TABLET | Freq: Every day | ORAL | 0 refills | Status: DC
  Filled 2023-05-11 – 2023-05-13 (×2): qty 30, 30d supply, fill #0

## 2023-05-13 ENCOUNTER — Other Ambulatory Visit (HOSPITAL_BASED_OUTPATIENT_CLINIC_OR_DEPARTMENT_OTHER): Payer: Self-pay

## 2023-05-13 MED ORDER — VITAMIN D (ERGOCALCIFEROL) 1.25 MG (50000 UNIT) PO CAPS
50000.0000 [IU] | ORAL_CAPSULE | ORAL | 0 refills | Status: DC
  Filled 2023-05-13: qty 5, 35d supply, fill #0

## 2023-06-07 ENCOUNTER — Other Ambulatory Visit (HOSPITAL_BASED_OUTPATIENT_CLINIC_OR_DEPARTMENT_OTHER): Payer: Self-pay

## 2023-06-07 MED ORDER — GABAPENTIN 300 MG PO CAPS
300.0000 mg | ORAL_CAPSULE | Freq: Three times a day (TID) | ORAL | 1 refills | Status: DC
  Filled 2023-06-07: qty 90, 30d supply, fill #0
  Filled 2023-07-04: qty 90, 30d supply, fill #1

## 2023-06-16 ENCOUNTER — Other Ambulatory Visit: Payer: Self-pay | Admitting: Physician Assistant

## 2023-06-16 ENCOUNTER — Encounter: Payer: Self-pay | Admitting: Physician Assistant

## 2023-06-16 ENCOUNTER — Other Ambulatory Visit: Payer: Self-pay | Admitting: Family Medicine

## 2023-06-16 ENCOUNTER — Other Ambulatory Visit (HOSPITAL_BASED_OUTPATIENT_CLINIC_OR_DEPARTMENT_OTHER): Payer: Self-pay

## 2023-06-16 DIAGNOSIS — E559 Vitamin D deficiency, unspecified: Secondary | ICD-10-CM

## 2023-06-16 MED ORDER — VITAMIN D (ERGOCALCIFEROL) 1.25 MG (50000 UNIT) PO CAPS
50000.0000 [IU] | ORAL_CAPSULE | ORAL | 3 refills | Status: DC
  Filled 2023-06-16: qty 5, 35d supply, fill #0
  Filled 2023-07-18: qty 5, 35d supply, fill #1
  Filled 2023-08-26: qty 5, 35d supply, fill #2
  Filled 2023-10-14: qty 5, 35d supply, fill #3

## 2023-06-16 MED ORDER — AMPHETAMINE-DEXTROAMPHETAMINE 20 MG PO TABS
20.0000 mg | ORAL_TABLET | Freq: Every day | ORAL | 0 refills | Status: DC
  Filled 2023-06-16: qty 30, 30d supply, fill #0

## 2023-06-16 MED ORDER — AMPHETAMINE-DEXTROAMPHETAMINE 10 MG PO TABS
10.0000 mg | ORAL_TABLET | Freq: Every day | ORAL | 0 refills | Status: DC
  Filled 2023-06-16: qty 30, 30d supply, fill #0

## 2023-06-23 IMAGING — NM NM HEPATO W/GB/PHARM/[PERSON_NAME]
2 series · 12 of 12 positions shown · non-contrast
Comparison: Ultrasound 05/04/2021

CLINICAL DATA: Right upper quadrant pain

EXAM:
NUCLEAR MEDICINE HEPATOBILIARY IMAGING WITH GALLBLADDER EF
TECHNIQUE: Sequential images of the abdomen were obtained [DATE] minutes
following intravenous administration of radiopharmaceutical. After
oral ingestion of Ensure, gallbladder ejection fraction was
determined. At 60 min, normal ejection fraction is greater than 33%.
RADIOPHARMACEUTICALS:  5.5 mCi Xc-HHm  Choletec IV

[he hepatobiliary · 4.52mm/px · 6 of 60 frames shown (1 of 2)]
[frame 6/60]
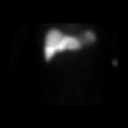
[frame 16/60]
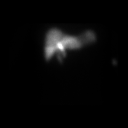
[frame 26/60]
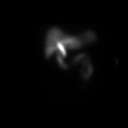
[frame 36/60]
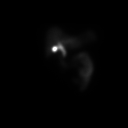
[frame 46/60]
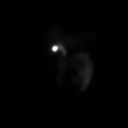
[frame 56/60]
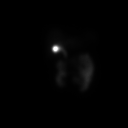

[he hepatobiliary · 4.52mm/px · 6 of 60 frames shown (2 of 2)]
[frame 6/60]
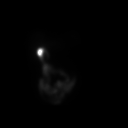
[frame 16/60]
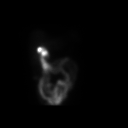
[frame 26/60]
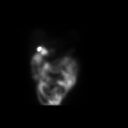
[frame 36/60]
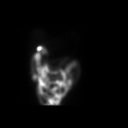
[frame 46/60]
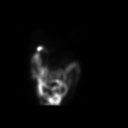
[frame 56/60]
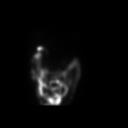

[12 of 12 positions shown; findings below may reference images not displayed]

FINDINGS: Prompt uptake and biliary excretion of activity by the liver is
seen. Gallbladder activity is visualized, consistent with patency of
cystic duct. Biliary activity passes into small bowel, consistent
with patent common bile duct.

Calculated gallbladder ejection fraction is 93%. (Normal gallbladder
ejection fraction with Ensure is greater than 33%.)
IMPRESSION: Negative examination

## 2023-07-04 ENCOUNTER — Other Ambulatory Visit (HOSPITAL_BASED_OUTPATIENT_CLINIC_OR_DEPARTMENT_OTHER): Payer: Self-pay

## 2023-07-14 ENCOUNTER — Other Ambulatory Visit: Payer: Self-pay | Admitting: Family Medicine

## 2023-07-14 ENCOUNTER — Other Ambulatory Visit (HOSPITAL_BASED_OUTPATIENT_CLINIC_OR_DEPARTMENT_OTHER): Payer: Self-pay

## 2023-07-14 DIAGNOSIS — F411 Generalized anxiety disorder: Secondary | ICD-10-CM

## 2023-07-14 MED ORDER — SERTRALINE HCL 50 MG PO TABS
50.0000 mg | ORAL_TABLET | Freq: Every day | ORAL | 1 refills | Status: DC
Start: 2023-07-14 — End: 2024-01-13
  Filled 2023-07-14: qty 90, 90d supply, fill #0
  Filled 2023-10-14: qty 90, 90d supply, fill #1

## 2023-07-18 ENCOUNTER — Other Ambulatory Visit (HOSPITAL_BASED_OUTPATIENT_CLINIC_OR_DEPARTMENT_OTHER): Payer: Self-pay

## 2023-07-18 ENCOUNTER — Other Ambulatory Visit: Payer: Self-pay | Admitting: Physician Assistant

## 2023-07-18 MED ORDER — AMPHETAMINE-DEXTROAMPHETAMINE 20 MG PO TABS
20.0000 mg | ORAL_TABLET | Freq: Every day | ORAL | 0 refills | Status: DC
  Filled 2023-07-18: qty 30, 30d supply, fill #0

## 2023-07-18 MED ORDER — AMPHETAMINE-DEXTROAMPHETAMINE 10 MG PO TABS
10.0000 mg | ORAL_TABLET | Freq: Every day | ORAL | 0 refills | Status: DC
  Filled 2023-07-18: qty 30, 30d supply, fill #0

## 2023-08-17 ENCOUNTER — Other Ambulatory Visit (HOSPITAL_BASED_OUTPATIENT_CLINIC_OR_DEPARTMENT_OTHER): Payer: Self-pay

## 2023-08-17 ENCOUNTER — Other Ambulatory Visit: Payer: Self-pay | Admitting: Physician Assistant

## 2023-08-17 MED ORDER — AMPHETAMINE-DEXTROAMPHETAMINE 10 MG PO TABS
10.0000 mg | ORAL_TABLET | Freq: Every day | ORAL | 0 refills | Status: DC
  Filled 2023-08-17: qty 30, 30d supply, fill #0

## 2023-08-17 MED ORDER — AMPHETAMINE-DEXTROAMPHETAMINE 20 MG PO TABS
20.0000 mg | ORAL_TABLET | Freq: Every day | ORAL | 0 refills | Status: DC
Start: 2023-08-17 — End: 2023-09-13
  Filled 2023-08-17: qty 30, 30d supply, fill #0

## 2023-08-26 ENCOUNTER — Other Ambulatory Visit (HOSPITAL_BASED_OUTPATIENT_CLINIC_OR_DEPARTMENT_OTHER): Payer: Self-pay

## 2023-09-06 ENCOUNTER — Other Ambulatory Visit (HOSPITAL_BASED_OUTPATIENT_CLINIC_OR_DEPARTMENT_OTHER): Payer: Self-pay

## 2023-09-07 ENCOUNTER — Telehealth: Payer: Self-pay | Admitting: Physician Assistant

## 2023-09-07 NOTE — Telephone Encounter (Signed)
 Called to try to reschedule appointment from June 4th with Dwain Giovanni. Dwain Giovanni will be out of the office. Left a voicemail message for patient to call the office to get the appointment rescheduled.

## 2023-09-08 ENCOUNTER — Other Ambulatory Visit (HOSPITAL_BASED_OUTPATIENT_CLINIC_OR_DEPARTMENT_OTHER): Payer: Self-pay

## 2023-09-13 ENCOUNTER — Other Ambulatory Visit: Payer: Self-pay | Admitting: Physician Assistant

## 2023-09-13 MED ORDER — AMPHETAMINE-DEXTROAMPHETAMINE 20 MG PO TABS
20.0000 mg | ORAL_TABLET | Freq: Every day | ORAL | 0 refills | Status: DC
  Filled 2023-09-13: qty 30, 30d supply, fill #0

## 2023-09-13 MED ORDER — AMPHETAMINE-DEXTROAMPHETAMINE 10 MG PO TABS
10.0000 mg | ORAL_TABLET | Freq: Every day | ORAL | 0 refills | Status: DC
  Filled 2023-09-13: qty 30, 30d supply, fill #0

## 2023-09-14 ENCOUNTER — Other Ambulatory Visit (HOSPITAL_BASED_OUTPATIENT_CLINIC_OR_DEPARTMENT_OTHER): Payer: Self-pay

## 2023-09-21 ENCOUNTER — Ambulatory Visit: Payer: No Typology Code available for payment source | Admitting: Physician Assistant

## 2023-09-29 ENCOUNTER — Encounter: Payer: Self-pay | Admitting: Physician Assistant

## 2023-09-29 ENCOUNTER — Ambulatory Visit: Admitting: Physician Assistant

## 2023-09-29 VITALS — BP 126/84 | HR 93 | Temp 98.5°F | Resp 18 | Ht 62.25 in | Wt 178.2 lb

## 2023-09-29 DIAGNOSIS — E559 Vitamin D deficiency, unspecified: Secondary | ICD-10-CM | POA: Diagnosis not present

## 2023-09-29 DIAGNOSIS — E782 Mixed hyperlipidemia: Secondary | ICD-10-CM

## 2023-09-29 DIAGNOSIS — F339 Major depressive disorder, recurrent, unspecified: Secondary | ICD-10-CM

## 2023-09-29 DIAGNOSIS — F909 Attention-deficit hyperactivity disorder, unspecified type: Secondary | ICD-10-CM | POA: Diagnosis not present

## 2023-09-29 NOTE — Progress Notes (Signed)
 Established Patient Office Visit  Subjective:  Patient ID: Kaitlyn Sawyer, female    DOB: 1985-06-30  Age: 38 y.o. MRN: [id number]  CC:  Chief Complaint  Patient presents with   Medical Management of Chronic Issues    HPI [name] presents for follow up ADD - pt with history of ADD diagnosed as an adult.  Currently taking adderall 20mg  qam and adderall 10mg  qpm.  States medication working well for her with no breakthrough symptoms  Pt with mild depression. Currently taking zoloft  50mg  qd - says medication works well for symptoms - denies any problems  Pt with vit D def - takes weekly supplement - last labwork was stable  Pt with hyperlipidemia - currently not on med and watcing diet - will repeat labwork at physical follow up appt  Past Medical History:  Diagnosis Date   Heart murmur    asymptomatic with exception during pregancy notice occ. palpitations   History of abnormal cervical Pap smear    HPV-- S/P CRYOTHERPY   History of gestational hypertension    DUE TO ECLAMPSIA   History of postpartum depression    History of seizure    x1  secondary to preclampsia 2011 (approx)  none since    Past Surgical History:  Procedure Laterality Date   DILATION AND CURETTAGE OF UTERUS     post partem retained placenta   LAPAROSCOPIC TUBAL LIGATION Bilateral 01/23/2015   Procedure: LAPAROSCOPIC TUBAL LIGATION;  Surgeon: Thurman Flores, MD;  Location: [facility];  Service: Gynecology;  Laterality: Bilateral;   uterine ablasion  01/06/2017   WISDOM TOOTH EXTRACTION  age 87    Family History  Problem Relation Age of Onset   Alcohol abuse Maternal Grandfather        liver failure   Heart disease Paternal Grandfather        massive MI   Hyperlipidemia Paternal Grandfather    Heart attack Paternal Grandfather    Stroke Paternal Grandmother    Hyperlipidemia Paternal Grandmother    Alcohol abuse Mother     Social History   Socioeconomic History    Marital status: Divorced    Spouse name: Not on file   Number of children: 4   Years of education: Not on file   Highest education level: Associate degree: academic program  Occupational History   Occupation: Sales executive  Tobacco Use   Smoking status: Former    Current packs/day: 0.00    Average packs/day: 0.5 packs/day for 10.0 years (5.0 ttl pk-yrs)    Types: Cigarettes    Start date: 04/20/2007    Quit date: 04/19/2017    Years since quitting: 6.4   Smokeless tobacco: Never  Vaping Use   Vaping status: Never Used  Substance and Sexual Activity   Alcohol use: Yes    Comment: social   Drug use: No   Sexual activity: Yes  Other Topics Concern   Not on file  Social History Narrative   Not on file   Social Drivers of Health   Financial Resource Strain: Medium Risk (09/26/2023)   Overall Financial Resource Strain (CARDIA)    Difficulty of Paying Living Expenses: Somewhat hard  Food Insecurity: Food Insecurity Present (09/26/2023)   Hunger Vital Sign    Worried About Running Out of Food in the Last Year: Sometimes true    Ran Out of Food in the Last Year: Never true  Transportation Needs: No Transportation Needs (09/26/2023)   PRAPARE - Transportation  Lack of Transportation (Medical): No    Lack of Transportation (Non-Medical): No  Physical Activity: Sufficiently Active (09/26/2023)   Exercise Vital Sign    Days of Exercise per Week: 3 days    Minutes of Exercise per Session: 60 min  Stress: No Stress Concern Present (09/26/2023)   [employer] of Occupational Health - Occupational Stress Questionnaire    Feeling of Stress : Not at all  Social Connections: Moderately Isolated (09/26/2023)   Social Connection and Isolation Panel    Frequency of Communication with Friends and Family: More than three times a week    Frequency of Social Gatherings with Friends and Family: Twice a week    Attends Religious Services: 1 to 4 times per year    Active Member of [employer] or  Organizations: No    Attends Engineer, structural: Not on file    Marital Status: Divorced  Intimate Partner Violence: Not At Risk (02/09/2022)   Humiliation, Afraid, Rape, and Kick questionnaire    Fear of Current or Ex-Partner: No    Emotionally Abused: No    Physically Abused: No    Sexually Abused: No     Current Outpatient Medications:    amphetamine -dextroamphetamine  (ADDERALL) 10 MG tablet, Take 1 tablet (10 mg total) by mouth daily in the afternoon as needed., Disp: 30 tablet, Rfl: 0   amphetamine -dextroamphetamine  (ADDERALL) 20 MG tablet, Take 1 tablet (20 mg total) by mouth daily., Disp: 30 tablet, Rfl: 0   sertraline  (ZOLOFT ) 50 MG tablet, Take 1 tablet (50 mg total) by mouth daily., Disp: 90 tablet, Rfl: 1   TIRZEPATIDE-WEIGHT MANAGEMENT [place], , Disp: , Rfl:    valACYclovir  (VALTREX ) 1000 MG tablet, Take 1 tablet (1,000 mg total) by mouth 2 (two) times daily on an as needed basis., Disp: 20 tablet, Rfl: 2   Vitamin D , Ergocalciferol , (DRISDOL ) 1.25 MG (50000 UNIT) CAPS capsule, Take 1 capsule by mouth every 7 days., Disp: 5 capsule, Rfl: 3   Allergies  Allergen Reactions   Trintellix  [Vortioxetine ] Nausea Only   Diclegis [Doxylamine-Pyridoxine] Hives, Itching and Rash   Penicillins Swelling and Rash    Has patient had a PCN reaction causing immediate rash, facial/tongue/throat swelling, SOB or lightheadedness with hypotension: no Has patient had a PCN reaction causing severe rash involving mucus membranes or skin necrosis: unknown Has patient had a PCN reaction that required hospitalization:no Has patient had a PCN reaction occurring within the last 10 years:no If all of the above answers are NO, then may proceed with Cephalosporin use.    CONSTITUTIONAL: Negative for chills, fatigue, fever,  E/N/T: Negative for ear pain, nasal congestion and sore throat.  CARDIOVASCULAR: Negative for chest pain, dizziness, palpitations and pedal edema.  RESPIRATORY: Negative  for recent cough and dyspnea.  GASTROINTESTINAL: Negative for abdominal pain, acid reflux symptoms, constipation, diarrhea, nausea and vomiting.   PSYCHIATRIC: Negative for sleep disturbance and to question depression screen.  Negative for depression, negative for anhedonia.         Objective:  PHYSICAL EXAM:   VS: BP 126/84   Pulse 93   Temp 98.5 F (36.9 C) (Temporal)   Resp 18   Ht 5' 2.25 (1.581 m)   Wt 178 lb 3.2 oz (80.8 kg)   LMP  (LMP Unknown)   SpO2 97%   BMI 32.33 kg/m   GEN: Well nourished, well developed, in no acute distress   Cardiac: RRR; no murmurs, rubs, Respiratory:  normal respiratory rate and pattern with no distress -  normal breath sounds with no rales, rhonchi, wheezes or rubs  Neuro:  Alert and Oriented x 3,  CN II-Xii grossly intact Psych: euthymic mood, appropriate affect and demeanor    Health Maintenance Due  Topic Date Due   HPV VACCINES (1 - 3-dose series) Never done       Topic Date Due   HPV VACCINES (1 - 3-dose series) Never done    Lab Results  Component Value Date   TSH 1.230 03/23/2023   Lab Results  Component Value Date   WBC 6.1 03/23/2023   HGB 13.0 03/23/2023   HCT 40.0 03/23/2023   MCV 87 03/23/2023   PLT 309 03/23/2023   Lab Results  Component Value Date   NA 140 03/23/2023   K 5.0 03/23/2023   CO2 24 03/23/2023   GLUCOSE 83 03/23/2023   BUN 11 03/23/2023   CREATININE 0.76 03/23/2023   BILITOT 0.5 03/23/2023   ALKPHOS 47 03/23/2023   AST 21 03/23/2023   ALT 11 03/23/2023   PROT 7.0 03/23/2023   ALBUMIN 4.6 03/23/2023   CALCIUM 9.5 03/23/2023   ANIONGAP 7 03/19/2016   EGFR 103 03/23/2023   Lab Results  Component Value Date   CHOL 166 03/23/2023   Lab Results  Component Value Date   HDL 39 (L) 03/23/2023   Lab Results  Component Value Date   LDLCALC 114 (H) 03/23/2023   Lab Results  Component Value Date   TRIG 64 03/23/2023   Lab Results  Component Value Date   CHOLHDL 4.3 03/23/2023    No results found for: HGBA1C    Assessment & Plan:   Problem List Items Addressed This Visit       Other Vit D def Continue supplement  Hyperlipidemia Continue to watch diet    Adult ADHD - Primary Continue current medication    Depression, recurrent (HCC) Continue current medication    No orders of the defined types were placed in this encounter.   Follow-up: Return in about 6 months (around 03/30/2024) for fasting physical.    SARA R Ajani Schnieders, PA-C

## 2023-10-14 ENCOUNTER — Other Ambulatory Visit: Payer: Self-pay | Admitting: Family Medicine

## 2023-10-14 ENCOUNTER — Other Ambulatory Visit (HOSPITAL_BASED_OUTPATIENT_CLINIC_OR_DEPARTMENT_OTHER): Payer: Self-pay

## 2023-10-18 ENCOUNTER — Other Ambulatory Visit (HOSPITAL_BASED_OUTPATIENT_CLINIC_OR_DEPARTMENT_OTHER): Payer: Self-pay

## 2023-10-18 MED ORDER — AMPHETAMINE-DEXTROAMPHETAMINE 20 MG PO TABS
20.0000 mg | ORAL_TABLET | Freq: Every day | ORAL | 0 refills | Status: DC
  Filled 2023-10-18: qty 30, 30d supply, fill #0

## 2023-10-18 MED ORDER — AMPHETAMINE-DEXTROAMPHETAMINE 10 MG PO TABS
10.0000 mg | ORAL_TABLET | Freq: Every day | ORAL | 0 refills | Status: DC
  Filled 2023-10-18: qty 30, 30d supply, fill #0

## 2023-10-27 ENCOUNTER — Other Ambulatory Visit (HOSPITAL_BASED_OUTPATIENT_CLINIC_OR_DEPARTMENT_OTHER): Payer: Self-pay

## 2023-10-27 MED ORDER — DOXYCYCLINE MONOHYDRATE 100 MG PO CAPS
100.0000 mg | ORAL_CAPSULE | Freq: Two times a day (BID) | ORAL | 0 refills | Status: DC
  Filled 2023-10-27: qty 14, 7d supply, fill #0

## 2023-11-16 ENCOUNTER — Other Ambulatory Visit (HOSPITAL_BASED_OUTPATIENT_CLINIC_OR_DEPARTMENT_OTHER): Payer: Self-pay

## 2023-11-16 ENCOUNTER — Other Ambulatory Visit: Payer: Self-pay | Admitting: Family Medicine

## 2023-11-16 MED ORDER — AMPHETAMINE-DEXTROAMPHETAMINE 20 MG PO TABS
20.0000 mg | ORAL_TABLET | Freq: Every day | ORAL | 0 refills | Status: DC
  Filled 2023-11-16: qty 30, 30d supply, fill #0

## 2023-11-16 MED ORDER — AMPHETAMINE-DEXTROAMPHETAMINE 10 MG PO TABS
10.0000 mg | ORAL_TABLET | Freq: Every day | ORAL | 0 refills | Status: DC
  Filled 2023-11-16: qty 30, 30d supply, fill #0

## 2023-11-18 ENCOUNTER — Other Ambulatory Visit (HOSPITAL_BASED_OUTPATIENT_CLINIC_OR_DEPARTMENT_OTHER): Payer: Self-pay

## 2023-11-18 ENCOUNTER — Other Ambulatory Visit: Payer: Self-pay | Admitting: Physician Assistant

## 2023-11-18 DIAGNOSIS — E559 Vitamin D deficiency, unspecified: Secondary | ICD-10-CM

## 2023-11-18 MED ORDER — VITAMIN D (ERGOCALCIFEROL) 1.25 MG (50000 UNIT) PO CAPS
50000.0000 [IU] | ORAL_CAPSULE | ORAL | 3 refills | Status: DC
  Filled 2023-11-18: qty 5, 35d supply, fill #0
  Filled 2024-01-05: qty 5, 35d supply, fill #1
  Filled 2024-02-13: qty 5, 35d supply, fill #2
  Filled 2024-03-21: qty 5, 35d supply, fill #3

## 2023-12-15 ENCOUNTER — Other Ambulatory Visit: Payer: Self-pay | Admitting: Physician Assistant

## 2023-12-16 ENCOUNTER — Other Ambulatory Visit (HOSPITAL_BASED_OUTPATIENT_CLINIC_OR_DEPARTMENT_OTHER): Payer: Self-pay

## 2023-12-16 MED ORDER — AMPHETAMINE-DEXTROAMPHETAMINE 20 MG PO TABS
20.0000 mg | ORAL_TABLET | Freq: Every day | ORAL | 0 refills | Status: DC
  Filled 2023-12-16: qty 30, 30d supply, fill #0

## 2023-12-16 MED ORDER — AMPHETAMINE-DEXTROAMPHETAMINE 10 MG PO TABS
10.0000 mg | ORAL_TABLET | Freq: Every day | ORAL | 0 refills | Status: DC
  Filled 2023-12-16: qty 30, 30d supply, fill #0

## 2023-12-30 ENCOUNTER — Other Ambulatory Visit (HOSPITAL_BASED_OUTPATIENT_CLINIC_OR_DEPARTMENT_OTHER): Payer: Self-pay

## 2023-12-30 MED ORDER — HYDROCORTISONE 1 % EX OINT
1.0000 | TOPICAL_OINTMENT | Freq: Two times a day (BID) | CUTANEOUS | 1 refills | Status: AC
  Filled 2023-12-30: qty 453.6, 90d supply, fill #0

## 2024-01-02 ENCOUNTER — Other Ambulatory Visit (HOSPITAL_BASED_OUTPATIENT_CLINIC_OR_DEPARTMENT_OTHER): Payer: Self-pay

## 2024-01-05 ENCOUNTER — Other Ambulatory Visit (HOSPITAL_BASED_OUTPATIENT_CLINIC_OR_DEPARTMENT_OTHER): Payer: Self-pay

## 2024-01-13 ENCOUNTER — Other Ambulatory Visit: Payer: Self-pay | Admitting: Physician Assistant

## 2024-01-13 DIAGNOSIS — F411 Generalized anxiety disorder: Secondary | ICD-10-CM

## 2024-01-15 MED ORDER — SERTRALINE HCL 50 MG PO TABS
50.0000 mg | ORAL_TABLET | Freq: Every day | ORAL | 1 refills | Status: AC
  Filled 2024-01-15: qty 90, 90d supply, fill #0
  Filled 2024-04-17: qty 90, 90d supply, fill #1

## 2024-01-16 ENCOUNTER — Other Ambulatory Visit (HOSPITAL_BASED_OUTPATIENT_CLINIC_OR_DEPARTMENT_OTHER): Payer: Self-pay

## 2024-01-18 ENCOUNTER — Other Ambulatory Visit: Payer: Self-pay | Admitting: Physician Assistant

## 2024-01-18 ENCOUNTER — Other Ambulatory Visit (HOSPITAL_BASED_OUTPATIENT_CLINIC_OR_DEPARTMENT_OTHER): Payer: Self-pay

## 2024-01-18 MED ORDER — AMPHETAMINE-DEXTROAMPHETAMINE 20 MG PO TABS
20.0000 mg | ORAL_TABLET | Freq: Every day | ORAL | 0 refills | Status: DC
  Filled 2024-01-18 – 2024-01-19 (×2): qty 30, 30d supply, fill #0

## 2024-01-18 MED ORDER — AMPHETAMINE-DEXTROAMPHETAMINE 10 MG PO TABS
10.0000 mg | ORAL_TABLET | Freq: Every day | ORAL | 0 refills | Status: DC
  Filled 2024-01-18 – 2024-01-19 (×2): qty 30, 30d supply, fill #0

## 2024-01-19 ENCOUNTER — Other Ambulatory Visit (HOSPITAL_BASED_OUTPATIENT_CLINIC_OR_DEPARTMENT_OTHER): Payer: Self-pay

## 2024-01-19 MED ORDER — FLUZONE 0.5 ML IM SUSY
0.5000 mL | PREFILLED_SYRINGE | Freq: Once | INTRAMUSCULAR | 0 refills | Status: AC
  Filled 2024-01-19: qty 0.5, 1d supply, fill #0

## 2024-02-03 ENCOUNTER — Telehealth: Payer: Self-pay | Admitting: Physician Assistant

## 2024-02-03 NOTE — Telephone Encounter (Signed)
 Called patient in regard to the appointment coming up on 04/02/24. Called to reschedule the appointment due to the provider being out of the office. Offered for the patient to come in the next day at 3:20 and blocked the schedule for her. Requested for the patient to call the office back.

## 2024-02-13 ENCOUNTER — Other Ambulatory Visit (HOSPITAL_BASED_OUTPATIENT_CLINIC_OR_DEPARTMENT_OTHER): Payer: Self-pay

## 2024-02-17 ENCOUNTER — Other Ambulatory Visit: Payer: Self-pay | Admitting: Family Medicine

## 2024-02-17 ENCOUNTER — Other Ambulatory Visit (HOSPITAL_BASED_OUTPATIENT_CLINIC_OR_DEPARTMENT_OTHER): Payer: Self-pay

## 2024-02-17 MED ORDER — AMPHETAMINE-DEXTROAMPHETAMINE 20 MG PO TABS
20.0000 mg | ORAL_TABLET | Freq: Every day | ORAL | 0 refills | Status: DC
  Filled 2024-02-17: qty 30, 30d supply, fill #0

## 2024-02-17 MED ORDER — AMPHETAMINE-DEXTROAMPHETAMINE 10 MG PO TABS
10.0000 mg | ORAL_TABLET | Freq: Every day | ORAL | 0 refills | Status: DC
  Filled 2024-02-17: qty 30, 30d supply, fill #0

## 2024-03-06 ENCOUNTER — Other Ambulatory Visit (HOSPITAL_BASED_OUTPATIENT_CLINIC_OR_DEPARTMENT_OTHER): Payer: Self-pay

## 2024-03-06 ENCOUNTER — Encounter: Payer: Self-pay | Admitting: Family Medicine

## 2024-03-06 ENCOUNTER — Ambulatory Visit: Admitting: Family Medicine

## 2024-03-06 VITALS — BP 122/68 | HR 89 | Temp 98.0°F | Resp 18 | Ht 62.25 in | Wt 156.4 lb

## 2024-03-06 DIAGNOSIS — J029 Acute pharyngitis, unspecified: Secondary | ICD-10-CM | POA: Insufficient documentation

## 2024-03-06 DIAGNOSIS — R6889 Other general symptoms and signs: Secondary | ICD-10-CM | POA: Insufficient documentation

## 2024-03-06 LAB — POC INFLUENZA A&B (BINAX/QUICKVUE)
Influenza A, POC: NEGATIVE
Influenza B, POC: NEGATIVE

## 2024-03-06 LAB — POCT RAPID STREP A (OFFICE): Rapid Strep A Screen: NEGATIVE

## 2024-03-06 LAB — POC COVID19 BINAXNOW: SARS Coronavirus 2 Ag: NEGATIVE

## 2024-03-06 MED ORDER — AZITHROMYCIN 250 MG PO TABS
ORAL_TABLET | ORAL | 0 refills | Status: AC
  Filled 2024-03-06: qty 6, 5d supply, fill #0

## 2024-03-06 NOTE — Patient Instructions (Signed)
  VISIT SUMMARY: Today, you were seen for a sore throat and loss of voice that started on Saturday and worsened by Tuesday. You also experienced chest pain with coughing, headaches, sinus pressure, and a general feeling of being run down. You have a history of allergies and were recently in contact with your daughter who had a cold and cough.  YOUR PLAN: -ACUTE PHARYNGITIS: Acute pharyngitis is an inflammation of the throat, often caused by an infection. Your symptoms and clinical presentation suggest a streptococcal infection, even though your rapid strep test was negative. You have been prescribed azithromycin  (Z-Pak) to help treat the infection. Please pick up your prescription from the pharmacy in [place].  -GENERAL SYMPTOMS (FATIGUE, HEADACHE, SINUS PRESSURE, CHEST PAIN): Your fatigue, headache, sinus pressure, and chest pain are likely related to your current illness. These symptoms should improve as your infection clears up. Continue to rest, stay hydrated, and take over-the-counter medications as needed to manage these symptoms.  INSTRUCTIONS: Please follow up if your symptoms do not improve after completing the antibiotic course or if they worsen. If you experience any new or concerning symptoms, contact our office immediately.

## 2024-03-06 NOTE — Assessment & Plan Note (Addendum)
 Acute pharyngitis Negative rapid strep test, but clinical presentation suggests streptococcal infection with sore throat, aphonia, and white patches. - Prescribed azithromycin  (Z-Pak). - Sent prescription to pharmacy in [place]. Orders:   Rapid Strep A   azithromycin  (ZITHROMAX ) 250 MG tablet; Take 2 tablets on day 1, then 1 tablet daily on days 2 through 5

## 2024-03-06 NOTE — Assessment & Plan Note (Addendum)
 Flu and Covid - negative General symptoms (fatigue, headache, sinus pressure, chest pain) Persistent fatigue, headache, sinus pressure, and chest pain associated with coughing.  Orders:   POC COVID-19   POC Influenza A&B (Binax test)

## 2024-03-06 NOTE — Progress Notes (Signed)
 Subjective:  Patient ID: Kaitlyn Sawyer, female    DOB: 29-Aug-1985  Age: 38 y.o. MRN: [id number]  Chief Complaint  Patient presents with   Cough   Nasal Congestion    Discussed the use of AI scribe software for clinical note transcription with the patient, who gave verbal consent to proceed.  History of Present Illness   Kaitlyn Sawyer Kaitlyn Sawyer is a 38 year old female who presents with sore throat and loss of voice.  Pharyngitis and dysphonia - Sore throat onset Saturday, progressively worsening with most prominent pain on Tuesday - Loss of voice noted Tuesday morning - No significant ear pain - Initial improvement with Mucinex and ibuprofen , but symptoms have persisted  Cough and chest pain - Chest pain present with coughing on Tuesday morning, now decreased - Cough produces small amounts of yellow phlegm - No significant drainage until Tuesday morning  Headache and sinus pressure - Headaches experienced over the weekend - Sinus pressure more pronounced currently  Constitutional symptoms - General feeling of being run down since Saturday - No fever, night sweats, or chills  Exposure history - Recent contact with youngest daughter who had cold and cough two weeks ago  - 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         09/29/2023    3:30 PM 03/23/2023   11:05 AM 02/11/2023    9:54 AM 08/16/2022    3:30 PM 05/13/2022    3:22 PM  Depression screen PHQ 2/9  Decreased Interest 0 0 0 0 0  Down, Depressed, Hopeless 0 0 0 0 0  PHQ - 2 Score 0 0 0 0 0  Altered sleeping 0 0 1 0 0  Tired, decreased energy 0 1 1 0 1  Change in appetite 0 0 0 0 0  Feeling bad or failure about yourself  0 0 0 0 0  Trouble concentrating 0 0 0 0 0  Moving slowly or fidgety/restless 0 0 0 0 0  Suicidal thoughts 0 0 0 0 0  PHQ-9 Score 0  1  2  0  1   Difficult doing work/chores Not difficult at all Somewhat difficult Not difficult at all Not difficult at all Not difficult at all     Data  saved with a previous flowsheet row definition        09/29/2023    3:29 PM  Fall Risk   Falls in the past year? 0  Number falls in past yr: 0  Injury with Fall? 0  Risk for fall due to : No Fall Risks  Follow up Falls evaluation completed    Patient Care Team: Nicholaus Credit, PA-C as PCP - General (Physician Assistant)   Review of Systems  Constitutional:  Positive for fatigue. Negative for chills, diaphoresis and fever.  HENT:  Positive for congestion, postnasal drip, sinus pressure, sore throat and voice change. Negative for ear pain and sinus pain.   Eyes: Negative.   Respiratory:  Positive for cough. Negative for shortness of breath.   Cardiovascular:  Negative for chest pain.  Gastrointestinal:  Negative for abdominal pain, constipation, nausea and vomiting.  Endocrine: Negative.   Genitourinary:  Negative for dysuria.  Musculoskeletal:  Negative for arthralgias.  Allergic/Immunologic: Negative.   Neurological:  Positive for headaches. Negative for dizziness, weakness and light-headedness.  Psychiatric/Behavioral:  Negative for dysphoric mood. The patient is not nervous/anxious.     Current Outpatient Medications on File Prior to Visit  Medication Sig Dispense Refill  amphetamine -dextroamphetamine  (ADDERALL) 10 MG tablet Take 1 tablet (10 mg total) by mouth daily in the afternoon as needed. 30 tablet 0   amphetamine -dextroamphetamine  (ADDERALL) 20 MG tablet Take 1 tablet (20 mg total) by mouth daily. 30 tablet 0   sertraline  (ZOLOFT ) 50 MG tablet Take 1 tablet (50 mg total) by mouth daily. 90 tablet 1   TIRZEPATIDE-WEIGHT MANAGEMENT [place]      valACYclovir  (VALTREX ) 1000 MG tablet Take 1 tablet (1,000 mg total) by mouth 2 (two) times daily on an as needed basis. 20 tablet 2   Vitamin D , Ergocalciferol , (DRISDOL ) 1.25 MG (50000 UNIT) CAPS capsule Take 1 capsule by mouth every 7 days. 5 capsule 3   hydrocortisone  1 % ointment Apply 1 Application topically in the morning and  evening. (Patient not taking: Reported on 03/06/2024) 453.6 g 1   No current facility-administered medications on file prior to visit.   Past Medical History:  Diagnosis Date   Heart murmur    asymptomatic with exception during pregancy notice occ. palpitations   History of abnormal cervical Pap smear    HPV-- S/P CRYOTHERPY   History of gestational hypertension    DUE TO ECLAMPSIA   History of postpartum depression    History of seizure    x1  secondary to preclampsia 2011 (approx)  none since   Past Surgical History:  Procedure Laterality Date   DILATION AND CURETTAGE OF UTERUS     post partem retained placenta   LAPAROSCOPIC TUBAL LIGATION Bilateral 01/23/2015   Procedure: LAPAROSCOPIC TUBAL LIGATION;  Surgeon: Rosaline Cobble, MD;  Location: [facility];  Service: Gynecology;  Laterality: Bilateral;   uterine ablasion  01/06/2017   WISDOM TOOTH EXTRACTION  age 23    Family History  Problem Relation Age of Onset   Alcohol abuse Maternal Grandfather        liver failure   Heart disease Paternal Grandfather        massive MI   Hyperlipidemia Paternal Grandfather    Heart attack Paternal Grandfather    Stroke Paternal Grandmother    Hyperlipidemia Paternal Grandmother    Alcohol abuse Mother    Social History   Socioeconomic History   Marital status: Divorced    Spouse name: Not on file   Number of children: 4   Years of education: Not on file   Highest education level: Associate degree: academic program  Occupational History   Occupation: Sales Executive  Tobacco Use   Smoking status: Former    Current packs/day: 0.00    Average packs/day: 0.5 packs/day for 10.0 years (5.0 ttl pk-yrs)    Types: Cigarettes    Start date: 04/20/2007    Quit date: 04/19/2017    Years since quitting: 6.8   Smokeless tobacco: Never  Vaping Use   Vaping status: Never Used  Substance and Sexual Activity   Alcohol use: Yes    Comment: social   Drug use: No   Sexual  activity: Yes  Other Topics Concern   Not on file  Social History Narrative   Not on file   Social Drivers of Health   Financial Resource Strain: Medium Risk (09/26/2023)   Overall Financial Resource Strain (CARDIA)    Difficulty of Paying Living Expenses: Somewhat hard  Food Insecurity: Food Insecurity Present (09/26/2023)   Hunger Vital Sign    Worried About Running Out of Food in the Last Year: Sometimes true    Ran Out of Food in the Last Year: Never true  Transportation Needs: No Transportation Needs (09/26/2023)   PRAPARE - Administrator, Civil Service (Medical): No    Lack of Transportation (Non-Medical): No  Physical Activity: Sufficiently Active (09/26/2023)   Exercise Vital Sign    Days of Exercise per Week: 3 days    Minutes of Exercise per Session: 60 min  Stress: No Stress Concern Present (09/26/2023)   [employer] of Occupational Health - Occupational Stress Questionnaire    Feeling of Stress : Not at all  Social Connections: Moderately Isolated (09/26/2023)   Social Connection and Isolation Panel    Frequency of Communication with Friends and Family: More than three times a week    Frequency of Social Gatherings with Friends and Family: Twice a week    Attends Religious Services: 1 to 4 times per year    Active Member of [employer] or Organizations: No    Attends Engineer, Structural: Not on file    Marital Status: Divorced    Objective:  BP 122/68   Pulse 89   Temp 98 F (36.7 C) (Temporal)   Resp 18   Ht 5' 2.25 (1.581 m)   Wt 156 lb 6.4 oz (70.9 kg)   SpO2 98%   BMI 28.38 kg/m      03/06/2024    2:34 PM 09/29/2023    3:26 PM 03/23/2023   11:04 AM  BP/Weight  Systolic BP 122 126 112  Diastolic BP 68 84 62  Wt. (Lbs) 156.4 178.2 149.4  BMI 28.38 kg/m2 32.33 kg/m2 27.11 kg/m2    Physical Exam Vitals reviewed.  Constitutional:      General: She is not in acute distress.    Appearance: Normal appearance.  HENT:     Right Ear: A  middle ear effusion is present. Tympanic membrane is erythematous.     Left Ear:  No middle ear effusion. Tympanic membrane is not erythematous.     Nose: Congestion present.     Right Sinus: Frontal sinus tenderness present. No maxillary sinus tenderness.     Left Sinus: Frontal sinus tenderness present. No maxillary sinus tenderness.     Mouth/Throat:     Pharynx: Pharyngeal swelling and posterior oropharyngeal erythema present.     Tonsils: Tonsillar exudate present. 2+ on the right. 2+ on the left.      Comments: White patches located on bilateral tonsils Cardiovascular:     Rate and Rhythm: Normal rate and regular rhythm.     Heart sounds: Normal heart sounds. No murmur heard. Pulmonary:     Effort: Pulmonary effort is normal. No respiratory distress.     Breath sounds: Normal breath sounds. No wheezing.  Abdominal:     General: Bowel sounds are normal.     Palpations: Abdomen is soft.     Tenderness: There is no abdominal tenderness.  Neurological:     Mental Status: She is alert. Mental status is at baseline.  Psychiatric:        Mood and Affect: Mood normal.        Behavior: Behavior normal.     Lab Results  Component Value Date   WBC 6.1 03/23/2023   HGB 13.0 03/23/2023   HCT 40.0 03/23/2023   PLT 309 03/23/2023   GLUCOSE 83 03/23/2023   CHOL 166 03/23/2023   TRIG 64 03/23/2023   HDL 39 (L) 03/23/2023   LDLCALC 114 (H) 03/23/2023   ALT 11 03/23/2023   AST 21 03/23/2023   NA 140 03/23/2023  K 5.0 03/23/2023   CL 102 03/23/2023   CREATININE 0.76 03/23/2023   BUN 11 03/23/2023   CO2 24 03/23/2023   TSH 1.230 03/23/2023    Results for orders placed or performed in visit on 03/06/24  Rapid Strep A   Collection Time: 03/06/24  2:51 PM  Result Value Ref Range   Rapid Strep A Screen Negative Negative  .  Assessment & Plan:   Assessment & Plan Pharyngitis, unspecified etiology Acute pharyngitis Negative rapid strep test, but clinical presentation  suggests streptococcal infection with sore throat, aphonia, and white patches. - Prescribed azithromycin  (Z-Pak). - Sent prescription to pharmacy in [place]. Orders:   Rapid Strep A   azithromycin  (ZITHROMAX ) 250 MG tablet; Take 2 tablets on day 1, then 1 tablet daily on days 2 through 5  Flu-like symptoms Flu and Covid - negative General symptoms (fatigue, headache, sinus pressure, chest pain) Persistent fatigue, headache, sinus pressure, and chest pain associated with coughing.  Orders:   POC COVID-19   POC Influenza A&B (Binax test)   Follow-up: Return if symptoms worsen or fail to improve.  An After Visit Summary was printed and given to the patient.  Harrie Cedar, FNP [facility] [phone]

## 2024-03-21 ENCOUNTER — Other Ambulatory Visit (HOSPITAL_BASED_OUTPATIENT_CLINIC_OR_DEPARTMENT_OTHER): Payer: Self-pay

## 2024-03-21 ENCOUNTER — Other Ambulatory Visit: Payer: Self-pay | Admitting: Physician Assistant

## 2024-03-21 MED ORDER — AMPHETAMINE-DEXTROAMPHETAMINE 20 MG PO TABS
20.0000 mg | ORAL_TABLET | Freq: Every day | ORAL | 0 refills | Status: DC
  Filled 2024-03-21: qty 30, 30d supply, fill #0

## 2024-03-21 MED ORDER — AMPHETAMINE-DEXTROAMPHETAMINE 10 MG PO TABS
10.0000 mg | ORAL_TABLET | Freq: Every day | ORAL | 0 refills | Status: DC
  Filled 2024-03-21: qty 30, 30d supply, fill #0

## 2024-03-28 ENCOUNTER — Other Ambulatory Visit (HOSPITAL_BASED_OUTPATIENT_CLINIC_OR_DEPARTMENT_OTHER): Payer: Self-pay

## 2024-03-28 MED ORDER — CHLORHEXIDINE GLUCONATE 0.12 % MT SOLN
Freq: Two times a day (BID) | OROMUCOSAL | 5 refills | Status: DC
  Filled 2024-03-28: qty 473, 16d supply, fill #0

## 2024-03-28 MED ORDER — ONDANSETRON HCL 4 MG PO TABS
4.0000 mg | ORAL_TABLET | Freq: Three times a day (TID) | ORAL | 1 refills | Status: DC | PRN
  Filled 2024-03-28: qty 20, 7d supply, fill #0

## 2024-04-02 ENCOUNTER — Encounter: Admitting: Physician Assistant

## 2024-04-03 ENCOUNTER — Ambulatory Visit: Admitting: Physician Assistant

## 2024-04-03 VITALS — BP 128/78 | HR 85 | Temp 98.2°F | Resp 18 | Ht 62.25 in | Wt 160.4 lb

## 2024-04-03 DIAGNOSIS — Z0000 Encounter for general adult medical examination without abnormal findings: Secondary | ICD-10-CM

## 2024-04-03 LAB — POCT URINALYSIS DIP (CLINITEK)
Bilirubin, UA: NEGATIVE
Blood, UA: NEGATIVE
Glucose, UA: NEGATIVE mg/dL
Ketones, POC UA: NEGATIVE mg/dL
Leukocytes, UA: NEGATIVE
Nitrite, UA: NEGATIVE
Spec Grav, UA: 1.015 (ref 1.010–1.025)
Urobilinogen, UA: 0.2 U/dL
pH, UA: 6.5 (ref 5.0–8.0)

## 2024-04-03 NOTE — Progress Notes (Signed)
 Subjective:  Patient ID: Kaitlyn Sawyer, female    DOB: 08-26-1985  Age: 38 y.o. MRN: [id number]  Chief Complaint  Patient presents with   Annual Exam    HPI Well Adult Physical: Patient here for a comprehensive physical exam.The patient reports no problems Do you take any herbs or supplements that were not prescribed by a doctor? no Are you taking calcium supplements? no Are you taking aspirin daily? no  Encounter for general adult medical examination without abnormal findings  Physical (At Risk items are starred): Patient's last physical exam was 1 year ago .  Patient is not afflicted from Stress Incontinence and Urge Incontinence  Patient wears a seat belts Patient has smoke detectors and has carbon monoxide detectors. Patient practices appropriate gun safety. Patient wears sunscreen with extended sun exposure. Dental Care: brushes and flosses daily. Last dental visit: up to date Vision impairments:glasses for driving Ophthalmology/Optometry: Annual visit.  Hearing loss: none  No LMP recorded. Patient has had an ablation. But does cycle irregularly and light  Safe at home: Yes Self breast exams: Yes Last pap: up to date      04/03/2024    3:08 PM 09/29/2023    3:30 PM 03/23/2023   11:05 AM 02/11/2023    9:54 AM 08/16/2022    3:30 PM  Depression screen PHQ 2/9  Decreased Interest 0 0 0 0 0  Down, Depressed, Hopeless 0 0 0 0 0  PHQ - 2 Score 0 0 0 0 0  Altered sleeping 0 0 0 1 0  Tired, decreased energy 0 0 1 1 0  Change in appetite 0 0 0 0 0  Feeling bad or failure about yourself  0 0 0 0 0  Trouble concentrating 0 0 0 0 0  Moving slowly or fidgety/restless 0 0 0 0 0  Suicidal thoughts 0 0 0 0 0  PHQ-9 Score 0 0  1  2  0   Difficult doing work/chores Not difficult at all Not difficult at all Somewhat difficult Not difficult at all Not difficult at all     Data saved with a previous flowsheet row definition         08/16/2022    3:30 PM 02/11/2023     9:56 AM 03/23/2023   11:05 AM 09/29/2023    3:29 PM 04/03/2024    3:08 PM  Fall Risk  Falls in the past year? 0 0 0 0 0  Was there an injury with Fall? 0  0  0  0  0  Fall Risk Category Calculator 0 0 0 0 0  Patient at Risk for Falls Due to No Fall Risks No Fall Risks No Fall Risks No Fall Risks No Fall Risks  Fall risk Follow up Falls evaluation completed Falls evaluation completed Falls evaluation completed Falls evaluation completed Falls evaluation completed     Data saved with a previous flowsheet row definition             Social Hx   Social History   Socioeconomic History   Marital status: Divorced    Spouse name: Not on file   Number of children: 4   Years of education: Not on file   Highest education level: Associate degree: academic program  Occupational History   Occupation: Sales Executive  Tobacco Use   Smoking status: Former    Current packs/day: 0.00    Average packs/day: 0.5 packs/day for 10.0 years (5.0 ttl pk-yrs)    Types: Cigarettes  Start date: 04/20/2007    Quit date: 04/19/2017    Years since quitting: 6.9   Smokeless tobacco: Never  Vaping Use   Vaping status: Never Used  Substance and Sexual Activity   Alcohol use: Not Currently    Comment: social   Drug use: Never   Sexual activity: Yes    Birth control/protection: Surgical  Other Topics Concern   Not on file  Social History Narrative   Not on file   Social Drivers of Health   Tobacco Use: Medium Risk (04/03/2024)   Patient History    Smoking Tobacco Use: Former    Smokeless Tobacco Use: Never    Passive Exposure: Not on file  Financial Resource Strain: Medium Risk (04/03/2024)   Overall Financial Resource Strain (CARDIA)    Difficulty of Paying Living Expenses: Somewhat hard  Food Insecurity: Food Insecurity Present (04/03/2024)   Epic    Worried About Programme Researcher, Broadcasting/film/video in the Last Year: Sometimes true    Ran Out of Food in the Last Year: Never true  Transportation Needs: No  Transportation Needs (04/03/2024)   Epic    Lack of Transportation (Medical): No    Lack of Transportation (Non-Medical): No  Physical Activity: Sufficiently Active (04/03/2024)   Exercise Vital Sign    Days of Exercise per Week: 4 days    Minutes of Exercise per Session: 90 min  Stress: No Stress Concern Present (04/03/2024)   [employer] of Occupational Health - Occupational Stress Questionnaire    Feeling of Stress: Only a little  Social Connections: Moderately Isolated (04/03/2024)   Social Connection and Isolation Panel    Frequency of Communication with Friends and Family: More than three times a week    Frequency of Social Gatherings with Friends and Family: Twice a week    Attends Religious Services: More than 4 times per year    Active Member of Clubs or Organizations: No    Attends Engineer, Structural: Not on file    Marital Status: Divorced  Depression (PHQ2-9): Low Risk (04/03/2024)   Depression (PHQ2-9)    PHQ-2 Score: 0  Alcohol Screen: Low Risk (04/03/2024)   Alcohol Screen    Last Alcohol Screening Score (AUDIT): 2  Housing: High Risk (04/03/2024)   Epic    Unable to Pay for Housing in the Last Year: Yes    Number of Times Moved in the Last Year: 0    Homeless in the Last Year: No  Utilities: Not At Risk (04/03/2024)   Epic    Threatened with loss of utilities: No  Health Literacy: Adequate Health Literacy (04/03/2024)   B1300 Health Literacy    Frequency of need for help with medical instructions: Never   Past Medical History:  Diagnosis Date   Allergy    Anxiety    Heart murmur    asymptomatic with exception during pregancy notice occ. palpitations   History of abnormal cervical Pap smear    HPV-- S/P CRYOTHERPY   History of gestational hypertension    DUE TO ECLAMPSIA   History of postpartum depression    History of seizure    x1  secondary to preclampsia 2011 (approx)  none since   Past Surgical History:  Procedure Laterality  Date   DILATION AND CURETTAGE OF UTERUS     post partem retained placenta   LAPAROSCOPIC TUBAL LIGATION Bilateral 01/23/2015   Procedure: LAPAROSCOPIC TUBAL LIGATION;  Surgeon: Rosaline Cobble, MD;  Location: [facility];  Service:  Gynecology;  Laterality: Bilateral;   TUBAL LIGATION     uterine ablasion  01/06/2017   WISDOM TOOTH EXTRACTION  age 24    Family History  Problem Relation Age of Onset   Alcohol abuse Maternal Grandfather        liver failure   Heart disease Paternal Grandfather        massive MI   Hyperlipidemia Paternal Grandfather    Heart attack Paternal Grandfather    Stroke Paternal Grandmother    Hyperlipidemia Paternal Grandmother    Alcohol abuse Mother     ROS CONSTITUTIONAL: Negative for chills, fatigue, fever, unintentional weight gain and unintentional weight loss.  E/N/T: Negative for ear pain, nasal congestion and sore throat.  CARDIOVASCULAR: Negative for chest pain, dizziness, palpitations and pedal edema.  RESPIRATORY: Negative for recent cough and dyspnea.  GASTROINTESTINAL: Negative for abdominal pain, acid reflux symptoms, constipation, diarrhea, nausea and vomiting.  MSK: Negative for arthralgias and myalgias.  INTEGUMENTARY: Negative for rash.  NEUROLOGICAL: Negative for dizziness and headaches.  PSYCHIATRIC: Negative for sleep disturbance and to question depression screen.  Negative for depression, negative for anhedonia.   Objective:  PHYSICAL EXAM:   BP 128/78   Pulse 85   Temp 98.2 F (36.8 C) (Temporal)   Resp 18   Ht 5' 2.25 (1.581 m)   Wt 160 lb 6.4 oz (72.8 kg)   SpO2 97%   BMI 29.10 kg/m   Vision Screening   Right eye Left eye Both eyes  Without correction 20/25 20/25 20/20   With correction       GEN: Well nourished, well developed, in no acute distress  HEENT: normal external ears and nose - normal external auditory canals and TMS - hearing grossly normal - normal nasal mucosa and septum - Lips, Teeth  and Gums - normal  Oropharynx - normal mucosa, palate, and posterior pharynx Neck: no JVD or masses - no thyromegaly Cardiac: RRR; no murmurs, rubs, or gallops,no edema - no significant varicosities Respiratory:  normal respiratory rate and pattern with no distress - normal breath sounds with no rales, rhonchi, wheezes or rubs GI: normal bowel sounds, no masses or tenderness MS: no deformity or atrophy  Skin: warm and dry, no rash  Neuro:  Alert and Oriented x 3, Strength and sensation are intact - CN II-Xii grossly intact Psych: euthymic mood, appropriate affect and demeanor Office Visit on 04/03/2024  Component Date Value Ref Range Status   Color, UA 04/03/2024 yellow  yellow Final   Clarity, UA 04/03/2024 clear  clear Final   Glucose, UA 04/03/2024 negative  negative mg/dL Final   Bilirubin, UA 87/83/7974 negative  negative Final   Ketones, POC UA 04/03/2024 negative  negative mg/dL Final   Spec Grav, UA 87/83/7974 1.015  1.010 - 1.025 Final   Blood, UA 04/03/2024 negative  negative Final   pH, UA 04/03/2024 6.5  5.0 - 8.0 Final   POC PROTEIN,UA 04/03/2024 trace  negative, trace Final   Urobilinogen, UA 04/03/2024 0.2  0.2 or 1.0 E.U./dL Final   Nitrite, UA 87/83/7974 Negative  Negative Final   Leukocytes, UA 04/03/2024 Negative  Negative Final    Assessment & Plan:  Annual physical exam -     POCT URINALYSIS DIP (CLINITEK) -     CBC with Differential/Platelet; Future -     Comprehensive metabolic panel with GFR; Future -     TSH; Future -     Lipid panel; Future    This is a list  of the screening recommended for you and due dates:  Health Maintenance  Topic Date Due   HPV Vaccine (1 - Risk 3-dose SCDM series) 09/28/2024*   Pap with HPV screening  04/03/2025*   Hepatitis B Vaccine (1 of 3 - 19+ 3-dose series) 04/03/2025*   DTaP/Tdap/Td vaccine (2 - Td or Tdap) 03/22/2033   Flu Shot  Completed   HIV Screening  Completed   Pneumococcal Vaccine  Aged Out   Meningitis B  Vaccine  Aged Out   COVID-19 Vaccine  Discontinued   Hepatitis C Screening  Discontinued  *Topic was postponed. The date shown is not the original due date.     Follow-up: Return in about 6 months (around 10/02/2024) for chronic fasting follow-up.  An After Visit Summary was printed and given to the patient.  CAMIE JONELLE NICHOLAUS DEVONNA [facility] [phone]

## 2024-04-10 ENCOUNTER — Other Ambulatory Visit

## 2024-04-17 ENCOUNTER — Other Ambulatory Visit (HOSPITAL_BASED_OUTPATIENT_CLINIC_OR_DEPARTMENT_OTHER): Payer: Self-pay

## 2024-04-23 ENCOUNTER — Other Ambulatory Visit (HOSPITAL_BASED_OUTPATIENT_CLINIC_OR_DEPARTMENT_OTHER): Payer: Self-pay

## 2024-04-23 ENCOUNTER — Other Ambulatory Visit: Payer: Self-pay | Admitting: Physician Assistant

## 2024-04-23 DIAGNOSIS — E559 Vitamin D deficiency, unspecified: Secondary | ICD-10-CM

## 2024-04-23 MED ORDER — VITAMIN D (ERGOCALCIFEROL) 1.25 MG (50000 UNIT) PO CAPS
50000.0000 [IU] | ORAL_CAPSULE | ORAL | 3 refills | Status: AC
  Filled 2024-04-23: qty 5, 35d supply, fill #0

## 2024-04-23 MED ORDER — AMPHETAMINE-DEXTROAMPHETAMINE 10 MG PO TABS
10.0000 mg | ORAL_TABLET | Freq: Every day | ORAL | 0 refills | Status: AC
  Filled 2024-04-23: qty 30, 30d supply, fill #0
  Filled 2024-04-24: qty 10, 10d supply, fill #0
  Filled 2024-04-24: qty 20, 20d supply, fill #0
  Filled 2024-04-24: qty 30, 30d supply, fill #0

## 2024-04-23 MED ORDER — AMPHETAMINE-DEXTROAMPHETAMINE 20 MG PO TABS
20.0000 mg | ORAL_TABLET | Freq: Every day | ORAL | 0 refills | Status: AC
  Filled 2024-04-23 – 2024-04-24 (×2): qty 30, 30d supply, fill #0

## 2024-04-24 ENCOUNTER — Other Ambulatory Visit (HOSPITAL_BASED_OUTPATIENT_CLINIC_OR_DEPARTMENT_OTHER): Payer: Self-pay

## 2024-04-30 ENCOUNTER — Other Ambulatory Visit (HOSPITAL_BASED_OUTPATIENT_CLINIC_OR_DEPARTMENT_OTHER): Payer: Self-pay

## 2024-05-16 ENCOUNTER — Other Ambulatory Visit (HOSPITAL_BASED_OUTPATIENT_CLINIC_OR_DEPARTMENT_OTHER): Payer: Self-pay

## 2024-05-16 MED ORDER — MELOXICAM 7.5 MG PO TABS
7.5000 mg | ORAL_TABLET | Freq: Every day | ORAL | 2 refills | Status: AC | PRN
  Filled 2024-05-16: qty 30, 30d supply, fill #0

## 2024-05-23 ENCOUNTER — Encounter: Payer: Self-pay | Admitting: Physician Assistant

## 2024-05-25 ENCOUNTER — Encounter (HOSPITAL_BASED_OUTPATIENT_CLINIC_OR_DEPARTMENT_OTHER): Payer: Self-pay

## 2024-05-25 ENCOUNTER — Other Ambulatory Visit (HOSPITAL_BASED_OUTPATIENT_CLINIC_OR_DEPARTMENT_OTHER): Payer: Self-pay

## 2024-05-25 ENCOUNTER — Other Ambulatory Visit: Payer: Self-pay | Admitting: Physician Assistant

## 2024-05-25 MED ORDER — ZEPBOUND 2.5 MG/0.5ML ~~LOC~~ SOAJ
2.5000 mg | SUBCUTANEOUS | 0 refills | Status: AC
  Filled 2024-05-25: qty 2, 28d supply, fill #0

## 2024-10-03 ENCOUNTER — Ambulatory Visit: Admitting: Physician Assistant
